# Patient Record
Sex: Female | Born: 1935 | Race: Black or African American | Hispanic: No | Smoking: Former smoker
Health system: Southern US, Community
[De-identification: ages and names within clinical notes are randomized; demographics above are authoritative.]

## PROBLEM LIST (undated history)

## (undated) DIAGNOSIS — K219 Gastro-esophageal reflux disease without esophagitis: Secondary | ICD-10-CM

## (undated) DIAGNOSIS — I4891 Unspecified atrial fibrillation: Secondary | ICD-10-CM

## (undated) DIAGNOSIS — R011 Cardiac murmur, unspecified: Secondary | ICD-10-CM

## (undated) DIAGNOSIS — E785 Hyperlipidemia, unspecified: Secondary | ICD-10-CM

## (undated) DIAGNOSIS — I10 Essential (primary) hypertension: Secondary | ICD-10-CM

## (undated) DIAGNOSIS — E43 Unspecified severe protein-calorie malnutrition: Secondary | ICD-10-CM

## (undated) DIAGNOSIS — J189 Pneumonia, unspecified organism: Secondary | ICD-10-CM

## (undated) DIAGNOSIS — I509 Heart failure, unspecified: Secondary | ICD-10-CM

## (undated) DIAGNOSIS — R0602 Shortness of breath: Secondary | ICD-10-CM

## (undated) DIAGNOSIS — I214 Non-ST elevation (NSTEMI) myocardial infarction: Secondary | ICD-10-CM

## (undated) HISTORY — PX: LEG SURGERY: SHX1003

---

## 1997-08-19 ENCOUNTER — Other Ambulatory Visit: Admission: RE | Admit: 1997-08-19 | Discharge: 1997-08-19 | Payer: Self-pay | Admitting: Family Medicine

## 1997-11-02 ENCOUNTER — Ambulatory Visit (HOSPITAL_COMMUNITY): Admission: RE | Admit: 1997-11-02 | Discharge: 1997-11-02 | Payer: Self-pay | Admitting: Internal Medicine

## 1997-12-29 ENCOUNTER — Ambulatory Visit (HOSPITAL_COMMUNITY): Admission: RE | Admit: 1997-12-29 | Discharge: 1997-12-29 | Payer: Self-pay | Admitting: Family Medicine

## 1997-12-29 ENCOUNTER — Encounter: Payer: Self-pay | Admitting: Family Medicine

## 2001-05-21 ENCOUNTER — Ambulatory Visit (HOSPITAL_COMMUNITY): Admission: RE | Admit: 2001-05-21 | Discharge: 2001-05-21 | Payer: Self-pay | Admitting: Family Medicine

## 2001-07-05 ENCOUNTER — Encounter: Payer: Self-pay | Admitting: Family Medicine

## 2001-07-05 ENCOUNTER — Ambulatory Visit (HOSPITAL_COMMUNITY): Admission: RE | Admit: 2001-07-05 | Discharge: 2001-07-05 | Payer: Self-pay | Admitting: Family Medicine

## 2001-07-18 ENCOUNTER — Ambulatory Visit (HOSPITAL_COMMUNITY): Admission: RE | Admit: 2001-07-18 | Discharge: 2001-07-18 | Payer: Self-pay | Admitting: Internal Medicine

## 2001-07-18 ENCOUNTER — Encounter: Payer: Self-pay | Admitting: Internal Medicine

## 2001-12-31 ENCOUNTER — Ambulatory Visit (HOSPITAL_COMMUNITY): Admission: RE | Admit: 2001-12-31 | Discharge: 2001-12-31 | Payer: Self-pay | Admitting: Internal Medicine

## 2001-12-31 ENCOUNTER — Encounter: Payer: Self-pay | Admitting: Internal Medicine

## 2002-02-18 ENCOUNTER — Ambulatory Visit (HOSPITAL_COMMUNITY): Admission: RE | Admit: 2002-02-18 | Discharge: 2002-02-18 | Payer: Self-pay | Admitting: Internal Medicine

## 2003-12-17 ENCOUNTER — Ambulatory Visit: Payer: Self-pay | Admitting: Internal Medicine

## 2004-02-04 ENCOUNTER — Ambulatory Visit: Payer: Self-pay | Admitting: Family Medicine

## 2004-03-02 ENCOUNTER — Ambulatory Visit: Payer: Self-pay | Admitting: Family Medicine

## 2004-03-10 ENCOUNTER — Ambulatory Visit: Payer: Self-pay | Admitting: Family Medicine

## 2004-04-22 ENCOUNTER — Emergency Department (HOSPITAL_COMMUNITY): Admission: EM | Admit: 2004-04-22 | Discharge: 2004-04-22 | Payer: Self-pay | Admitting: Emergency Medicine

## 2004-05-30 ENCOUNTER — Ambulatory Visit: Payer: Self-pay | Admitting: Family Medicine

## 2004-06-21 ENCOUNTER — Ambulatory Visit: Payer: Self-pay | Admitting: Family Medicine

## 2004-08-25 ENCOUNTER — Ambulatory Visit: Payer: Self-pay | Admitting: Family Medicine

## 2005-02-20 ENCOUNTER — Ambulatory Visit: Payer: Self-pay | Admitting: Family Medicine

## 2005-03-22 ENCOUNTER — Ambulatory Visit: Payer: Self-pay | Admitting: Family Medicine

## 2005-05-22 ENCOUNTER — Ambulatory Visit: Payer: Self-pay | Admitting: Family Medicine

## 2005-09-20 ENCOUNTER — Ambulatory Visit: Payer: Self-pay | Admitting: Family Medicine

## 2005-10-16 ENCOUNTER — Ambulatory Visit: Payer: Self-pay | Admitting: Family Medicine

## 2007-03-06 ENCOUNTER — Other Ambulatory Visit: Admission: RE | Admit: 2007-03-06 | Discharge: 2007-03-06 | Payer: Self-pay | Admitting: Gynecology

## 2008-03-12 ENCOUNTER — Ambulatory Visit: Payer: Self-pay | Admitting: Gynecology

## 2008-09-11 ENCOUNTER — Inpatient Hospital Stay (HOSPITAL_COMMUNITY): Admission: EM | Admit: 2008-09-11 | Discharge: 2008-09-16 | Payer: Self-pay | Admitting: Emergency Medicine

## 2010-07-11 LAB — URINALYSIS, MICROSCOPIC ONLY
Glucose, UA: NEGATIVE mg/dL
Protein, ur: NEGATIVE mg/dL
Specific Gravity, Urine: 1.003 — ABNORMAL LOW (ref 1.005–1.030)
Urobilinogen, UA: 0.2 mg/dL (ref 0.0–1.0)

## 2010-07-11 LAB — COMPREHENSIVE METABOLIC PANEL
AST: 27 U/L (ref 0–37)
Albumin: 3.8 g/dL (ref 3.5–5.2)
Alkaline Phosphatase: 70 U/L (ref 39–117)
Chloride: 104 mEq/L (ref 96–112)
Creatinine, Ser: 1.78 mg/dL — ABNORMAL HIGH (ref 0.4–1.2)
GFR calc Af Amer: 33 mL/min — ABNORMAL LOW (ref >60)
Potassium: 3.6 mEq/L (ref 3.5–5.1)
Total Bilirubin: 0.5 mg/dL (ref 0.3–1.2)
Total Protein: 8.3 g/dL (ref 6.0–8.3)

## 2010-07-11 LAB — BASIC METABOLIC PANEL
BUN: 19 mg/dL (ref 6–23)
CO2: 27 mEq/L (ref 19–32)
CO2: 28 mEq/L (ref 19–32)
Calcium: 8.4 mg/dL (ref 8.4–10.5)
Calcium: 9 mg/dL (ref 8.4–10.5)
Chloride: 101 mEq/L (ref 96–112)
Chloride: 103 mEq/L (ref 96–112)
Creatinine, Ser: 0.92 mg/dL (ref 0.4–1.2)
Creatinine, Ser: 1.04 mg/dL (ref 0.4–1.2)
GFR calc Af Amer: >60 mL/min (ref >60)
GFR calc non Af Amer: 58 mL/min — ABNORMAL LOW (ref >60)
GFR calc non Af Amer: >60 mL/min (ref >60)
Glucose, Bld: 107 mg/dL — ABNORMAL HIGH (ref 70–99)
Glucose, Bld: 116 mg/dL — ABNORMAL HIGH (ref 70–99)
Potassium: 3.8 mEq/L (ref 3.5–5.1)
Sodium: 136 mEq/L (ref 135–145)
Sodium: 138 mEq/L (ref 135–145)

## 2010-07-11 LAB — HEMOGLOBIN AND HEMATOCRIT, BLOOD
HCT: 26.5 % — ABNORMAL LOW (ref 36.0–46.0)
HCT: 26.5 % — ABNORMAL LOW (ref 36.0–46.0)
HCT: 30 % — ABNORMAL LOW (ref 36.0–46.0)
Hemoglobin: 9.2 g/dL — ABNORMAL LOW (ref 12.0–15.0)
Hemoglobin: 9.4 g/dL — ABNORMAL LOW (ref 12.0–15.0)

## 2010-07-11 LAB — URINALYSIS, ROUTINE W REFLEX MICROSCOPIC
Glucose, UA: NEGATIVE mg/dL
Ketones, ur: NEGATIVE mg/dL
Nitrite: NEGATIVE
Specific Gravity, Urine: 1.014 (ref 1.005–1.030)
pH: 6 (ref 5.0–8.0)

## 2010-07-11 LAB — PROTIME-INR
INR: 1.8 — ABNORMAL HIGH (ref 0.00–1.49)
INR: 1.3 (ref 0.00–1.49)
Prothrombin Time: 21.4 seconds — ABNORMAL HIGH (ref 11.6–15.2)
Prothrombin Time: 16.8 seconds — ABNORMAL HIGH (ref 11.6–15.2)
Prothrombin Time: 20.4 seconds — ABNORMAL HIGH (ref 11.6–15.2)

## 2010-07-11 LAB — DIFFERENTIAL
Basophils Absolute: 0 10*3/uL (ref 0.0–0.1)
Eosinophils Relative: 1 % (ref 0–5)
Lymphocytes Relative: 17 % (ref 12–46)
Monocytes Absolute: 0.5 10*3/uL (ref 0.1–1.0)
Monocytes Relative: 7 % (ref 3–12)

## 2010-07-11 LAB — TYPE AND SCREEN
ABO/RH(D): A POS
Antibody Screen: NEGATIVE

## 2010-07-11 LAB — URINE CULTURE

## 2010-07-11 LAB — FOLATE: Folate: >20 ng/mL

## 2010-07-11 LAB — CBC
MCHC: 34.4 g/dL (ref 30.0–36.0)
Platelets: 171 10*3/uL (ref 150–400)
Platelets: 215 10*3/uL (ref 150–400)
RDW: 12 % (ref 11.5–15.5)
RDW: 12.9 % (ref 11.5–15.5)
WBC: 6.9 10*3/uL (ref 4.0–10.5)

## 2010-07-11 LAB — URINE MICROSCOPIC-ADD ON

## 2010-08-16 NOTE — Consult Note (Signed)
NAMEMarland Hale  Traci, Hale             ACCOUNT NO.:  [id number]   MEDICAL RECORD NO.:  [id number]          PATIENT TYPE:  EMS   LOCATION:  ED                           FACILITY:  [facility]   PHYSICIAN:  Hollice Espy, M.D.DATE OF BIRTH:  04-24-1924   DATE OF CONSULTATION:  09/11/2008  DATE OF DISCHARGE:                                 CONSULTATION   ATTENDING PHYSICIAN:  Dr. Annell Greening, orthopedic surgery.  The patient's  PCP is Christiana Fuchs, nurse practitioner of Deboraha Sprang at [facility]  overseen by Dr. Joselyn Arrow.   REASON FOR CONSULTATION:  Medical clearance.   HISTORY OF PRESENT ILLNESS:  The patient is an 77 year old African  American female with past medical history of hypertension and GERD who  today had been lying out in the sun for some time when she says she felt  very tired. When she tried to stand up she was so weak that she fell  over landing on her left side.  She is having severe sharp pain over her  left hip area and could not move. Paramedics were called.  The patient  was brought in. X-rays were done of the left hip noting a left femoral  intertrochanteric fracture. She also had some blood work done noting a  normal white count and MCV of 103 and elevated BUN and creatinine of 27  and 1.78.  Coags were unremarkable.  With these findings it was felt the  patient needed a hip repair and Dr. Chamille Charter from orthopedic surgery who  was on call was called. He requested a hospitalist consult for medical  clearance.  When I saw the patient she was complaining of some mild left  hip pain.  She otherwise was doing okay.  She denies any headaches,  vision changes, dysphagia. No chest pain, palpitations, shortness of  breath, wheezes, cough, abdominal pain, hematuria, dysuria,  constipation, diarrhea, focal extremity numbness, weakness or pain other  than described above.   REVIEW OF SYSTEMS:  Otherwise negative.  She tells me her baseline is  that she ambulates well with a cane  and no chest pain.   PAST MEDICAL HISTORY:  Includes hypertension, hyperlipidemia and GERD.   MEDICATIONS:  She is on Zocor 40, Prilosec 20, triamterene/HCTZ 75/50, K-  Dur 10, and nifedipine 90.   ALLERGIES:  She has no known drug allergies.   SOCIAL HISTORY:  Denies any tobacco, alcohol or drug use.  Lives at  home.   FAMILY HISTORY:  Noncontributory.   PHYSICAL EXAMINATION:  VITALS:  On admission temperature 99, heart rate  96, blood pressure 141/69, respirations 18, O2 sat 98% on room air.  GENERAL:  She is alert and oriented x3 in no apparent distress.  HEENT:  Normocephalic, atraumatic.  Mucous membranes are slightly dry.  She has no carotid bruits.  HEART:  Regular rate and rhythm.  S1-S2 with  2/6 systolic ejection murmur.  LUNGS:  Clear to auscultation bilaterally.  ABDOMEN:  Soft, nontender, nondistended.  Positive bowel sounds.  EXTREMITIES:  No clubbing, cyanosis or edema.  MUSCULOSKELETAL:  I have deferred a musculoskeletal exam secondary to  her hip fracture.   LAB WORK:  White count 6.9, H and H 13 and 39, MCV of 103, platelet  count 215, 75% neutrophils.  Urinalysis pending.  Coags unremarkable.  Sodium 139, potassium 3.6, chloride 104, bicarb 23, BUN 27, creatinine  1.8, glucose 160.  LFTs are normal.   ASSESSMENT AND PLAN:  1. Hip fracture.  The patient is medically cleared for the operating      room.  2. Acute renal failure secondary to dehydration and likely from being      out in the sun in addition combined with fall. Recommend gentle IV      hydration.  3. Hypertension.  4. Gastroesophageal reflux disease.  5. Cardiac enlargement seen on x-ray, not a contraindication for      surgery.  6. Hyperlipidemia.  7. Macrocytosis. Check B12 and folate.      Hollice Espy, M.D.  Electronically Signed     SKK/MEDQ  D:  09/11/2008  T:  09/11/2008  Job:  [id number]   cc:   Veverly Fells. Kenyette Charter, M.D.  Fax: [phone]   for Dr. Joselyn Arrow. Christiana Fuchs,  NP

## 2010-08-16 NOTE — Discharge Summary (Signed)
NAMEMarland Kitchen  Traci, Hale             ACCOUNT NO.:  [id number]   MEDICAL RECORD NO.:  [id number]          PATIENT TYPE:  INP   LOCATION:  1615                         FACILITY:  [facility]   PHYSICIAN:  [name], M.D.    DATE OF BIRTH:  1924/12/14   DATE OF ADMISSION:  09/11/2008  DATE OF DISCHARGE:  09/16/2008                               DISCHARGE SUMMARY   FINAL DIAGNOSIS:  Left intertrochanteric subtrochanteric  hip fracture.   ADDITIONAL DIAGNOSIS:  1. Hypertension.  2. Hyperlipidemia.  3. Gastroesophageal reflux disease.   CONSULTATIONS:  [facility] hospitalist, Dr. Virginia Hale   OPERATIONS AND PROCEDURES:  Left Synthes intramedullary hip screw 13 x  300 mm long on September 12, 2008.   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  She had some elevation in creatinine and BUN with creatinine 1.78 and  BUN of 27 which resolved with some hydration.  After medical clearance  she was taken to the operating room on September 12, 2008 the following  morning and underwent intramedullary hip screw fixation.  Estimated  blood loss 150 cc.  Postoperatively she was on pharmacy Coumadin  protocol, weightbearing as tolerated and seen by PT and OT.  There was  some question about possible UTI with recent urinary tract infection.  Her urine showed 7 to 10 WBCs, few bacteria, too numerous to count red  cells and negative nitrite test.  Renal status returned to normal.  Hemoglobin was 9.4 postoperative.  She did have some postop anemia  secondary to intraoperative blood loss or acute blood loss anemia.  Arrangements were made for home health care.  Urine culture was obtained  which showed 60,000 E-coli.  She was placed on nitrofurantoin.  It was  recommended that her triamterene HCTZ be stopped unless her blood  pressure increased and if it did then metoprolol 25-50 mg p.o. b.i.d.  was recommended by Dr.  Dimas Hale.  INR was 1.8  on 06/16 and she  was making progress with transfers and weightbearing as tolerated with  physical therapy.  Staples in her hip need to be removed at 2 weeks  postop.  She can see Dr. Asmara Hale in 4 weeks.  Plan is for postop Coumadin  for 4 weeks from the date of her surgery which was September 12, 2008 and  then it will be stopped.   CONDITION ON DISCHARGE:  Satisfactory.      Mark C. Phala Hale, M.D.  Electronically Signed     MCY/MEDQ  D:  09/16/2008  T:  09/16/2008  Job:  [id number]

## 2010-08-16 NOTE — Op Note (Signed)
NAMEMarland Kitchen  Traci Hale, Traci Hale             ACCOUNT NO.:  [id number]   MEDICAL RECORD NO.:  [id number]          PATIENT TYPE:  INP   LOCATION:  1534                         [facility]ACILITY:  [facility]   PHYSICIAN:  Mark C. Keeley Charter, M.D.    DATE OF BIRTH:  Aug 02, 1924   DATE OF PROCEDURE:  09/12/2008  DATE OF DISCHARGE:                               OPERATIVE REPORT   PREOPERATIVE DIAGNOSIS:  Left intertrochanteric subtrochanteric  fracture.   POSTOPERATIVE DIAGNOSIS:  Left intertrochanteric subtrochanteric hip  fracture.   PROCEDURE:  Left intramedullary hip screw Synthes 18 x 300 mm with 100  mm locked lag screw.   SURGEON:  Eldred Manges, MD   ANESTHESIA:  GET 150 mL plus Marcaine local 10 mL.   DRAINS:  None.   PROCEDURE IN DETAIL:  Induction general anesthesia, orotracheal  intubation, preoperative Ancef prophylaxis 1 gram, surgical time-out  procedure once the patient was positioned on the fracture table with the  left lower extremity distracted internal rotation for lateralization of  the trochanter, post and well leg holder appropriately positioned with  careful padding over the peroneal nerve, reduction was performed with  traction and internal rotation, checked under fluoro where alignment was  near anatomic.  Area was prepped with DuraPrep, squared with towels,  skin stapler and drapes only and large shower curtain, Betadine bio  drape.  Surgical checklist was completed prior to incision.  Incision  was made starting at the greater trochanter extending proximally.  Gluteus medius fascia was split.  Palpation of the piriformis fossa.  Placement of the tip over the guide pin and the trochanter, checked  under fluoroscopy and then started.  Over reaming measurement prior to  prepping and draping with a ruler overlying the thigh selecting a 300 mm  length.  Rod was inserted.  Some lateral pressure mid femur had to be  applied to help with alignment in order to get the rod to cross the  fracture site since it kept hanging up on the cortex.  It was passed  distally down to the knee even with the midportion of the patella.  With  appropriate position on AP x-ray lateral attachment was placed.  Small  incision was made laterally in the skin and iliotibial band and then  screw tightened until it was against the lateral cortex.  K-wire was  drilled up center center within 3 mm of the center center position,  measured 105, 100 mm screw was selected.  Lateral cortex drilled, screw  placed, tapped into place and then locked down.  The side arm was  removed.  Spot films were taken at the conclusion, confirming excellent  position and alignment, after irrigation with saline solution both  incisions tensor fascia was closed with #1 Vicryl, 2-0 Vicryl  subcutaneous tissue, Marcaine infiltration.  Skin stapled closure,  postop dressing and transferred to the recovery room.  Instrument count  and needle count was correct.  Surgical time-out was completed prior to  the patient leaving the room.  There were no equipment problems.     Mark C. Danely Charter, M.D.  Electronically Signed  MCY/MEDQ  D:  09/12/2008  T:  09/12/2008  Job:  [id number]

## 2010-09-10 ENCOUNTER — Other Ambulatory Visit: Payer: Self-pay | Admitting: Family Medicine

## 2010-09-12 NOTE — Telephone Encounter (Signed)
This request was in the pool. No chart found, no appt scheduled. 

## 2011-03-24 ENCOUNTER — Other Ambulatory Visit: Payer: Self-pay | Admitting: Family Medicine

## 2011-03-24 DIAGNOSIS — R0989 Other specified symptoms and signs involving the circulatory and respiratory systems: Secondary | ICD-10-CM

## 2011-03-24 DIAGNOSIS — R42 Dizziness and giddiness: Secondary | ICD-10-CM

## 2011-03-24 DIAGNOSIS — E785 Hyperlipidemia, unspecified: Secondary | ICD-10-CM

## 2011-11-06 ENCOUNTER — Other Ambulatory Visit: Payer: Self-pay | Admitting: Family Medicine

## 2011-11-06 DIAGNOSIS — I639 Cerebral infarction, unspecified: Secondary | ICD-10-CM

## 2012-03-25 ENCOUNTER — Other Ambulatory Visit (HOSPITAL_COMMUNITY)
Admission: RE | Admit: 2012-03-25 | Discharge: 2012-03-25 | Disposition: A | Discharge status: Home / Self Care | Payer: Medicare Other | Source: Ambulatory Visit | Attending: Family Medicine | Admitting: Family Medicine

## 2012-03-25 DIAGNOSIS — Z124 Encounter for screening for malignant neoplasm of cervix: Secondary | ICD-10-CM | POA: Insufficient documentation

## 2014-01-08 ENCOUNTER — Encounter (HOSPITAL_COMMUNITY): Payer: Self-pay | Admitting: Emergency Medicine

## 2014-01-08 ENCOUNTER — Inpatient Hospital Stay (HOSPITAL_COMMUNITY)
Admission: EM | Admit: 2014-01-08 | Discharge: 2014-01-10 | DRG: 292 | Disposition: A | Discharge status: Home Health | Payer: Medicare HMO | Attending: Internal Medicine | Admitting: Internal Medicine

## 2014-01-08 ENCOUNTER — Emergency Department (HOSPITAL_COMMUNITY): Payer: Medicare HMO

## 2014-01-08 DIAGNOSIS — I129 Hypertensive chronic kidney disease with stage 1 through stage 4 chronic kidney disease, or unspecified chronic kidney disease: Secondary | ICD-10-CM | POA: Diagnosis present

## 2014-01-08 DIAGNOSIS — Z66 Do not resuscitate: Secondary | ICD-10-CM | POA: Diagnosis present

## 2014-01-08 DIAGNOSIS — Z79899 Other long term (current) drug therapy: Secondary | ICD-10-CM | POA: Diagnosis not present

## 2014-01-08 DIAGNOSIS — I248 Other forms of acute ischemic heart disease: Secondary | ICD-10-CM | POA: Diagnosis present

## 2014-01-08 DIAGNOSIS — M19049 Primary osteoarthritis, unspecified hand: Secondary | ICD-10-CM | POA: Diagnosis present

## 2014-01-08 DIAGNOSIS — E876 Hypokalemia: Secondary | ICD-10-CM | POA: Diagnosis present

## 2014-01-08 DIAGNOSIS — E785 Hyperlipidemia, unspecified: Secondary | ICD-10-CM | POA: Diagnosis present

## 2014-01-08 DIAGNOSIS — R0602 Shortness of breath: Secondary | ICD-10-CM | POA: Diagnosis not present

## 2014-01-08 DIAGNOSIS — I509 Heart failure, unspecified: Secondary | ICD-10-CM

## 2014-01-08 DIAGNOSIS — Z87891 Personal history of nicotine dependence: Secondary | ICD-10-CM

## 2014-01-08 DIAGNOSIS — N183 Chronic kidney disease, stage 3 unspecified: Secondary | ICD-10-CM

## 2014-01-08 DIAGNOSIS — I5043 Acute on chronic combined systolic (congestive) and diastolic (congestive) heart failure: Principal | ICD-10-CM | POA: Diagnosis present

## 2014-01-08 DIAGNOSIS — R5381 Other malaise: Secondary | ICD-10-CM

## 2014-01-08 DIAGNOSIS — I10 Essential (primary) hypertension: Secondary | ICD-10-CM | POA: Diagnosis present

## 2014-01-08 HISTORY — DX: Shortness of breath: R06.02

## 2014-01-08 HISTORY — DX: Heart failure, unspecified: I50.9

## 2014-01-08 HISTORY — DX: Gastro-esophageal reflux disease without esophagitis: K21.9

## 2014-01-08 HISTORY — DX: Essential (primary) hypertension: I10

## 2014-01-08 HISTORY — DX: Hyperlipidemia, unspecified: E78.5

## 2014-01-08 HISTORY — DX: Cardiac murmur, unspecified: R01.1

## 2014-01-08 LAB — CBC
HEMATOCRIT: 32.4 % — AB (ref 36.0–46.0)
HEMATOCRIT: 36.8 % (ref 36.0–46.0)
HEMOGLOBIN: 12.6 g/dL (ref 12.0–15.0)
Hemoglobin: 10.9 g/dL — ABNORMAL LOW (ref 12.0–15.0)
MCH: 33.5 pg (ref 26.0–34.0)
MCH: 34.1 pg — ABNORMAL HIGH (ref 26.0–34.0)
MCHC: 33.6 g/dL (ref 30.0–36.0)
MCHC: 34.2 g/dL (ref 30.0–36.0)
MCV: 99.7 fL (ref 78.0–100.0)
MCV: 99.5 fL (ref 78.0–100.0)
Platelets: 327 10*3/uL (ref 150–400)
Platelets: 267 10*3/uL (ref 150–400)
RBC: 3.25 MIL/uL — AB (ref 3.87–5.11)
RBC: 3.7 MIL/uL — ABNORMAL LOW (ref 3.87–5.11)
RDW: 13.5 % (ref 11.5–15.5)
RDW: 13.4 % (ref 11.5–15.5)
WBC: 4.2 10*3/uL (ref 4.0–10.5)
WBC: 3 10*3/uL — ABNORMAL LOW (ref 4.0–10.5)

## 2014-01-08 LAB — BASIC METABOLIC PANEL
Anion gap: 14 (ref 5–15)
BUN: 13 mg/dL (ref 6–23)
CHLORIDE: 105 meq/L (ref 96–112)
CO2: 24 meq/L (ref 19–32)
CREATININE: 0.73 mg/dL (ref 0.50–1.10)
Calcium: 9.7 mg/dL (ref 8.4–10.5)
GFR calc Af Amer: 87 mL/min — ABNORMAL LOW (ref >90)
GFR calc non Af Amer: 75 mL/min — ABNORMAL LOW (ref >90)
Glucose, Bld: 97 mg/dL (ref 70–99)
Potassium: 3.6 mEq/L — ABNORMAL LOW (ref 3.7–5.3)
Sodium: 143 mEq/L (ref 137–147)

## 2014-01-08 LAB — CREATININE, SERUM
Creatinine, Ser: 0.71 mg/dL (ref 0.50–1.10)
GFR calc non Af Amer: 75 mL/min — ABNORMAL LOW (ref >90)
GFR, EST AFRICAN AMERICAN: 87 mL/min — AB (ref >90)

## 2014-01-08 LAB — PRO B NATRIURETIC PEPTIDE: Pro B Natriuretic peptide (BNP): 18570 pg/mL — ABNORMAL HIGH (ref 0–450)

## 2014-01-08 LAB — TROPONIN I: Troponin I: <0.3 ng/mL (ref <0.30)

## 2014-01-08 LAB — I-STAT TROPONIN, ED: Troponin i, poc: 0.06 ng/mL (ref 0.00–0.08)

## 2014-01-08 MED ORDER — ASPIRIN EC 81 MG PO TBEC
81.0000 mg | DELAYED_RELEASE_TABLET | Freq: Every day | ORAL | Status: DC
  Administered 2014-01-08 – 2014-01-10 (×3): 81 mg via ORAL
  Filled 2014-01-08 (×3): qty 1

## 2014-01-08 MED ORDER — ACETAMINOPHEN 325 MG PO TABS: 650.0000 mg | ORAL_TABLET | ORAL | Status: DC | PRN

## 2014-01-08 MED ORDER — SODIUM CHLORIDE 0.9 % IJ SOLN
3.0000 mL | INTRAMUSCULAR | Status: DC | PRN
Start: 2014-01-08 — End: 2014-01-10

## 2014-01-08 MED ORDER — HEPARIN SODIUM (PORCINE) 5000 UNIT/ML IJ SOLN
5000.0000 [IU] | Freq: Three times a day (TID) | INTRAMUSCULAR | Status: DC
  Administered 2014-01-08 – 2014-01-10 (×4): 5000 [IU] via SUBCUTANEOUS
  Filled 2014-01-08 (×7): qty 1

## 2014-01-08 MED ORDER — FUROSEMIDE 10 MG/ML IJ SOLN
20.0000 mg | Freq: Once | INTRAMUSCULAR | Status: AC
  Administered 2014-01-08: 20 mg via INTRAVENOUS
  Filled 2014-01-08: qty 2

## 2014-01-08 MED ORDER — SIMVASTATIN 20 MG PO TABS
20.0000 mg | ORAL_TABLET | Freq: Every day | ORAL | Status: DC
  Administered 2014-01-08 – 2014-01-09 (×2): 20 mg via ORAL
  Filled 2014-01-08 (×3): qty 1

## 2014-01-08 MED ORDER — SODIUM CHLORIDE 0.9 % IV SOLN
250.0000 mL | INTRAVENOUS | Status: DC | PRN
Start: 2014-01-08 — End: 2014-01-10

## 2014-01-08 MED ORDER — ONDANSETRON HCL 4 MG/2ML IJ SOLN: 4.0000 mg | Freq: Four times a day (QID) | INTRAMUSCULAR | Status: DC | PRN

## 2014-01-08 MED ORDER — FA-PYRIDOXINE-CYANOCOBALAMIN 2.5-25-2 MG PO TABS
1.0000 | ORAL_TABLET | Freq: Every day | ORAL | Status: DC
  Administered 2014-01-08 – 2014-01-10 (×3): 1 via ORAL
  Filled 2014-01-08 (×3): qty 1

## 2014-01-08 MED ORDER — FOLIC ACID-VIT B6-VIT B12 0.8-10-0.115 MG PO TABS: 1.0000 | ORAL_TABLET | Freq: Every day | ORAL | Status: DC

## 2014-01-08 MED ORDER — SODIUM CHLORIDE 0.9 % IJ SOLN
3.0000 mL | Freq: Two times a day (BID) | INTRAMUSCULAR | Status: DC
  Administered 2014-01-08 – 2014-01-10 (×5): 3 mL via INTRAVENOUS
  Filled 2014-01-08: qty 3

## 2014-01-08 MED ORDER — POTASSIUM CHLORIDE CRYS ER 20 MEQ PO TBCR
40.0000 meq | EXTENDED_RELEASE_TABLET | ORAL | Status: AC
  Administered 2014-01-08 (×2): 40 meq via ORAL
  Filled 2014-01-08 (×3): qty 2

## 2014-01-08 MED ORDER — NIFEDIPINE ER OSMOTIC RELEASE 90 MG PO TB24
90.0000 mg | ORAL_TABLET | Freq: Every day | ORAL | Status: DC
  Administered 2014-01-09: 90 mg via ORAL
  Filled 2014-01-08: qty 1

## 2014-01-08 NOTE — ED Provider Notes (Signed)
CSN: [id number]     Arrival date & time 01/08/14  1114 History   First MD Initiated Contact with Patient 01/08/14 1200     Chief Complaint  Patient presents with  . Shortness of Breath     (Consider location/radiation/quality/duration/timing/severity/associated sxs/prior Treatment) HPI Comments: Patient with history of hypertension and hypercholesterolemia presents with complaint of intermittent shortness of breath that began yesterday. Shortness of breath occurs at rest and with activity. Patient denies any chest pain but family member states yesterday she complained of 'a catch' in her chest. She refused to come to ED at that time. No cough or fever. No lower extremity edema, history of congestive heart failure, new orthopnea. Patient went to her primary care physician today and had an abnormal EKG so she was sent to the emergency department. No other complaints at this time. Patient is currently asymptomatic. No history of heart disease. Family member is uncertain if patient has had a stress test.   Patient is a 80 y.o. female presenting with shortness of breath. The history is provided by the patient and a relative.  Shortness of Breath Associated symptoms: no abdominal pain, no chest pain, no cough, no fever, no headaches, no rash, no sore throat and no vomiting     Past Medical History  Diagnosis Date  . Hypertension    Past Surgical History  Procedure Laterality Date  . Leg surgery     No family history on file. History  Substance Use Topics  . Smoking status: Former Games developer  . Smokeless tobacco: Not on file  . Alcohol Use: Yes   OB History   Grav Para Term Preterm Abortions TAB SAB Ect Mult Living                 Review of Systems  Constitutional: Negative for fever.  HENT: Negative for rhinorrhea and sore throat.   Eyes: Negative for redness.  Respiratory: Positive for shortness of breath. Negative for cough.   Cardiovascular: Negative for chest pain and leg  swelling.  Gastrointestinal: Negative for nausea, vomiting, abdominal pain and diarrhea.  Genitourinary: Negative for dysuria.  Musculoskeletal: Negative for myalgias.  Skin: Negative for rash.  Neurological: Negative for headaches.      Allergies  Review of patient's allergies indicates no known allergies.  Home Medications   Prior to Admission medications   Medication Sig Start Date End Date Taking? Authorizing Provider  Calcium Carbonate-Vitamin D (CALCIUM 600+D) 600-200 MG-UNIT TABS Take 1 tablet by mouth 2 (two) times daily.   Yes Historical Provider, MD  Cholecalciferol (VITAMIN D-3) 5000 UNITS TABS Take 1 tablet by mouth daily.   Yes Historical Provider, MD  Coenzyme Q10 (CO Q 10) 60 MG CAPS Take 60 mg by mouth daily.   Yes Historical Provider, MD  Folic Acid-Vit B6-Vit B12 (FOLGARD) 0.8-10-0.115 MG TABS Take 1 tablet by mouth daily.   Yes Historical Provider, MD  NIFEdipine (PROCARDIA XL/ADALAT-CC) 90 MG 24 hr tablet Take 90 mg by mouth daily.   Yes Historical Provider, MD  Omega 3-6-9 Fatty Acids (OMEGA-3 FUSION) LIQD Take 5 mLs by mouth daily. 1000mg  omega 3 liquid   Yes Historical Provider, MD  Probiotic Product (PROBIOTIC DAILY PO) Take 1 capsule by mouth daily.   Yes Historical Provider, MD  simvastatin (ZOCOR) 20 MG tablet Take 20 mg by mouth daily.   Yes Historical Provider, MD   BP 136/85  Pulse 92  Temp(Src) 98 F (36.7 C) (Oral)  Resp 18  SpO2 99%  Physical Exam  Nursing note and vitals reviewed. Constitutional: She appears well-developed and well-nourished.  HENT:  Head: Normocephalic and atraumatic.  Mouth/Throat: Oropharynx is clear and moist and mucous membranes are normal. Mucous membranes are not dry.  Eyes: Conjunctivae are normal.  Neck: Trachea normal and normal range of motion. Neck supple. JVD (approx 2 cm sitting) present. Normal carotid pulses present. No muscular tenderness present. Carotid bruit is not present. No tracheal deviation present.   Cardiovascular: Normal rate, regular rhythm, S1 normal, S2 normal and intact distal pulses.  Exam reveals no decreased pulses.   Murmur (II/VI systolic murmur inferior LSB) heard. Pulmonary/Chest: Effort normal. No respiratory distress. She has no wheezes. She exhibits no tenderness.  Abdominal: Soft. Normal aorta and bowel sounds are normal. There is no tenderness. There is no rebound and no guarding.  Musculoskeletal: Normal range of motion.  Neurological: She is alert.  Skin: Skin is warm and dry. She is not diaphoretic. No cyanosis. No pallor.  Psychiatric: She has a normal mood and affect.    ED Course  Procedures (including critical care time) Labs Review Labs Reviewed  CBC - Abnormal; Notable for the following:    RBC 3.25 (*)    Hemoglobin 10.9 (*)    HCT 32.4 (*)    All other components within normal limits  BASIC METABOLIC PANEL - Abnormal; Notable for the following:    Potassium 3.6 (*)    GFR calc non Af Amer 75 (*)    GFR calc Af Amer 87 (*)    All other components within normal limits  PRO B NATRIURETIC PEPTIDE - Abnormal; Notable for the following:    Pro B Natriuretic peptide (BNP) 18570.0 (*)    All other components within normal limits  I-STAT TROPOININ, ED    Imaging Review Dg Chest 2 View  01/08/2014   CLINICAL DATA:  Shortness of Breath, persistent  EXAM: CHEST  2 VIEW  COMPARISON:  September 11, 2008  FINDINGS: There is mild cardiac enlargement with pulmonary venous hypertension and trace edema. There is no consolidation. There is extensive atherosclerotic change in the aorta. There is no appreciable adenopathy. Bones are osteoporotic.  IMPRESSION: Findings felt to represent a degree of chronic congestive heart failure. No consolidation. Extensive atherosclerotic change. Bones osteoporotic.   Electronically Signed   By: Bretta BangWilliam  Woodruff M.D.   On: 01/08/2014 13:51     EKG Interpretation   Date/Time:  Thursday January 08 2014 11:19:35 EDT Ventricular Rate:   94 PR Interval:  172 QRS Duration: 76 QT Interval:  384 QTC Calculation: 480 R Axis:   86 Text Interpretation:  Normal sinus rhythm Minimal voltage criteria for  LVH, may be normal variant T wave abnormality, consider inferolateral  ischemia Prolonged QT Abnormal ECG No comparison Confirmed by Fayrene FearingJAMES  MD,  MARK ([id number]) on 01/08/2014 12:50:52 PM      12:33 PM Patient seen and examined. Work-up initiated. Medications ordered. EKG reviewed and is abnormal but no STEMI. No old for comparison.   Vital signs reviewed and are as follows: BP 136/85  Pulse 92  Temp(Src) 98 F (36.7 C) (Oral)  Resp 18  SpO2 99%  2:55 PM CXR hinted at CHF. BNP was added and is elevated. Will admit for new onset CHF.   Patient discussed with Dr. Fayrene FearingJames who has seen. Lasix 20mg  IV ordered. Delta troponin ordered.   3:08 PM Dr. Butler Denmarkizwan to see and admit.    MDM   Final diagnoses:  Acute congestive heart  failure, unspecified congestive heart failure type   Admit.     Renne Crigler, PA-C 01/08/14 [phone]

## 2014-01-08 NOTE — H&P (Signed)
[facility] History and Physical  Traci CopperOphelia G Canniff NWG:[id number]:[id number] DOB: 1924-11-12 DOA: 01/08/2014   PCP: Cain SaupeFULP, CAMMIE, MD    Chief Complaint: shortness of breath  HPI: Traci Hale is a 80 y.o. female with shortness of breath upon laying flat for the past few days. No c/o chest pain, cough or tachycardia. No ankle edema. She has felt to weak to walk around. Her granddaughter found her sitting in the chair trying to catch her breath yesterday - the patient did not want to come to the hospital. No h/o of MI. Only history is HTN and hyperlipidemia and she takes her medications regularly. She is found to have mild CHF changes on xray, significantly elevated proBNP and an abnormal EKG and is being admitted for further treatment and work up. Currently does not appear to be short of breat and has an oxygen level of 99% on room air.    General: The patient denies anorexia, fever, weight loss Cardiac: Denies chest pain, syncope, palpitations, pedal edema  Respiratory: Denies cough, shortness of breath, wheezing GI: Denies severe indigestion/heartburn, abdominal pain, nausea, vomiting, diarrhea + constipation GU: Denies hematuria, incontinence, dysuria  Musculoskeletal: has arthritis in hand Skin: Denies suspicious skin lesions Neurologic: Denies focal weakness or numbness, change in vision- feels that hand tingle all the time  Past Medical History  Diagnosis Date  . Hypertension     Past Surgical History  Procedure Laterality Date  . Leg surgery      Social History: quit smoking 15 yrs ago- occasional ETOH use Lives at home with grandaughter    No Known Allergies  No family history on file.    Prior to Admission medications   Medication Sig Start Date End Date Taking? Authorizing Provider  Calcium Carbonate-Vitamin D (CALCIUM 600+D) 600-200 MG-UNIT TABS Take 1 tablet by mouth 2 (two) times daily.   Yes Historical Provider, MD  Cholecalciferol (VITAMIN D-3) 5000 UNITS  TABS Take 1 tablet by mouth daily.   Yes Historical Provider, MD  Coenzyme Q10 (CO Q 10) 60 MG CAPS Take 60 mg by mouth daily.   Yes Historical Provider, MD  Folic Acid-Vit B6-Vit B12 (FOLGARD) 0.8-10-0.115 MG TABS Take 1 tablet by mouth daily.   Yes Historical Provider, MD  NIFEdipine (PROCARDIA XL/ADALAT-CC) 90 MG 24 hr tablet Take 90 mg by mouth daily.   Yes Historical Provider, MD  Omega 3-6-9 Fatty Acids (OMEGA-3 FUSION) LIQD Take 5 mLs by mouth daily. 1000mg  omega 3 liquid   Yes Historical Provider, MD  Probiotic Product (PROBIOTIC DAILY PO) Take 1 capsule by mouth daily.   Yes Historical Provider, MD  simvastatin (ZOCOR) 20 MG tablet Take 20 mg by mouth daily.   Yes Historical Provider, MD     Physical Exam: Filed Vitals:   01/08/14 1300 01/08/14 1315 01/08/14 1330 01/08/14 1445  BP: 128/79 132/68 137/77 109/74  Pulse: 78 80 87 81  Temp:      TempSrc:      Resp: 18 23 18    SpO2: 99% 100% 97% 98%     General: AAO x 3, no distress HEENT: Normocephalic and Atraumatic, Mucous membranes pink                PERRLA; EOM intact; No scleral icterus,                 Nares: Patent, Oropharynx: Clear, Fair Dentition                 Neck: FROM, no  cervical lymphadenopathy, thyromegaly, carotid bruit or JVD;  Breasts: deferred CHEST WALL: No tenderness  CHEST: Normal respiration, mild crackles at bases bilaterally  HEART: Regular rate and rhythm; no murmurs rubs or gallops  BACK: No kyphosis or scoliosis; no CVA tenderness  ABDOMEN: Positive Bowel Sounds, soft, non-tender; no masses, no organomegaly Rectal Exam: deferred EXTREMITIES: No cyanosis, clubbing, or edema Genitalia: not examined  SKIN:  no rash or ulceration  CNS: Alert and Oriented x 4, Nonfocal exam, CN 2-12 intact  Labs on Admission:  Basic Metabolic Panel:  Recent Labs Lab 01/08/14 1230  NA 143  K 3.6*  CL 105  CO2 24  GLUCOSE 97  BUN 13  CREATININE 0.73  CALCIUM 9.7   Liver Function Tests: No results  found for this basename: AST, ALT, ALKPHOS, BILITOT, PROT, ALBUMIN,  in the last 168 hours No results found for this basename: LIPASE, AMYLASE,  in the last 168 hours No results found for this basename: AMMONIA,  in the last 168 hours CBC:  Recent Labs Lab 01/08/14 1230  WBC 4.2  HGB 10.9*  HCT 32.4*  MCV 99.7  PLT 327   Cardiac Enzymes: No results found for this basename: CKTOTAL, CKMB, CKMBINDEX, TROPONINI,  in the last 168 hours  BNP (last 3 results)  Recent Labs  01/08/14 1401  PROBNP 18570.0*   CBG: No results found for this basename: GLUCAP,  in the last 168 hours  Radiological Exams on Admission: Dg Chest 2 View  01/08/2014   CLINICAL DATA:  Shortness of Breath, persistent  EXAM: CHEST  2 VIEW  COMPARISON:  September 11, 2008  FINDINGS: There is mild cardiac enlargement with pulmonary venous hypertension and trace edema. There is no consolidation. There is extensive atherosclerotic change in the aorta. There is no appreciable adenopathy. Bones are osteoporotic.  IMPRESSION: Findings felt to represent a degree of chronic congestive heart failure. No consolidation. Extensive atherosclerotic change. Bones osteoporotic.   Electronically Signed   By: Bretta Bang M.D.   On: 01/08/2014 13:51    EKG: Independently reviewed. Changes of LVH with sinus rhythm  Assessment/Plan Principal Problem:   CHF (congestive heart failure)- unspecified - with orthopnea- will need to check pulse ox with ambulation and nocturnal oximetry (while laying flat) - will obtain ECHO- given one dose of Lasix in ER- f/u BNP in AM - f/u troponins  Active Problems:   HTN (hypertension) -cont CCB  Hypokalemia - replace K    Hyperlipidemia - cont statin   Consulted: none  Code Status: *DNR Family Communication: grandaughter Meredith Leeds  DVT Prophylaxis: Heparin  Time spent: 45 min  Myan Suit, MD [facility]  If 7PM-7AM, please contact  night-coverage [email or web address] 01/08/2014, 3:32 PM

## 2014-01-08 NOTE — ED Notes (Signed)
Patient states went to her regular doctor today and her EKG was abnormal so they sent her here.   Patient complains of SOB. Patient able to talk in full sentences without difficulty.   No distress noted at this time.

## 2014-01-08 NOTE — Progress Notes (Signed)
Patient Saturations on Room Air at Rest = 99%, HR 85  Patient Saturations on Room Air while Ambulating = 97%, HR 105  Pt walked to nurses station with RN, pt complained of dizziness. Pt placed in wheelchair, BP 153/87. Pt placed back in bed, dizziness resolved. Pt on continuous pulse ox.

## 2014-01-08 NOTE — H&P (Signed)
[facility] History and Physical  Traci Hale NWG:[id number] DOB: 1924-11-12 DOA: 01/08/2014   PCP: Cain SaupeFULP, CAMMIE, MD    Chief Complaint: shortness of breath  HPI: Traci Hale is a 80 y.o. female with shortness of breath upon laying flat for the past few days. No c/o chest pain, cough or tachycardia. No ankle edema. She has felt to weak to walk around. Her granddaughter found her sitting in the chair trying to catch her breath yesterday - the patient did not want to come to the hospital. No h/o of MI. Only history is HTN and hyperlipidemia and she takes her medications regularly. She is found to have mild CHF changes on xray, significantly elevated proBNP and an abnormal EKG and is being admitted for further treatment and work up. Currently does not appear to be short of breat and has an oxygen level of 99% on room air.    General: The patient denies anorexia, fever, weight loss Cardiac: Denies chest pain, syncope, palpitations, pedal edema  Respiratory: Denies cough, shortness of breath, wheezing GI: Denies severe indigestion/heartburn, abdominal pain, nausea, vomiting, diarrhea + constipation GU: Denies hematuria, incontinence, dysuria  Musculoskeletal: has arthritis in hand Skin: Denies suspicious skin lesions Neurologic: Denies focal weakness or numbness, change in vision- feels that hand tingle all the time  Past Medical History  Diagnosis Date  . Hypertension     Past Surgical History  Procedure Laterality Date  . Leg surgery      Social History: quit smoking 15 yrs ago- occasional ETOH use Lives at home with grandaughter    No Known Allergies  No family history on file.    Prior to Admission medications   Medication Sig Start Date End Date Taking? Authorizing Provider  Calcium Carbonate-Vitamin D (CALCIUM 600+D) 600-200 MG-UNIT TABS Take 1 tablet by mouth 2 (two) times daily.   Yes Historical Provider, MD  Cholecalciferol (VITAMIN D-3) 5000 UNITS  TABS Take 1 tablet by mouth daily.   Yes Historical Provider, MD  Coenzyme Q10 (CO Q 10) 60 MG CAPS Take 60 mg by mouth daily.   Yes Historical Provider, MD  Folic Acid-Vit B6-Vit B12 (FOLGARD) 0.8-10-0.115 MG TABS Take 1 tablet by mouth daily.   Yes Historical Provider, MD  NIFEdipine (PROCARDIA XL/ADALAT-CC) 90 MG 24 hr tablet Take 90 mg by mouth daily.   Yes Historical Provider, MD  Omega 3-6-9 Fatty Acids (OMEGA-3 FUSION) LIQD Take 5 mLs by mouth daily. 1000mg  omega 3 liquid   Yes Historical Provider, MD  Probiotic Product (PROBIOTIC DAILY PO) Take 1 capsule by mouth daily.   Yes Historical Provider, MD  simvastatin (ZOCOR) 20 MG tablet Take 20 mg by mouth daily.   Yes Historical Provider, MD     Physical Exam: Filed Vitals:   01/08/14 1300 01/08/14 1315 01/08/14 1330 01/08/14 1445  BP: 128/79 132/68 137/77 109/74  Pulse: 78 80 87 81  Temp:      TempSrc:      Resp: 18 23 18    SpO2: 99% 100% 97% 98%     General: AAO x 3, no distress HEENT: Normocephalic and Atraumatic, Mucous membranes pink                PERRLA; EOM intact; No scleral icterus,                 Nares: Patent, Oropharynx: Clear, Fair Dentition                 Neck: FROM, no  cervical lymphadenopathy, thyromegaly, carotid bruit or JVD;  Breasts: deferred CHEST WALL: No tenderness  CHEST: Normal respiration, mild crackles at bases bilaterally  HEART: Regular rate and rhythm; no murmurs rubs or gallops  BACK: No kyphosis or scoliosis; no CVA tenderness  ABDOMEN: Positive Bowel Sounds, soft, non-tender; no masses, no organomegaly Rectal Exam: deferred EXTREMITIES: No cyanosis, clubbing, or edema Genitalia: not examined  SKIN:  no rash or ulceration  CNS: Alert and Oriented x 4, Nonfocal exam, CN 2-12 intact  Labs on Admission:  Basic Metabolic Panel:  Recent Labs Lab 01/08/14 1230  NA 143  K 3.6*  CL 105  CO2 24  GLUCOSE 97  BUN 13  CREATININE 0.73  CALCIUM 9.7   Liver Function Tests: No results  found for this basename: AST, ALT, ALKPHOS, BILITOT, PROT, ALBUMIN,  in the last 168 hours No results found for this basename: LIPASE, AMYLASE,  in the last 168 hours No results found for this basename: AMMONIA,  in the last 168 hours CBC:  Recent Labs Lab 01/08/14 1230  WBC 4.2  HGB 10.9*  HCT 32.4*  MCV 99.7  PLT 327   Cardiac Enzymes: No results found for this basename: CKTOTAL, CKMB, CKMBINDEX, TROPONINI,  in the last 168 hours  BNP (last 3 results)  Recent Labs  01/08/14 1401  PROBNP 18570.0*   CBG: No results found for this basename: GLUCAP,  in the last 168 hours  Radiological Exams on Admission: Dg Chest 2 View  01/08/2014   CLINICAL DATA:  Shortness of Breath, persistent  EXAM: CHEST  2 VIEW  COMPARISON:  September 11, 2008  FINDINGS: There is mild cardiac enlargement with pulmonary venous hypertension and trace edema. There is no consolidation. There is extensive atherosclerotic change in the aorta. There is no appreciable adenopathy. Bones are osteoporotic.  IMPRESSION: Findings felt to represent a degree of chronic congestive heart failure. No consolidation. Extensive atherosclerotic change. Bones osteoporotic.   Electronically Signed   By: Bretta BangWilliam  Woodruff M.D.   On: 01/08/2014 13:51    EKG: Independently reviewed. Changes of LVH with sinus rhythm  Assessment/Plan Principal Problem:   CHF (congestive heart failure)- unspecified - with orthopnea- will need to check pulse ox with ambulation and nocturnal oximetry (while laying flat) - will obtain ECHO- given one dose of Lasix in ER- f/u BNP in AM - f/u troponins  Active Problems: Mild POC troponin elevation - if second set positive, will call cardiology - add ASA    HTN (hypertension) -cont CCB  Hypokalemia - replace K    Hyperlipidemia - cont statin   Consulted: none  Code Status: *DNR Family Communication: [name]  DVT Prophylaxis: Heparin  Time spent: 45 min  Amra Shukla,  MD [facility]  If 7PM-7AM, please contact night-coverage [email or web address] 01/08/2014, 3:36 PM

## 2014-01-09 DIAGNOSIS — E785 Hyperlipidemia, unspecified: Secondary | ICD-10-CM

## 2014-01-09 DIAGNOSIS — I509 Heart failure, unspecified: Secondary | ICD-10-CM

## 2014-01-09 DIAGNOSIS — I059 Rheumatic mitral valve disease, unspecified: Secondary | ICD-10-CM

## 2014-01-09 DIAGNOSIS — I5043 Acute on chronic combined systolic (congestive) and diastolic (congestive) heart failure: Principal | ICD-10-CM

## 2014-01-09 DIAGNOSIS — N183 Chronic kidney disease, stage 3 (moderate): Secondary | ICD-10-CM

## 2014-01-09 DIAGNOSIS — I10 Essential (primary) hypertension: Secondary | ICD-10-CM

## 2014-01-09 LAB — BASIC METABOLIC PANEL
Anion gap: 13 (ref 5–15)
BUN: 14 mg/dL (ref 6–23)
CHLORIDE: 106 meq/L (ref 96–112)
CO2: 26 meq/L (ref 19–32)
CREATININE: 0.75 mg/dL (ref 0.50–1.10)
Calcium: 9.1 mg/dL (ref 8.4–10.5)
GFR calc non Af Amer: 74 mL/min — ABNORMAL LOW (ref >90)
GFR, EST AFRICAN AMERICAN: 86 mL/min — AB (ref >90)
Glucose, Bld: 95 mg/dL (ref 70–99)
POTASSIUM: 4.9 meq/L (ref 3.7–5.3)
Sodium: 145 mEq/L (ref 137–147)

## 2014-01-09 LAB — TROPONIN I: Troponin I: 0.39 ng/mL (ref <0.30)

## 2014-01-09 LAB — PRO B NATRIURETIC PEPTIDE: Pro B Natriuretic peptide (BNP): 17819 pg/mL — ABNORMAL HIGH (ref 0–450)

## 2014-01-09 MED ORDER — CARVEDILOL 6.25 MG PO TABS
6.2500 mg | ORAL_TABLET | Freq: Two times a day (BID) | ORAL | Status: DC
  Administered 2014-01-09 – 2014-01-10 (×2): 6.25 mg via ORAL
  Filled 2014-01-09 (×4): qty 1

## 2014-01-09 MED ORDER — FUROSEMIDE 20 MG PO TABS
20.0000 mg | ORAL_TABLET | Freq: Every day | ORAL | Status: DC
  Administered 2014-01-09 – 2014-01-10 (×2): 20 mg via ORAL
  Filled 2014-01-09 (×2): qty 1

## 2014-01-09 MED ORDER — ENSURE COMPLETE PO LIQD
237.0000 mL | Freq: Two times a day (BID) | ORAL | Status: DC
  Administered 2014-01-09 – 2014-01-10 (×3): 237 mL via ORAL

## 2014-01-09 MED ORDER — LISINOPRIL 5 MG PO TABS
5.0000 mg | ORAL_TABLET | Freq: Every day | ORAL | Status: DC
  Administered 2014-01-09 – 2014-01-10 (×2): 5 mg via ORAL
  Filled 2014-01-09 (×2): qty 1

## 2014-01-09 NOTE — Progress Notes (Signed)
Echocardiogram 2D Echocardiogram has been performed.  [name], [name] M 01/09/2014, 10:17 AM

## 2014-01-09 NOTE — Progress Notes (Signed)
[facility] PROGRESS NOTE Interim History: 80 y.o. female with shortness of breath upon laying flat for the past few days. No c/o chest pain, cough or tachycardia. No ankle edema. She has felt to weak to walk around. Her granddaughter found her sitting in the chair trying to catch her breath yesterday - the patient did not want to come to the hospital. No h/o of MI. Only history is HTN and hyperlipidemia and she takes her medications regularly. She is found to have mild CHF changes on xray, significantly elevated proBNP and an abnormal EKG and is being admitted for further treatment and work up. Currently does not appear to be short of breat and has an oxygen level of 99% on room air.    Filed Weights   01/08/14 1654 01/09/14 0500  Weight: 50.984 kg (112 lb 6.4 oz) 49.986 kg (110 lb 3.2 oz)        Intake/Output Summary (Last 24 hours) at 01/09/14 1603 Last data filed at 01/09/14 1418  Gross per 24 hour  Intake    720 ml  Output   2210 ml  Net  -1490 ml     Assessment/Plan: 1-acute on chronic combined CHF (congestive heart failure): EF 35-40% -mild elevation of troponin w/o CP; most likely demand ischemia -troponin now back to normal -patient denies further SOB -will start coreg, lisinopril and lasix PO -follow electrolytes and renal function -daily weight, strict intake and output, low sodium diet -good urine output and approx almost pounds   2-HLD: continue statins   3-HTN (hypertension): slightly elevated -will follow BP with new drugs added  4-weakness: will ask PT to evaluate  5-chronic renal failure (stage 3): at baseline. -will monitor with diuresis and initiation of lisinopril   Code Status: DNR Family Communication: daughter at bedside Disposition Plan: home in am most likely.   Consultants:  None   Procedures: ECHO:  - Left ventricle: Inferior septal and apical hypokinesis. The cavity size was mildly dilated. Wall thickness was increased in  a pattern of mild LVH. Systolic function was moderately reduced. The estimated ejection fraction was in the range of 35% to 40%. Doppler parameters are consistent with both elevated ventricular end-diastolic filling pressure and elevated left atrial filling pressure. - Aortic valve: There was mild stenosis. There was mild regurgitation. Valve area (Vmax): 1.07 cm^2. - Mitral valve: There was mild to moderate regurgitation. - Left atrium: The atrium was moderately to severely dilated. - Atrial septum: No defect or patent foramen ovale was identified. - Pulmonary arteries: PA peak pressure: 54 mm Hg (S).  Antibiotics:  None   HPI/Subjective: Denies CP, no SOB. Patient feeling back to baseline  Objective: Filed Vitals:   01/09/14 0500 01/09/14 0523 01/09/14 1121 01/09/14 1420  BP: 157/90 142/82 164/81 136/106  Pulse: 93 79 90 95  Temp: 97.9 F (36.6 C)  98.5 F (36.9 C) 98.4 F (36.9 C)  TempSrc: Oral  Oral Oral  Resp: 18  18 18   Height:      Weight: 49.986 kg (110 lb 3.2 oz)     SpO2: 99% 100% 99% 100%     Exam: General: Alert, awake, oriented x3, in no acute distress. Frail and underweight. Breathing better.  HEENT: No bruits, no goiter. No JVD Heart: Regular rate; no rubs or gallops. No lower extremity edema Lungs: Good air movement overall, no frank crakles Abdomen: Soft, nontender, nondistended, positive bowel sounds.  Neuro: Grossly intact, nonfocal.  Data Reviewed: Basic Metabolic Panel:  Recent Labs  Lab 01/08/14 1230 01/08/14 1905 01/09/14 0040  NA 143  --  145  K 3.6*  --  4.9  CL 105  --  106  CO2 24  --  26  GLUCOSE 97  --  95  BUN 13  --  14  CREATININE 0.73 0.71 0.75  CALCIUM 9.7  --  9.1   CBC:  Recent Labs Lab 01/08/14 1230 01/08/14 1905  WBC 4.2 3.0*  HGB 10.9* 12.6  HCT 32.4* 36.8  MCV 99.7 99.5  PLT 327 267   Cardiac Enzymes:  Recent Labs Lab 01/08/14 1905 01/09/14 0040 01/09/14 0419  TROPONINI <0.30 0.39* <0.30   BNP  (last 3 results)  Recent Labs  01/08/14 1401 01/09/14 0040  PROBNP 18570.0* 17819.0*    Studies: Dg Chest 2 View  01/08/2014   CLINICAL DATA:  Shortness of Breath, persistent  EXAM: CHEST  2 VIEW  COMPARISON:  September 11, 2008  FINDINGS: There is mild cardiac enlargement with pulmonary venous hypertension and trace edema. There is no consolidation. There is extensive atherosclerotic change in the aorta. There is no appreciable adenopathy. Bones are osteoporotic.  IMPRESSION: Findings felt to represent a degree of chronic congestive heart failure. No consolidation. Extensive atherosclerotic change. Bones osteoporotic.   Electronically Signed   By: Bretta BangWilliam  Woodruff M.D.   On: 01/08/2014 13:51    Scheduled Meds: . aspirin EC  81 mg Oral Daily  . carvedilol  6.25 mg Oral BID WC  . feeding supplement (ENSURE COMPLETE)  237 mL Oral BID BM  . folic acid-pyridoxine-cyancobalamin  1 tablet Oral Daily  . furosemide  20 mg Oral Daily  . heparin  5,000 Units Subcutaneous 3 times per day  . lisinopril  5 mg Oral Daily  . simvastatin  20 mg Oral q1800  . sodium chloride  3 mL Intravenous Q12H   Continuous Infusions:   Time: > 30 minutes  Vassie LollMadera, Esten Dollar  [facility] Pager [phone]. If 8PM-8AM, please contact night-coverage at [email or web address], password [email or web address] 01/09/2014, 4:03 PM  LOS: 1 day

## 2014-01-09 NOTE — Progress Notes (Signed)
Shift Event: RN paged this PA with critical trop 0.39.  Per nurse, pt is having no CP and is sound asleep. Hasn't c/o of CP entire shift, VSS. First trop normal, second slightly inc, pt here for CHF decompensation. Likely secondary to demand ischemia given no CP or EKG changes.  Follow next trop at 6am, if further inc in trop will call Cardiology.  RN to page if pt has CP and will call Cardiology at that time.  Darcus AustinSahar Osman, PA-C [facility] [phone]

## 2014-01-09 NOTE — Care Management Note (Addendum)
    Page 1 of 2   01/11/2014     10:50:37 AM CARE MANAGEMENT NOTE 01/11/2014  Patient:  Traci Hale,Traci Hale   Account Number:  [id number]  Date Initiated:  01/09/2014  Documentation initiated by:  HUTCHINSON,CRYSTAL  Subjective/Objective Assessment:   CHF     Action/Plan:   CM to follow for disposition nededs   Anticipated DC Date:  01/10/2014   Anticipated DC Plan:  HOME W HOME HEALTH SERVICES      DC Planning Services  CM consult      [facility]   Choice offered to / List presented to:  C-4 Adult Children   DME arranged  NA      DME agency  NA     HH arranged  HH-1 RN  HH-10 DISEASE MANAGEMENT  HH-2 PT      HH agency  [facility]   Status of service:  Completed, signed off Medicare Important Message given?  NA - LOS <3 / Initial given by admissions (If response is "NO", the following Medicare IM given date fields will be blank) Date Medicare IM given:   Medicare IM given by:   Date Additional Medicare IM given:   Additional Medicare IM given by:    Discharge Disposition:  HOME W HOME HEALTH SERVICES  Per UR Regulation:  Reviewed for med. necessity/level of care/duration of stay  If discussed at Long Length of Stay Meetings, dates discussed:    Comments:  01/11/14 CM spoke with grandaughter, Kristin BruinsNicol Hairstone [phone], who takes care of her grandmother (pt) and is primary contact. Pt [facility] and would, if possible, like her again.  Address is correct.  [facility]  No DME needed.  No other CM needs were communicated. Freddy JakschSarah Peachie Barkalow, BSN, CM, [phone].   Crystal Hutchinson RN, BSN, MSHL, CCM  Nurse - Case Manager,  (Unit [phone])  [phone]  01/09/2014 Social:  From home.  Supportive Granddaughter Dispo Plan:  Home / Self care CM will continue to monitor.

## 2014-01-09 NOTE — Progress Notes (Addendum)
CRITICAL VALUE ALERT  Critical value received:  Troponin 0.39  Date of notification:  01/09/2014  Time of notification:  0139  Critical value read back:Yes.    Nurse who received alert:  D. Edwyna ShellHart, RN  MD notified (1st page):  Donnamarie PoagK Kirby   Time of first page:  01:42  MD notified (2nd page):  Time of second page:  Responding MD:  Donnamarie PoagK Kirby, and Freddi CheSahar  Time MD responded:  [phone]  Pt with no complaints of pain tonight, only shortness of breath on exertion. O2 sat greater than 95% on room air per continuous pulse ox. MD aware of BP and HR as well as dizziness with walking earlier in shift. Will continue to monitor and notify MD if pt has chest pain or increased troponin value.

## 2014-01-09 NOTE — Evaluation (Signed)
Physical Therapy Evaluation Patient Details Name: Traci Hale MRN: [id number] DOB: Sep 04, 1924 Today's Date: 01/09/2014   History of Present Illness  Admitted with symptoms of SOB on lying flat, inability to walk around much and having trouble catching her breath..  She is found to have mild CHF changes on xray, significantly elevated proBNP and an abnormal EKG and is being admitted for further treatment and work up.   Clinical Impression  Pt not moving quite as well as she would normally at home and shows some mild gait instability and dyspnea with minimal exertion. She could benefit from HHPT to build her tolerance for activity.  Will have nursing progressively ambulate pt in the hospital and will sign off at this time.    Follow Up Recommendations Home health PT;Supervision for mobility/OOB    Equipment Recommendations  None recommended by PT    Recommendations for Other Services       Precautions / Restrictions Precautions Precautions: Fall      Mobility  Bed Mobility Overal bed mobility: Needs Assistance Bed Mobility: Supine to Sit     Supine to sit: Min guard     General bed mobility comments: slow and effortful, but pt didn't want any assist; cues for hand placement to make it easier.  Transfers Overall transfer level: Needs assistance Equipment used: Rolling walker (2 wheeled) Transfers: Sit to/from Stand Sit to Stand: Min assist         General transfer comment: cues for hand placement and steady assist  Ambulation/Gait Ambulation/Gait assistance: Min guard;Min assist Ambulation Distance (Feet): 150 Feet Assistive device: Rolling walker (2 wheeled) Gait Pattern/deviations: Step-through pattern;Leaning posteriorly   Gait velocity interpretation: Below normal speed for age/gender General Gait Details: Variably steady depending on focus. Generally steady most of the time.  Min in turns.  Stairs            Wheelchair Mobility    Modified  Rankin (Stroke Patients Only)       Balance Overall balance assessment: Needs assistance   Sitting balance-Leahy Scale: Fair       Standing balance-Leahy Scale: Fair                               Pertinent Vitals/Pain Pain Assessment: Faces Faces Pain Scale: Hurts even more Pain Location: back of the neck Pain Descriptors / Indicators: Dull (grabbing like a "crick") Pain Intervention(s): Monitored during session;Heat applied;Other (comment) (moderately deep massage to painful area)    Home Living Family/patient expects to be discharged to:: Private residence Living Arrangements: Other relatives Available Help at Discharge: Family;Other (Comment) (Family work days and pt alone for long periods of time) Type of Home: House Home Access: Stairs to enter   Technical brewer of Steps: several Home Layout: One level Home Equipment: [place] - single point      Prior Function                 Hand Dominance        Extremity/Trunk Assessment               Lower Extremity Assessment: Generalized weakness;Overall [facility] for tasks assessed      Cervical / Trunk Assessment: Kyphotic  Communication      Cognition Arousal/Alertness: Awake/alert Behavior During Therapy: WFL for tasks assessed/performed Overall Cognitive Status: History of cognitive impairments - at baseline  General Comments      Exercises        Assessment/Plan    PT Assessment All further PT needs can be met in the next venue of care  PT Diagnosis Difficulty walking;Generalized weakness   PT Problem List Decreased strength;Decreased activity tolerance;Decreased balance;Decreased mobility;Decreased knowledge of use of DME  PT Treatment Interventions     PT Goals (Current goals can be found in the Care Plan section) Acute Rehab PT Goals Patient Stated Goal: Go home now PT Goal Formulation: No goals set, d/c therapy    Frequency     Barriers  to discharge        Co-evaluation               End of Session   Activity Tolerance: Patient tolerated treatment well Patient left: in chair;with family/visitor present;with call bell/phone within reach Nurse Communication: Mobility status         Time: 2244-9753 PT Time Calculation (min): 28 min   Charges:   PT Evaluation $Initial PT Evaluation Tier I: 1 Procedure PT Treatments $Gait Training: 8-22 mins   PT G Codes:          Vaniah Chambers, Tessie Fass 01/09/2014, 5:32 PM 01/09/2014  Donnella Sham, PT [phone]  (pager)

## 2014-01-10 DIAGNOSIS — R5381 Other malaise: Secondary | ICD-10-CM

## 2014-01-10 LAB — URINE MICROSCOPIC-ADD ON

## 2014-01-10 LAB — URINALYSIS, ROUTINE W REFLEX MICROSCOPIC
BILIRUBIN URINE: NEGATIVE
GLUCOSE, UA: NEGATIVE mg/dL
Ketones, ur: 15 mg/dL — AB
Nitrite: POSITIVE — AB
PROTEIN: NEGATIVE mg/dL
Specific Gravity, Urine: 1.02 (ref 1.005–1.030)
UROBILINOGEN UA: 1 mg/dL (ref 0.0–1.0)
pH: 6 (ref 5.0–8.0)

## 2014-01-10 LAB — BASIC METABOLIC PANEL
ANION GAP: 14 (ref 5–15)
BUN: 19 mg/dL (ref 6–23)
CALCIUM: 9.4 mg/dL (ref 8.4–10.5)
CO2: 25 mEq/L (ref 19–32)
CREATININE: 0.79 mg/dL (ref 0.50–1.10)
Chloride: 102 mEq/L (ref 96–112)
GFR, EST AFRICAN AMERICAN: 84 mL/min — AB (ref >90)
GFR, EST NON AFRICAN AMERICAN: 73 mL/min — AB (ref >90)
Glucose, Bld: 88 mg/dL (ref 70–99)
Potassium: 3.9 mEq/L (ref 3.7–5.3)
Sodium: 141 mEq/L (ref 137–147)

## 2014-01-10 LAB — MAGNESIUM: Magnesium: 1.8 mg/dL (ref 1.5–2.5)

## 2014-01-10 MED ORDER — FUROSEMIDE 20 MG PO TABS: 20.0000 mg | ORAL_TABLET | Freq: Every day | ORAL | Status: DC

## 2014-01-10 MED ORDER — ENSURE COMPLETE PO LIQD: 237.0000 mL | Freq: Two times a day (BID) | ORAL | Status: DC

## 2014-01-10 MED ORDER — CARVEDILOL 12.5 MG PO TABS
6.2500 mg | ORAL_TABLET | Freq: Two times a day (BID) | ORAL | Status: DC
Start: 2014-01-10 — End: 2014-06-18

## 2014-01-10 MED ORDER — LISINOPRIL 5 MG PO TABS
5.0000 mg | ORAL_TABLET | Freq: Every day | ORAL | Status: DC
Start: 2014-01-10 — End: 2014-06-18

## 2014-01-10 MED ORDER — ASPIRIN 81 MG PO TBEC: 81.0000 mg | DELAYED_RELEASE_TABLET | Freq: Every day | ORAL | Status: DC

## 2014-01-10 NOTE — Discharge Summary (Signed)
Physician Discharge Summary  Traci CopperOphelia G Hale WUJ:[id number] DOB: Sep 17, 1924 DOA: 01/08/2014  PCP: Cain SaupeFULP, CAMMIE, MD  Admit date: 01/08/2014 Discharge date: 01/10/2014  Time spent: >30 minutes  Recommendations for Outpatient Follow-up:  1. Check BMET to follow electrolytes and renal function 2. Reassess BP and adjust medications as needed 3. Will follow with heart failure team in 2 weeks  BNP    Component Value Date/Time   PROBNP 17819.0* 01/09/2014 0040   Filed Weights   01/08/14 1654 01/09/14 0500 01/10/14 0534  Weight: 50.984 kg (112 lb 6.4 oz) 49.986 kg (110 lb 3.2 oz) 49.42 kg (108 lb 15.2 oz)     Discharge Diagnoses:  Acute on chronic combined CHF (congestive heart failure) HTN (hypertension) Hyperlipidemia CKD stage 3 Weakness   Discharge Condition: stable and improved. [facility]  Diet recommendation: low sodium diet   History of present illness:  80 y.o. female with shortness of breath upon laying flat for the past few days. No c/o chest pain, cough or tachycardia. No ankle edema. She has felt to weak to walk around. Her granddaughter found her sitting in the chair trying to catch her breath yesterday - the patient did not want to come to the hospital. No h/o of MI. Only history is HTN and hyperlipidemia and she takes her medications regularly. She is found to have mild CHF changes on xray, significantly elevated proBNP and an abnormal EKG and is being admitted for further treatment and work up. Currently does not appear to be short of breat and has an oxygen level of 99% on room air.    Hospital Course:  1-acute on chronic combined CHF (congestive heart failure): EF 35-40%  -mild elevation of troponin w/o CP; most likely demand ischemia; back to normal at discharge -patient denies further SOB  -will start coreg, lisinopril and lasix PO  -follow electrolytes and renal function in outpatient setting -daily weight, fluid  restriction and low sodium diet discussed with patient -good urine output and approx almost 5-6 pounds off  2-HLD: continue statins   3-HTN (hypertension): slightly elevated  -will follow BP with new drugs added  -further adjustment to regimen to be done base on BP fluctuation  -advise to follow low sodium diet  4-weakness: following PT evaluation and recommendations, [facility]   5-chronic renal failure (stage 3): at baseline.  -will monitor closely with new initiation of lasix and lisinopril    Procedures: ECHO:  - Left ventricle: Inferior septal and apical hypokinesis. The cavity size was mildly dilated. Wall thickness was increased in a pattern of mild LVH. Systolic function was moderately reduced. The estimated ejection fraction was in the range of 35% to 40%. Doppler parameters are consistent with both elevated ventricular end-diastolic filling pressure and elevated left atrial filling pressure. - Aortic valve: There was mild stenosis. There was mild regurgitation. Valve area (Vmax): 1.07 cm^2. - Mitral valve: There was mild to moderate regurgitation. - Left atrium: The atrium was moderately to severely dilated. - Atrial septum: No defect or patent foramen ovale was identified. - Pulmonary arteries: PA peak pressure: 54 mm Hg (S).   Consultations:  Cardiology curbside (will follow patient in outpatient settings, HF clinic)  Discharge Exam: Filed Vitals:   01/10/14 0534  BP: 138/70  Pulse: 70  Temp: 98.4 F (36.9 C)  Resp: 18   General: Alert, awake, oriented x3, in no acute distress. Frail and underweight. Breathing a lot better.  HEENT:  No bruits, no goiter. No JVD  Heart: Regular rate; no rubs or gallops. No lower extremity edema  Lungs: Good air movement overall, no frank crakles  Abdomen: Soft, nontender, nondistended, positive bowel sounds.  Neuro: Grossly intact, nonfocal.  Discharge Instructions  Discharge Instructions   ACE  Inhibitor / ARB already ordered    Complete by:  As directed      Beta Blocker already ordered    Complete by:  As directed      Diet - low sodium heart healthy    Complete by:  As directed      Discharge instructions    Complete by:  As directed   Take medications as prescribed Low sodium diet (less than 2 grams daily) Fluid restriction to approx 2L in 24 hours Check your weight on daily basis Follow up with PCP in 1 week Follow up with cardiology in 2 weeks (they will call you with appointment details)     Heart Failure patients record your daily weight using the same scale at the same time of day    Complete by:  As directed      Increase activity slowly    Complete by:  As directed      STOP any activity that causes chest pain, shortness of breath, dizziness, sweating, or exessive weakness    Complete by:  As directed             Medication List    STOP taking these medications       NIFEdipine 90 MG 24 hr tablet  Commonly known as:  PROCARDIA XL/ADALAT-CC      TAKE these medications       aspirin 81 MG EC tablet  Take 1 tablet (81 mg total) by mouth daily.     CALCIUM 600+D 600-200 MG-UNIT Tabs  Generic drug:  Calcium Carbonate-Vitamin D  Take 1 tablet by mouth 2 (two) times daily.     carvedilol 12.5 MG tablet  Commonly known as:  COREG  Take 0.5 tablets (6.25 mg total) by mouth 2 (two) times daily with a meal.     Co Q 10 60 MG Caps  Take 60 mg by mouth daily.     feeding supplement (ENSURE COMPLETE) Liqd  Take 237 mLs by mouth 2 (two) times daily between meals.     FOLGARD 0.8-10-0.115 MG Tabs  Generic drug:  Folic Acid-Vit B6-Vit B12  Take 1 tablet by mouth daily.     furosemide 20 MG tablet  Commonly known as:  LASIX  Take 1 tablet (20 mg total) by mouth daily.     lisinopril 5 MG tablet  Commonly known as:  PRINIVIL,ZESTRIL  Take 1 tablet (5 mg total) by mouth daily.     OMEGA-3 FUSION Liqd  Take 5 mLs by mouth daily. 1000mg  omega 3 liquid      PROBIOTIC DAILY PO  Take 1 capsule by mouth daily.     simvastatin 20 MG tablet  Commonly known as:  ZOCOR  Take 20 mg by mouth daily.     Vitamin D-3 5000 UNITS Tabs  Take 1 tablet by mouth daily.       No Known Allergies     Follow-up Information   Follow up with FULP, CAMMIE, MD. Schedule an appointment as soon as possible for a visit in 1 week.   Specialty:  Family Medicine   Contact information:   [address] [place] [id number] [phone]  The results of significant diagnostics from this hospitalization (including imaging, microbiology, ancillary and laboratory) are listed below for reference.    Significant Diagnostic Studies: Dg Chest 2 View  01/08/2014   CLINICAL DATA:  Shortness of Breath, persistent  EXAM: CHEST  2 VIEW  COMPARISON:  September 11, 2008  FINDINGS: There is mild cardiac enlargement with pulmonary venous hypertension and trace edema. There is no consolidation. There is extensive atherosclerotic change in the aorta. There is no appreciable adenopathy. Bones are osteoporotic.  IMPRESSION: Findings felt to represent a degree of chronic congestive heart failure. No consolidation. Extensive atherosclerotic change. Bones osteoporotic.   Electronically Signed   By: Bretta Bang M.D.   On: 01/08/2014 13:51   Labs: Basic Metabolic Panel:  Recent Labs Lab 01/08/14 1230 01/08/14 1905 01/09/14 0040 01/09/14 2322 01/10/14 0335  NA 143  --  145  --  141  K 3.6*  --  4.9  --  3.9  CL 105  --  106  --  102  CO2 24  --  26  --  25  GLUCOSE 97  --  95  --  88  BUN 13  --  14  --  19  CREATININE 0.73 0.71 0.75  --  0.79  CALCIUM 9.7  --  9.1  --  9.4  MG  --   --   --  1.8  --    CBC:  Recent Labs Lab 01/08/14 1230 01/08/14 1905  WBC 4.2 3.0*  HGB 10.9* 12.6  HCT 32.4* 36.8  MCV 99.7 99.5  PLT 327 267   Cardiac Enzymes:  Recent Labs Lab 01/08/14 1905 01/09/14 0040 01/09/14 0419  TROPONINI <0.30 0.39* <0.30    BNP: BNP (last 3 results)  Recent Labs  01/08/14 1401 01/09/14 0040  PROBNP 18570.0* 17819.0*    Signed:  Vassie Loll  [facility] 01/10/2014, 12:47 PM

## 2014-01-10 NOTE — Progress Notes (Signed)
Patient ID: Traci CopperOphelia G Hale, female   DOB: 1924/06/17, 80 y.o.   MRN: [id number] Patient alertx4. Vitals signs stable. No complaints of pain. Discharge instructions reviewed with patient and family. Verbalized understanding. Prescriptions given and explained to patient and family. Verbalized understanding. Patient escorted off unit by nursing staff with no signs or symptoms of distress

## 2014-01-10 NOTE — Progress Notes (Signed)
Patient had a 6 in a row occurrence of vtach. MD cardiologist made aware. Will continue to monitor patient.

## 2014-01-12 ENCOUNTER — Telehealth (HOSPITAL_COMMUNITY): Payer: Self-pay | Admitting: Cardiology

## 2014-01-12 NOTE — Telephone Encounter (Signed)
appt given to caregiver

## 2014-01-12 NOTE — Telephone Encounter (Signed)
Message copied by JEFFRIES, Milagros ReapHANTEL M on Mon Jan 12, 2014  9:19 AM ------      Message from: Dolores PattyBENSIMHON, DANIEL R      Created: Sat Jan 10, 2014  9:18 AM       Can you please set [name] up for HF clinic visit in 2 weeks. Thanks.  ------

## 2014-01-13 LAB — URINE CULTURE: Colony Count: >=100000

## 2014-01-16 NOTE — ED Provider Notes (Signed)
Medical screening examination/treatment/procedure(s) were conducted as a shared visit with non-physician practitioner(s) and myself.  I personally evaluated the patient during the encounter.   EKG Interpretation   Date/Time:  Thursday January 08 2014 11:19:35 EDT Ventricular Rate:  94 PR Interval:  172 QRS Duration: 76 QT Interval:  384 QTC Calculation: 480 R Axis:   86 Text Interpretation:  Normal sinus rhythm Minimal voltage criteria for  LVH, may be normal variant T wave abnormality, consider inferolateral  ischemia Prolonged QT Abnormal ECG No comparison Confirmed by Fayrene FearingJAMES  MD,  Tiaria Biby ([id number]) on 01/08/2014 12:50:52 PM      Pt seen and examined.  D/W PA Geiple. Reports SOB worsening over 1-2 days.  Minimal basilar crackles noted.  CXR and BNP show chf.  Given Lasix, plan admit.  Rolland PorterMark Tomi Grandpre, MD 01/16/14 [phone]

## 2014-01-19 ENCOUNTER — Encounter (HOSPITAL_COMMUNITY): Payer: Medicare Other

## 2014-01-26 ENCOUNTER — Ambulatory Visit (HOSPITAL_COMMUNITY)
Admission: RE | Admit: 2014-01-26 | Discharge: 2014-01-26 | Disposition: A | Discharge status: Home / Self Care | Payer: Medicare PPO | Source: Ambulatory Visit | Attending: Internal Medicine | Admitting: Internal Medicine

## 2014-01-26 ENCOUNTER — Encounter (HOSPITAL_COMMUNITY): Payer: Self-pay

## 2014-01-26 VITALS — BP 113/68 | HR 69 | Resp 18 | Wt 117.2 lb

## 2014-01-26 DIAGNOSIS — Z7982 Long term (current) use of aspirin: Secondary | ICD-10-CM | POA: Diagnosis not present

## 2014-01-26 DIAGNOSIS — I158 Other secondary hypertension: Secondary | ICD-10-CM

## 2014-01-26 DIAGNOSIS — E785 Hyperlipidemia, unspecified: Secondary | ICD-10-CM | POA: Diagnosis not present

## 2014-01-26 DIAGNOSIS — K219 Gastro-esophageal reflux disease without esophagitis: Secondary | ICD-10-CM | POA: Insufficient documentation

## 2014-01-26 DIAGNOSIS — Z87891 Personal history of nicotine dependence: Secondary | ICD-10-CM | POA: Insufficient documentation

## 2014-01-26 DIAGNOSIS — I5022 Chronic systolic (congestive) heart failure: Secondary | ICD-10-CM | POA: Diagnosis not present

## 2014-01-26 DIAGNOSIS — I10 Essential (primary) hypertension: Secondary | ICD-10-CM | POA: Insufficient documentation

## 2014-01-26 MED ORDER — FUROSEMIDE 20 MG PO TABS: 20.0000 mg | ORAL_TABLET | ORAL | Status: DC

## 2014-01-26 NOTE — Patient Instructions (Signed)
Follow up in 2 weeks  Take lasix 20 mg Mon-Wed-Fri  Do the following things EVERYDAY: 1) Weigh yourself in the morning before breakfast. Write it down and keep it in a log. 2) Take your medicines as prescribed 3) Eat low salt foods-Limit salt (sodium) to 2000 mg per day.  4) Stay as active as you can everyday 5) Limit all fluids for the day to less than 2 liters

## 2014-01-26 NOTE — Progress Notes (Signed)
Patient ID: Traci Hale, female   DOB: [birth year], 80 y.o.   MRN: [id number]   PCP: Traci Busmanami Fulp MD  HPI: Traci Hale is an 80 year old with a history of HTN, Hyperlipidemia, chronic systolic heart failure .  Admitted 01/08/14  with increased dyspnea from heart failure. Diuresed with IV lasix. She was discharged on carvedilol and lisinopril.  Discharge weight weight was108 pounds.   She presents as a new patient today. Complains of dyspnea on exertion. Says she stays in the bed most of the time and essentially moves from bed to chair. She has been out of lasix for about 1 week. Says she hid the medication and cant find lasix. Mild dyspnea with exertion. Denies PND/Orthopnea/CP. Weight at home 115 pounds. Lives with granddaughter. Appetite fair. Takes all medicaitons. [facility] following.    01/09/14 ECHO EF 35-40%   Labs 01/10/14 K 3.9 Creatinine 0.79   History   Social History  . Marital Status: Widowed    Spouse Name: N/A    Number of Children: N/A  . Years of Education: N/A   Occupational History  . Not on file.   Social History Main Topics  . Smoking status: Former Games developermoker  . Smokeless tobacco: Never Used     Comment: quit smoking in the 80"s   . Alcohol Use: Yes     Comment: occasional  . Drug Use: No  . Sexual Activity: Not on file   Other Topics Concern  . Not on file   Social History Narrative  . No narrative on file   ROS: All systems negative except as listed in HPI, PMH and Problem List.  Past Medical History  Diagnosis Date  . Hypertension   . CHF (congestive heart failure)   . Hyperlipidemia   . Heart murmur   . Shortness of breath   . GERD (gastroesophageal reflux disease)     Current Outpatient Prescriptions  Medication Sig Dispense Refill  . aspirin EC 81 MG EC tablet Take 1 tablet (81 mg total) by mouth daily.  30 tablet  1  . Calcium Carbonate-Vitamin D (CALCIUM 600+D) 600-200 MG-UNIT TABS Take 1 tablet by mouth 2 (two) times daily.      .  carvedilol (COREG) 12.5 MG tablet Take 0.5 tablets (6.25 mg total) by mouth 2 (two) times daily with a meal.  60 tablet  1  . Cholecalciferol (VITAMIN D-3) 5000 UNITS TABS Take 1 tablet by mouth daily.      . feeding supplement, ENSURE COMPLETE, (ENSURE COMPLETE) LIQD Take 237 mLs by mouth 2 (two) times daily between meals.      . Folic Acid-Vit B6-Vit B12 (FOLGARD) 0.8-10-0.115 MG TABS Take 1 tablet by mouth daily.      Marland Kitchen. lisinopril (PRINIVIL,ZESTRIL) 5 MG tablet Take 1 tablet (5 mg total) by mouth daily.  30 tablet  1  . Omega 3-6-9 Fatty Acids (OMEGA-3 FUSION) LIQD Take 5 mLs by mouth daily. 1000mg  omega 3 liquid      . Probiotic Product (PROBIOTIC DAILY PO) Take 1 capsule by mouth daily.      . simvastatin (ZOCOR) 20 MG tablet Take 20 mg by mouth daily.      . Coenzyme Q10 (CO Q 10) 60 MG CAPS Take 60 mg by mouth daily.      . furosemide (LASIX) 20 MG tablet Take 1 tablet (20 mg total) by mouth daily.  30 tablet  1   No current facility-administered medications for this encounter.  PHYSICAL EXAM: Filed Vitals:   01/26/14 1432  BP: 113/68  Pulse: 69  Resp: 18  Weight: 117 lb 4 oz (53.184 kg)  SpO2: 100%    General:  Elderly. Thin. No resp difficulty Gradndaughter present  HEENT: normal Neck: supple. JVP 8-9. Carotids 2+ bilaterally; no bruits. No lymphadenopathy or thryomegaly appreciated. Cor: PMI normal. Regular rate & rhythm. No rubs, gallops or murmurs. Lungs: clear Abdomen: soft, nontender, nondistended. No hepatosplenomegaly. No bruits or masses. Good bowel sounds. Extremities: no cyanosis, clubbing, rash,  LLE trace-1+ RLE trace edema.   Neuro: alert & orientedx3, cranial nerves grossly intact. Moves all 4 extremities w/o difficulty. Affect pleasant.    ASSESSMENT & PLAN: 1. Chronic Systolic Heart Failure. She was diagnosed in October 2015. ECHO EF 35-40% She has not had an ischemic work up but not having ischemic symptoms. Given advanced age would not pursue.   Functionally she is essentially chair bound and has limited movement.  NYHA III. Volume status mildly elevated but not surprising as she has been out of lasix for 1 week. Restart lasix 20 mg three times a week Continue carvedilol 6.25 mg twice a day Continue lisinopril 5 mg daily Can consider digoxin however will need to make sure she is able to take medications.  Check BMET today.  She does not have advanced directive will need to discuss at next visit. May need to refer to Hospice if she declines.  Will ask [facility] to check next week and will ask that medications are verified at next home visit.  Provided with weight log and reinforced medication compliance.   2. HTN- Stable . Continue current regimen.  3. Hyperlipidemia- continue statin  Follow up in 2 weeks.   [name] NP-C  7:51 AM

## 2014-02-09 ENCOUNTER — Inpatient Hospital Stay (HOSPITAL_COMMUNITY): Admission: RE | Admit: 2014-02-09 | Payer: Medicare PPO | Source: Ambulatory Visit

## 2014-06-10 ENCOUNTER — Inpatient Hospital Stay (HOSPITAL_COMMUNITY): Payer: Commercial Managed Care - HMO

## 2014-06-10 ENCOUNTER — Encounter (HOSPITAL_COMMUNITY): Payer: Self-pay | Admitting: *Deleted

## 2014-06-10 ENCOUNTER — Emergency Department (HOSPITAL_COMMUNITY): Payer: Commercial Managed Care - HMO

## 2014-06-10 ENCOUNTER — Other Ambulatory Visit (HOSPITAL_COMMUNITY): Payer: Self-pay

## 2014-06-10 ENCOUNTER — Inpatient Hospital Stay (HOSPITAL_COMMUNITY)
Admission: EM | Admit: 2014-06-10 | Discharge: 2014-06-18 | DRG: 871 | Disposition: A | Discharge status: Skilled Nursing Facility | Payer: Commercial Managed Care - HMO | Attending: Internal Medicine | Admitting: Internal Medicine

## 2014-06-10 DIAGNOSIS — I4891 Unspecified atrial fibrillation: Secondary | ICD-10-CM | POA: Diagnosis present

## 2014-06-10 DIAGNOSIS — Z79899 Other long term (current) drug therapy: Secondary | ICD-10-CM | POA: Diagnosis not present

## 2014-06-10 DIAGNOSIS — N179 Acute kidney failure, unspecified: Secondary | ICD-10-CM | POA: Diagnosis present

## 2014-06-10 DIAGNOSIS — J1008 Influenza due to other identified influenza virus with other specified pneumonia: Secondary | ICD-10-CM | POA: Diagnosis present

## 2014-06-10 DIAGNOSIS — I5022 Chronic systolic (congestive) heart failure: Secondary | ICD-10-CM | POA: Diagnosis present

## 2014-06-10 DIAGNOSIS — A400 Sepsis due to streptococcus, group A: Principal | ICD-10-CM | POA: Diagnosis present

## 2014-06-10 DIAGNOSIS — A491 Streptococcal infection, unspecified site: Secondary | ICD-10-CM | POA: Diagnosis present

## 2014-06-10 DIAGNOSIS — J189 Pneumonia, unspecified organism: Secondary | ICD-10-CM | POA: Diagnosis present

## 2014-06-10 DIAGNOSIS — Z4659 Encounter for fitting and adjustment of other gastrointestinal appliance and device: Secondary | ICD-10-CM

## 2014-06-10 DIAGNOSIS — K219 Gastro-esophageal reflux disease without esophagitis: Secondary | ICD-10-CM | POA: Diagnosis present

## 2014-06-10 DIAGNOSIS — Z7982 Long term (current) use of aspirin: Secondary | ICD-10-CM

## 2014-06-10 DIAGNOSIS — Z87891 Personal history of nicotine dependence: Secondary | ICD-10-CM | POA: Diagnosis not present

## 2014-06-10 DIAGNOSIS — F0390 Unspecified dementia without behavioral disturbance: Secondary | ICD-10-CM | POA: Diagnosis present

## 2014-06-10 DIAGNOSIS — J9601 Acute respiratory failure with hypoxia: Secondary | ICD-10-CM | POA: Diagnosis present

## 2014-06-10 DIAGNOSIS — Z6823 Body mass index (BMI) 23.0-23.9, adult: Secondary | ICD-10-CM

## 2014-06-10 DIAGNOSIS — E876 Hypokalemia: Secondary | ICD-10-CM | POA: Diagnosis present

## 2014-06-10 DIAGNOSIS — G9341 Metabolic encephalopathy: Secondary | ICD-10-CM | POA: Diagnosis present

## 2014-06-10 DIAGNOSIS — R570 Cardiogenic shock: Secondary | ICD-10-CM | POA: Diagnosis present

## 2014-06-10 DIAGNOSIS — R32 Unspecified urinary incontinence: Secondary | ICD-10-CM | POA: Diagnosis present

## 2014-06-10 DIAGNOSIS — R059 Cough, unspecified: Secondary | ICD-10-CM

## 2014-06-10 DIAGNOSIS — J9690 Respiratory failure, unspecified, unspecified whether with hypoxia or hypercapnia: Secondary | ICD-10-CM

## 2014-06-10 DIAGNOSIS — R6521 Severe sepsis with septic shock: Secondary | ICD-10-CM | POA: Diagnosis present

## 2014-06-10 DIAGNOSIS — E43 Unspecified severe protein-calorie malnutrition: Secondary | ICD-10-CM | POA: Diagnosis present

## 2014-06-10 DIAGNOSIS — E785 Hyperlipidemia, unspecified: Secondary | ICD-10-CM | POA: Diagnosis present

## 2014-06-10 DIAGNOSIS — J154 Pneumonia due to other streptococci: Secondary | ICD-10-CM | POA: Diagnosis present

## 2014-06-10 DIAGNOSIS — Z66 Do not resuscitate: Secondary | ICD-10-CM | POA: Diagnosis present

## 2014-06-10 DIAGNOSIS — J690 Pneumonitis due to inhalation of food and vomit: Secondary | ICD-10-CM

## 2014-06-10 DIAGNOSIS — R05 Cough: Secondary | ICD-10-CM

## 2014-06-10 DIAGNOSIS — W19XXXA Unspecified fall, initial encounter: Secondary | ICD-10-CM

## 2014-06-10 DIAGNOSIS — D72819 Decreased white blood cell count, unspecified: Secondary | ICD-10-CM | POA: Diagnosis present

## 2014-06-10 DIAGNOSIS — J1100 Influenza due to unidentified influenza virus with unspecified type of pneumonia: Secondary | ICD-10-CM | POA: Diagnosis present

## 2014-06-10 DIAGNOSIS — A419 Sepsis, unspecified organism: Secondary | ICD-10-CM | POA: Diagnosis present

## 2014-06-10 DIAGNOSIS — J9621 Acute and chronic respiratory failure with hypoxia: Secondary | ICD-10-CM | POA: Diagnosis present

## 2014-06-10 DIAGNOSIS — I5042 Chronic combined systolic (congestive) and diastolic (congestive) heart failure: Secondary | ICD-10-CM | POA: Diagnosis present

## 2014-06-10 DIAGNOSIS — R531 Weakness: Secondary | ICD-10-CM | POA: Diagnosis present

## 2014-06-10 DIAGNOSIS — I10 Essential (primary) hypertension: Secondary | ICD-10-CM | POA: Diagnosis present

## 2014-06-10 LAB — CBC WITH DIFFERENTIAL/PLATELET
BASOS PCT: 1 % (ref 0–1)
Basophils Absolute: 0 10*3/uL (ref 0.0–0.1)
EOS ABS: 0 10*3/uL (ref 0.0–0.7)
EOS PCT: 0 % (ref 0–5)
HEMATOCRIT: 42.8 % (ref 36.0–46.0)
Hemoglobin: 14.2 g/dL (ref 12.0–15.0)
LYMPHS ABS: 0.3 10*3/uL — AB (ref 0.7–4.0)
LYMPHS PCT: 17 % (ref 12–46)
MCH: 34.8 pg — AB (ref 26.0–34.0)
MCHC: 33.2 g/dL (ref 30.0–36.0)
MCV: 104.9 fL — AB (ref 78.0–100.0)
MONOS PCT: 6 % (ref 3–12)
Monocytes Absolute: 0.1 10*3/uL (ref 0.1–1.0)
NEUTROS PCT: 76 % (ref 43–77)
Neutro Abs: 1.2 10*3/uL — ABNORMAL LOW (ref 1.7–7.7)
PLATELETS: 210 10*3/uL (ref 150–400)
RBC: 4.08 MIL/uL (ref 3.87–5.11)
RDW: 13.9 % (ref 11.5–15.5)
WBC Morphology: INCREASED
WBC: 1.6 10*3/uL — ABNORMAL LOW (ref 4.0–10.5)

## 2014-06-10 LAB — URINALYSIS, ROUTINE W REFLEX MICROSCOPIC
Bilirubin Urine: NEGATIVE
GLUCOSE, UA: NEGATIVE mg/dL
KETONES UR: 15 mg/dL — AB
LEUKOCYTES UA: NEGATIVE
NITRITE: NEGATIVE
PH: 5.5 (ref 5.0–8.0)
SPECIFIC GRAVITY, URINE: 1.019 (ref 1.005–1.030)
Urobilinogen, UA: 0.2 mg/dL (ref 0.0–1.0)

## 2014-06-10 LAB — BLOOD GAS, ARTERIAL
ACID-BASE DEFICIT: 5.5 mmol/L — AB (ref 0.0–2.0)
Acid-base deficit: 7.5 mmol/L — ABNORMAL HIGH (ref 0.0–2.0)
BICARBONATE: 18.3 meq/L — AB (ref 20.0–24.0)
Bicarbonate: 16.9 mEq/L — ABNORMAL LOW (ref 20.0–24.0)
DELIVERY SYSTEMS: POSITIVE
DRAWN BY: 331471
DRAWN BY: 308601
EXPIRATORY PAP: 10
FIO2: 1 %
FIO2: 1 %
INSPIRATORY PAP: 17
MECHVT: 400 mL
O2 SAT: 97 %
O2 Saturation: 98.8 %
PATIENT TEMPERATURE: 98.6
PATIENT TEMPERATURE: 98.6
PCO2 ART: 32.3 mmHg — AB (ref 35.0–45.0)
PEEP: 5 cmH2O
PO2 ART: 105 mmHg — AB (ref 80.0–100.0)
RATE: 16 resp/min
TCO2: 16.4 mmol/L (ref 0–100)
TCO2: 15.5 mmol/L (ref 0–100)
pCO2 arterial: 32 mmHg — ABNORMAL LOW (ref 35.0–45.0)
pH, Arterial: 7.373 (ref 7.350–7.450)
pH, Arterial: 7.342 — ABNORMAL LOW (ref 7.350–7.450)
pO2, Arterial: 195 mmHg — ABNORMAL HIGH (ref 80.0–100.0)

## 2014-06-10 LAB — COMPREHENSIVE METABOLIC PANEL
ALT: 16 U/L (ref 0–35)
AST: 57 U/L — AB (ref 0–37)
Albumin: 3.6 g/dL (ref 3.5–5.2)
Alkaline Phosphatase: 45 U/L (ref 39–117)
Anion gap: 12 (ref 5–15)
BILIRUBIN TOTAL: 1.3 mg/dL — AB (ref 0.3–1.2)
BUN: 26 mg/dL — AB (ref 6–23)
CHLORIDE: 110 mmol/L (ref 96–112)
CO2: 19 mmol/L (ref 19–32)
CREATININE: 1.17 mg/dL — AB (ref 0.50–1.10)
Calcium: 8.7 mg/dL (ref 8.4–10.5)
GFR calc Af Amer: 47 mL/min — ABNORMAL LOW (ref >90)
GFR calc non Af Amer: 41 mL/min — ABNORMAL LOW (ref >90)
GLUCOSE: 83 mg/dL (ref 70–99)
POTASSIUM: 3 mmol/L — AB (ref 3.5–5.1)
SODIUM: 141 mmol/L (ref 135–145)
Total Protein: 8.4 g/dL — ABNORMAL HIGH (ref 6.0–8.3)

## 2014-06-10 LAB — GLUCOSE, CAPILLARY: Glucose-Capillary: 92 mg/dL (ref 70–99)

## 2014-06-10 LAB — URINE MICROSCOPIC-ADD ON

## 2014-06-10 LAB — I-STAT CG4 LACTIC ACID, ED: Lactic Acid, Venous: 2.47 mmol/L (ref 0.5–2.0)

## 2014-06-10 MED ORDER — PIPERACILLIN-TAZOBACTAM 3.375 G IVPB 30 MIN: 3.3750 g | Freq: Three times a day (TID) | INTRAVENOUS | Status: DC

## 2014-06-10 MED ORDER — LIDOCAINE HCL (CARDIAC) 20 MG/ML IV SOLN
INTRAVENOUS | Status: AC
  Filled 2014-06-10: qty 5

## 2014-06-10 MED ORDER — SODIUM CHLORIDE 0.9 % IV SOLN
INTRAVENOUS | Status: DC
  Administered 2014-06-10: 21:00:00 via INTRAVENOUS

## 2014-06-10 MED ORDER — VANCOMYCIN HCL IN DEXTROSE 1-5 GM/200ML-% IV SOLN
1000.0000 mg | Freq: Once | INTRAVENOUS | Status: AC
  Administered 2014-06-10: 1000 mg via INTRAVENOUS
  Filled 2014-06-10: qty 200

## 2014-06-10 MED ORDER — ENOXAPARIN SODIUM 40 MG/0.4ML ~~LOC~~ SOLN: 40.0000 mg | SUBCUTANEOUS | Status: DC

## 2014-06-10 MED ORDER — ETOMIDATE 2 MG/ML IV SOLN
INTRAVENOUS | Status: AC
Start: 2014-06-10 — End: 2014-06-10
  Administered 2014-06-10: 10 mg
  Filled 2014-06-10: qty 20

## 2014-06-10 MED ORDER — DEXTROSE 5 % IV SOLN
2.0000 ug/min | INTRAVENOUS | Status: DC
  Administered 2014-06-11: 2 ug/min via INTRAVENOUS
  Filled 2014-06-10: qty 4

## 2014-06-10 MED ORDER — PIPERACILLIN-TAZOBACTAM 3.375 G IVPB: 3.3750 g | Freq: Three times a day (TID) | INTRAVENOUS | Status: DC

## 2014-06-10 MED ORDER — OSELTAMIVIR PHOSPHATE 6 MG/ML PO SUSR
30.0000 mg | Freq: Every day | ORAL | Status: DC
  Administered 2014-06-11 (×2): 30 mg
  Filled 2014-06-10 (×3): qty 5

## 2014-06-10 MED ORDER — CETYLPYRIDINIUM CHLORIDE 0.05 % MT LIQD
7.0000 mL | Freq: Four times a day (QID) | OROMUCOSAL | Status: DC
  Administered 2014-06-11 – 2014-06-12 (×7): 7 mL via OROMUCOSAL

## 2014-06-10 MED ORDER — FENTANYL CITRATE 0.05 MG/ML IJ SOLN
50.0000 ug | INTRAMUSCULAR | Status: DC | PRN
  Administered 2014-06-10 – 2014-06-12 (×11): 50 ug via INTRAVENOUS
  Filled 2014-06-10 (×9): qty 2

## 2014-06-10 MED ORDER — SODIUM CHLORIDE 0.9 % IV SOLN: INTRAVENOUS | Status: DC

## 2014-06-10 MED ORDER — PIPERACILLIN-TAZOBACTAM 3.375 G IVPB 30 MIN
3.3750 g | Freq: Once | INTRAVENOUS | Status: AC
  Administered 2014-06-10: 3.375 g via INTRAVENOUS
  Filled 2014-06-10: qty 50

## 2014-06-10 MED ORDER — HEPARIN SODIUM (PORCINE) 5000 UNIT/ML IJ SOLN
5000.0000 [IU] | Freq: Three times a day (TID) | INTRAMUSCULAR | Status: DC
  Administered 2014-06-10 – 2014-06-12 (×5): 5000 [IU] via SUBCUTANEOUS
  Filled 2014-06-10 (×6): qty 1

## 2014-06-10 MED ORDER — SUCCINYLCHOLINE CHLORIDE 20 MG/ML IJ SOLN
INTRAMUSCULAR | Status: AC
  Filled 2014-06-10: qty 1

## 2014-06-10 MED ORDER — FENTANYL CITRATE 0.05 MG/ML IJ SOLN
INTRAMUSCULAR | Status: AC
  Administered 2014-06-10: 100 ug
  Filled 2014-06-10: qty 2

## 2014-06-10 MED ORDER — POTASSIUM CHLORIDE 10 MEQ/100ML IV SOLN
10.0000 meq | INTRAVENOUS | Status: AC
  Administered 2014-06-10: 10 meq via INTRAVENOUS
  Filled 2014-06-10: qty 100

## 2014-06-10 MED ORDER — SODIUM CHLORIDE 0.9 % IV BOLUS (SEPSIS)
1000.0000 mL | INTRAVENOUS | Status: AC
  Administered 2014-06-10 (×2): 1000 mL via INTRAVENOUS

## 2014-06-10 MED ORDER — SODIUM CHLORIDE 0.9 % IV SOLN: 250.0000 mL | INTRAVENOUS | Status: DC | PRN

## 2014-06-10 MED ORDER — PANTOPRAZOLE SODIUM 40 MG IV SOLR
40.0000 mg | Freq: Every day | INTRAVENOUS | Status: DC
  Administered 2014-06-10 – 2014-06-11 (×2): 40 mg via INTRAVENOUS
  Filled 2014-06-10 (×2): qty 40

## 2014-06-10 MED ORDER — POTASSIUM CHLORIDE 10 MEQ/100ML IV SOLN
10.0000 meq | INTRAVENOUS | Status: AC
  Administered 2014-06-11 (×2): 10 meq via INTRAVENOUS
  Filled 2014-06-10 (×2): qty 100

## 2014-06-10 MED ORDER — FENTANYL CITRATE 0.05 MG/ML IJ SOLN
50.0000 ug | INTRAMUSCULAR | Status: DC | PRN
  Filled 2014-06-10: qty 2

## 2014-06-10 MED ORDER — CHLORHEXIDINE GLUCONATE 0.12 % MT SOLN
15.0000 mL | Freq: Two times a day (BID) | OROMUCOSAL | Status: DC
  Administered 2014-06-11 – 2014-06-12 (×4): 15 mL via OROMUCOSAL
  Filled 2014-06-10 (×4): qty 15

## 2014-06-10 MED ORDER — ASPIRIN 325 MG PO TABS
325.0000 mg | ORAL_TABLET | Freq: Every day | ORAL | Status: DC
Start: 2014-06-10 — End: 2014-06-18
  Administered 2014-06-11 – 2014-06-18 (×9): 325 mg
  Filled 2014-06-10 (×9): qty 1

## 2014-06-10 MED ORDER — ACETAMINOPHEN 650 MG RE SUPP
650.0000 mg | Freq: Once | RECTAL | Status: AC
  Administered 2014-06-10: 650 mg via RECTAL
  Filled 2014-06-10: qty 1

## 2014-06-10 MED ORDER — LEVOFLOXACIN IN D5W 750 MG/150ML IV SOLN
750.0000 mg | INTRAVENOUS | Status: DC
  Administered 2014-06-10: 750 mg via INTRAVENOUS
  Filled 2014-06-10: qty 150

## 2014-06-10 MED ORDER — VANCOMYCIN HCL 500 MG IV SOLR
500.0000 mg | INTRAVENOUS | Status: DC
  Administered 2014-06-11: 500 mg via INTRAVENOUS
  Filled 2014-06-10 (×3): qty 500

## 2014-06-10 MED ORDER — OSELTAMIVIR PHOSPHATE 75 MG PO CAPS
75.0000 mg | ORAL_CAPSULE | Freq: Two times a day (BID) | ORAL | Status: DC
  Filled 2014-06-10: qty 1

## 2014-06-10 MED ORDER — POTASSIUM CHLORIDE 10 MEQ/100ML IV SOLN
10.0000 meq | INTRAVENOUS | Status: AC
  Administered 2014-06-10 (×2): 10 meq via INTRAVENOUS
  Filled 2014-06-10: qty 100

## 2014-06-10 MED ORDER — MIDAZOLAM HCL 2 MG/2ML IJ SOLN
INTRAMUSCULAR | Status: AC
  Administered 2014-06-10: 2 mg
  Filled 2014-06-10: qty 2

## 2014-06-10 MED ORDER — ROCURONIUM BROMIDE 50 MG/5ML IV SOLN
INTRAVENOUS | Status: AC
  Filled 2014-06-10: qty 2

## 2014-06-10 MED ORDER — DEXTROSE 5 % IV SOLN
1.0000 g | INTRAVENOUS | Status: DC
  Administered 2014-06-10: 1 g via INTRAVENOUS
  Filled 2014-06-10: qty 10

## 2014-06-10 MED ORDER — ASPIRIN EC 81 MG PO TBEC: 81.0000 mg | DELAYED_RELEASE_TABLET | Freq: Every day | ORAL | Status: DC

## 2014-06-10 NOTE — H&P (Signed)
PCP:   Cain Saupe, MD    Chief Complaint:  Weak, confused  HPI: Traci Hale is a 81 y.o. female   has a past medical history of Hypertension; CHF (congestive heart failure); Hyperlipidemia; Heart murmur; Shortness of breath; and GERD (gastroesophageal reflux disease).   Presented with  2 day hx of worsening confusion and cough. Patient has been falling more than usual lately. Per family she has mild dementia at baseline. EMS was called and she was found to be febrile taken to ER. CXR showed Right side diffuse PNA. Patient was sating  70% required NR and now on biPAP. Patient is currently alert. PCCM has been called but requesting medicine to admit at this point.  Of note patient has known systolic heart failure. Echogram done in October 2015 showing EF 35% she is usually on Lasix. In emergency department patient met criteria for sepsis with fever up to 103.5, respirations up to 40hypoxic down to 80% Patient was never hypotensive she was given 2 L bolus as part of sepsis protocol Hospitalist was called for admission for Sepsis and CAP  Review of Systems:    Pertinent positives include: Fevers, chills, fatigue, shortness of breath at rest. 1 episode of diarrhea  Constitutional:  No weight loss, night sweats,  weight loss  HEENT:  No headaches, Difficulty swallowing,Tooth/dental problems,Sore throat,  No sneezing, itching, ear ache, nasal congestion, post nasal drip,  Cardio-vascular:  No chest pain, Orthopnea, PND, anasarca, dizziness, palpitations.no Bilateral lower extremity swelling  GI:  No heartburn, indigestion, abdominal pain, nausea, vomiting, diarrhea, change in bowel habits, loss of appetite, melena, blood in stool, hematemesis Resp:  no No dyspnea on exertion, No excess mucus, no productive cough, No non-productive cough, No coughing up of blood.No change in color of mucus.No wheezing. Skin:  no rash or lesions. No jaundice GU:  no dysuria, change in color of  urine, no urgency or frequency. No straining to urinate.  No flank pain.  Musculoskeletal:  No joint pain or no joint swelling. No decreased range of motion. No back pain.  Psych:  No change in mood or affect. No depression or anxiety. No memory loss.  Neuro: no localizing neurological complaints, no tingling, no weakness, no double vision, no gait abnormality, no slurred speech, no confusion  Otherwise ROS are negative except for above, 10 systems were reviewed  Past Medical History: Past Medical History  Diagnosis Date  . Hypertension   . CHF (congestive heart failure)   . Hyperlipidemia   . Heart murmur   . Shortness of breath   . GERD (gastroesophageal reflux disease)    Past Surgical History  Procedure Laterality Date  . Leg surgery       Medications: Prior to Admission medications   Medication Sig Start Date End Date Taking? Authorizing Provider  aspirin EC 81 MG EC tablet Take 1 tablet (81 mg total) by mouth daily. 01/10/14  Yes Vassie Loll, MD  Calcium Carbonate-Vitamin D (CALCIUM 600+D) 600-200 MG-UNIT TABS Take 1 tablet by mouth 2 (two) times daily.   Yes Historical Provider, MD  carvedilol (COREG) 12.5 MG tablet Take 0.5 tablets (6.25 mg total) by mouth 2 (two) times daily with a meal. Patient taking differently: Take 12.5 mg by mouth 2 (two) times daily.  01/10/14  Yes Vassie Loll, MD  Cholecalciferol (VITAMIN D-3) 5000 UNITS TABS Take 1 tablet by mouth daily.   Yes Historical Provider, MD  feeding supplement, ENSURE COMPLETE, (ENSURE COMPLETE) LIQD Take 237 mLs by  mouth 2 (two) times daily between meals. 01/10/14  Yes Barton Dubois, MD  Folic Acid-Vit M7-WKG S81 (FOLGARD) 0.8-10-0.115 MG TABS Take 1 tablet by mouth daily.   Yes Historical Provider, MD  furosemide (LASIX) 20 MG tablet Take 1 tablet (20 mg total) by mouth 3 (three) times a week. Patient taking differently: Take 20 mg by mouth daily.  01/26/14  Yes Amy D Clegg, NP  lisinopril (PRINIVIL,ZESTRIL) 5  MG tablet Take 1 tablet (5 mg total) by mouth daily. 01/10/14  Yes Barton Dubois, MD  NIFEdipine (ADALAT CC) 90 MG 24 hr tablet Take 1 tablet by mouth daily. 06/05/14  Yes Historical Provider, MD  Omega 3-6-9 Fatty Acids (OMEGA-3 FUSION) LIQD Take 5 mLs by mouth daily. $RemoveBefo'1000mg'iIFhNdvTugH$  omega 3 liquid   Yes Historical Provider, MD  Probiotic Product (PROBIOTIC DAILY PO) Take 1 capsule by mouth daily.   Yes Historical Provider, MD  simvastatin (ZOCOR) 20 MG tablet Take 20 mg by mouth daily.   Yes Historical Provider, MD    Allergies:  No Known Allergies  Social History:  Ambulatory   Cane or walker,   Lives at home  With family     reports that she has quit smoking. She has never used smokeless tobacco. She reports that she drinks alcohol. She reports that she does not use illicit drugs.    Family History: family history includes Breast cancer in her daughter; Hypertension in her father.    Physical Exam: Patient Vitals for the past 24 hrs:  BP Temp Temp src Pulse Resp SpO2 Height Weight  06/10/14 1839 127/77 mmHg - - 99 (!) 34 97 % - -  06/10/14 1752 118/63 mmHg - - 100 (!) 33 100 % - -  06/10/14 1723 127/81 mmHg - - 110 20 95 % - -  06/10/14 1614 - - - - - - $R'5\' 2"'fK$  (1.575 m) 54.432 kg (120 lb)  06/10/14 1506 - - - - - 93 % - -  06/10/14 1501 143/60 mmHg (!) 103.5 F (39.7 C) Rectal 109 22 93 % - -  06/10/14 1447 - - - - - (!) 88 % - -    1. General:  in No Acute distress 2. Psychological: Alert not Oriented 3. Head/ENT:    Dry Mucous Membranes                          Head Non traumatic, neck supple                          Poor Dentition 4. SKIN: decreased Skin turgor,  Skin clean Dry and intact no rash 5. Heart: Regular rate and rhythm no Murmur, Rub or gallop 6. Lungs:no wheezes extensive crackles   7. Abdomen: Soft, non-tender, Non distended 8. Lower extremities: no clubbing, cyanosis, or edema 9. Neurologically Grossly intact, moving all 4 extremities equally 10. MSK: Normal  range of motion  body mass index is 21.94 kg/(m^2).   Labs on Admission:   Results for orders placed or performed during the hospital encounter of 06/10/14 (from the past 24 hour(s))  I-Stat CG4 Lactic Acid, ED     Status: Abnormal   Collection Time: 06/10/14  3:39 PM  Result Value Ref Range   Lactic Acid, Venous 2.47 (HH) 0.5 - 2.0 mmol/L   Comment NOTIFIED PHYSICIAN   CBC WITH DIFFERENTIAL     Status: Abnormal (Preliminary result)   Collection Time: 06/10/14  3:40 PM  Result Value Ref Range   WBC 1.6 (L) 4.0 - 10.5 K/uL   RBC 4.08 3.87 - 5.11 MIL/uL   Hemoglobin 14.2 12.0 - 15.0 g/dL   HCT 42.8 36.0 - 46.0 %   MCV 104.9 (H) 78.0 - 100.0 fL   MCH 34.8 (H) 26.0 - 34.0 pg   MCHC 33.2 30.0 - 36.0 g/dL   RDW 13.9 11.5 - 15.5 %   Platelets 210 150 - 400 K/uL   Neutrophils Relative % PENDING 43 - 77 %   Neutro Abs PENDING 1.7 - 7.7 K/uL   Band Neutrophils PENDING 0 - 10 %   Lymphocytes Relative PENDING 12 - 46 %   Lymphs Abs PENDING 0.7 - 4.0 K/uL   Monocytes Relative PENDING 3 - 12 %   Monocytes Absolute PENDING 0.1 - 1.0 K/uL   Eosinophils Relative PENDING 0 - 5 %   Eosinophils Absolute PENDING 0.0 - 0.7 K/uL   Basophils Relative PENDING 0 - 1 %   Basophils Absolute PENDING 0.0 - 0.1 K/uL   WBC Morphology PENDING    RBC Morphology PENDING    Smear Review PENDING    nRBC PENDING 0 /100 WBC   Metamyelocytes Relative PENDING %   Myelocytes PENDING %   Promyelocytes Absolute PENDING %   Blasts PENDING %  Comprehensive metabolic panel     Status: Abnormal   Collection Time: 06/10/14  3:40 PM  Result Value Ref Range   Sodium 141 135 - 145 mmol/L   Potassium 3.0 (L) 3.5 - 5.1 mmol/L   Chloride 110 96 - 112 mmol/L   CO2 19 19 - 32 mmol/L   Glucose, Bld 83 70 - 99 mg/dL   BUN 26 (H) 6 - 23 mg/dL   Creatinine, Ser 1.17 (H) 0.50 - 1.10 mg/dL   Calcium 8.7 8.4 - 10.5 mg/dL   Total Protein 8.4 (H) 6.0 - 8.3 g/dL   Albumin 3.6 3.5 - 5.2 g/dL   AST 57 (H) 0 - 37 U/L   ALT 16 0  - 35 U/L   Alkaline Phosphatase 45 39 - 117 U/L   Total Bilirubin 1.3 (H) 0.3 - 1.2 mg/dL   GFR calc non Af Amer 41 (L) >90 mL/min   GFR calc Af Amer 47 (L) >90 mL/min   Anion gap 12 5 - 15  Urinalysis with microscopic     Status: Abnormal   Collection Time: 06/10/14  4:43 PM  Result Value Ref Range   Color, Urine YELLOW YELLOW   APPearance TURBID (A) CLEAR   Specific Gravity, Urine 1.019 1.005 - 1.030   pH 5.5 5.0 - 8.0   Glucose, UA NEGATIVE NEGATIVE mg/dL   Hgb urine dipstick LARGE (A) NEGATIVE   Bilirubin Urine NEGATIVE NEGATIVE   Ketones, ur 15 (A) NEGATIVE mg/dL   Protein, ur >300 (A) NEGATIVE mg/dL   Urobilinogen, UA 0.2 0.0 - 1.0 mg/dL   Nitrite NEGATIVE NEGATIVE   Leukocytes, UA NEGATIVE NEGATIVE  Urine microscopic-add on     Status: Abnormal   Collection Time: 06/10/14  4:43 PM  Result Value Ref Range   Squamous Epithelial / LPF RARE RARE   WBC, UA 0-2 <3 WBC/hpf   RBC / HPF 0-2 <3 RBC/hpf   Bacteria, UA MANY (A) RARE   Urine-Other AMORPHOUS URATES/PHOSPHATES   Blood gas, arterial     Status: Abnormal   Collection Time: 06/10/14  6:15 PM  Result Value Ref Range   FIO2 1.00 %  Delivery systems BILEVEL POSITIVE AIRWAY PRESSURE    Inspiratory PAP 17    Expiratory PAP 10    pH, Arterial 7.373 7.350 - 7.450   pCO2 arterial 32.3 (L) 35.0 - 45.0 mmHg   pO2, Arterial 195.0 (H) 80.0 - 100.0 mmHg   Bicarbonate 18.3 (L) 20.0 - 24.0 mEq/L   TCO2 16.4 0 - 100 mmol/L   Acid-base deficit 5.5 (H) 0.0 - 2.0 mmol/L   O2 Saturation 98.8 %   Patient temperature 98.6    Collection site RIGHT RADIAL    Drawn by [id number]    Sample type ARTERIAL DRAW    Allens test (pass/fail) PASS PASS    UA proteinuria  No results found for: HGBA1C  Estimated Creatinine Clearance: 26.8 mL/min (by C-G formula based on Cr of 1.17).  BNP (last 3 results)  Recent Labs  01/08/14 1401 01/09/14 0040  PROBNP 18570.0* 17819.0*    Other results:  I have pearsonaly reviewed this: ECG  REPORT  Rate: 111  Rhythm: STachy ST&T Change: no ischimia   Filed Weights   06/10/14 1614  Weight: 54.432 kg (120 lb)     Cultures:    Component Value Date/Time   SDES URINE, CLEAN CATCH 01/10/2014 1022   SPECREQUEST NONE 01/10/2014 1022   CULT  01/10/2014 1022    KLEBSIELLA PNEUMONIAE ESCHERICHIA COLI Performed at [place] 01/13/2014 FINAL 01/10/2014 1022     Radiological Exams on Admission: Dg Chest 1 View  06/10/2014   CLINICAL DATA:  Cough.  EXAM: CHEST  1 VIEW  COMPARISON:  01/08/2014  FINDINGS: Diffuse airspace disease throughout the right lung concerning for pneumonia. Possible patchy opacities in the left upper lobe. Heart is borderline in size. No visible effusions. No acute bony abnormality. Aortic calcifications without visible aneurysm.  IMPRESSION: Diffuse right lung airspace disease and possible patchy left upper lobe opacities. Findings most compatible with pneumonia.   Electronically Signed   By: Rolm Baptise M.D.   On: 06/10/2014 17:06   Ct Head Wo Contrast  06/10/2014   CLINICAL DATA:  Mental status changes. Disorientation. Incontinence.  EXAM: CT HEAD WITHOUT CONTRAST  CT CERVICAL SPINE WITHOUT CONTRAST  TECHNIQUE: Multidetector CT imaging of the head and cervical spine was performed following the standard protocol without intravenous contrast. Multiplanar CT image reconstructions of the cervical spine were also generated.  COMPARISON:  None.  FINDINGS: CT HEAD FINDINGS  The brain shows generalized age related atrophy. There is chronic appearing low density in the deep white matter consistent with chronic small vessel disease. No cortical or large vessel territory infarction. No sign of acute infarction, mass lesion, hemorrhage, hydrocephalus or extra-axial collection. Calcification along the falx is incidentally noted, not significant. Sinuses, middle ears and mastoids are clear.  CT CERVICAL SPINE FINDINGS  Alignment is normal. No fracture.  No evidence of degenerative disc disease. Mild mid cervical facet degeneration, most notable on the left at C3-4 and C4-5.  IMPRESSION: Head CT: Age related atrophy and chronic small vessel change of the deep white matter. No acute or reversible finding.  Cervical spine CT: No acute or traumatic finding. Minimal degenerative change.   Electronically Signed   By: Nelson Chimes M.D.   On: 06/10/2014 16:36   Ct Cervical Spine Wo Contrast  06/10/2014   CLINICAL DATA:  Mental status changes. Disorientation. Incontinence.  EXAM: CT HEAD WITHOUT CONTRAST  CT CERVICAL SPINE WITHOUT CONTRAST  TECHNIQUE: Multidetector CT imaging of the head and cervical spine was  performed following the standard protocol without intravenous contrast. Multiplanar CT image reconstructions of the cervical spine were also generated.  COMPARISON:  None.  FINDINGS: CT HEAD FINDINGS  The brain shows generalized age related atrophy. There is chronic appearing low density in the deep white matter consistent with chronic small vessel disease. No cortical or large vessel territory infarction. No sign of acute infarction, mass lesion, hemorrhage, hydrocephalus or extra-axial collection. Calcification along the falx is incidentally noted, not significant. Sinuses, middle ears and mastoids are clear.  CT CERVICAL SPINE FINDINGS  Alignment is normal. No fracture. No evidence of degenerative disc disease. Mild mid cervical facet degeneration, most notable on the left at C3-4 and C4-5.  IMPRESSION: Head CT: Age related atrophy and chronic small vessel change of the deep white matter. No acute or reversible finding.  Cervical spine CT: No acute or traumatic finding. Minimal degenerative change.   Electronically Signed   By: Nelson Chimes M.D.   On: 06/10/2014 16:36   Dg Knee Complete 4 Views Right  06/10/2014   CLINICAL DATA:  Right hip pain and right knee pain after a fall.  EXAM: RIGHT KNEE - COMPLETE 4+ VIEW  COMPARISON:  None.  FINDINGS: Tricompartment  degenerative changes in the right knee with tricompartment narrowing osteophytosis. No significant effusion. Suggestion of ligamentous laxity in the patellar ligament. Ligamentous disruption is not excluded. No evidence of acute fracture or dislocation. No focal bone lesion or bone destruction.  IMPRESSION: Prominent degenerative changes in the right knee. No significant effusion. No acute bony abnormalities. Suggestion of ligamentous laxity of the patellar ligament.   Electronically Signed   By: Lucienne Capers M.D.   On: 06/10/2014 17:11   Dg Hip Unilat With Pelvis 2-3 Views Right  06/10/2014   CLINICAL DATA:  Disoriented.  Fall.  Right hip pain.  EXAM: RIGHT HIP (WITH PELVIS) 2-3 VIEWS  COMPARISON:  None.  FINDINGS: Hardware noted within the left femur from prior injury and internal fixation. No acute fracture, subluxation or dislocation. Mild osteopenia. Hip joints and SI joints are symmetric.  IMPRESSION: No acute bony abnormality.   Electronically Signed   By: Rolm Baptise M.D.   On: 06/10/2014 17:11    Chart has been reviewed  Assessment/Plan  81 year old female with history of systolic heart failure presents with sepsis likely secondary to pneumonia noted to be leukopenic  Present on Admission:  . Sepsis - admitted for sepsis protocol. Administer IV fluids hold blood pressure meds. Administer broad-spectrum antibiotics. Obtain pro-calcitonin. [facility] M aware we'll help manage. Will admit to ICU  Hypoxic respiratory failure  - continue BiPAP patient with increase work of breathing with severe pneumonia. Guarded prognosis. This has been discussed with family at this point daughter wishes to continue aggressive care up to including intubating if needed. Patient clinically improved on BiPAP will continue to monitor  . Community acquired pneumonia IV antibiotics admission to ICU oxygen as needed obtain sputum cultures and blood cultures  . HTN (hypertension) hold blood pressure medications given  sepsis  . Hypokalemia will replace  . Chronic systolic heart failure patient appears to be extremely dry at this point. Give  IV fluids  . Leukopenia - this has been worsening problem. Noted elevated total protein as well as protein UA. Picture somewhat worrisome for hematologic cause. We will obtain SPEP and UPEP   Prophylaxis: Lovenox, Protonix  CODE STATUS:  FULL CODE as per family  Other plan as per orders.  I have spent a total of  65 min on this admission except and was taken to discuss care of her family and CCM  Lilleigh Hechavarria 06/10/2014, 6:49 PM  [facility]  Pager [phone]   after 2 AM please page floor coverage PA If 7AM-7PM, please contact the day team taking care of the patient  [email or web address]  Password [email or web address]

## 2014-06-10 NOTE — Progress Notes (Addendum)
Pharmacy - Levaquin, Rocephin dosing  Assessment: (see previous pharmacy note) 89yoF presenting with AMS and fever. Pharmacy began vancomycin and Zosyn for sepsis.  Now consulted to add levofloxacin, ceftriaxone to replace Zosyn for CAP.  Vanc to remain for now per PCCM  CrCl 27 ml/min CG  Plan:  Levofloxacin 750 mg IV q48h  Ceftriaxone 1g IV q24h  F/u SCr, clinical course  Bernadene Personrew Yer Castello, PharmD Pager: [phone] 06/10/2014, 9:03 PM

## 2014-06-10 NOTE — Consult Note (Signed)
PULMONARY / CRITICAL CARE MEDICINE   Name: Traci Hale MRN: [id number] DOB: [birth year]    ADMISSION DATE:  06/10/2014 CONSULTATION DATE:  06/10/2014  REFERRING MD :  Adela Glimpse  CHIEF COMPLAINT:  Confusion, cough  INITIAL PRESENTATION:  81 y.o. F from home brought to [facility] ED 3/9 for worsening confusion, cough.  In ED, found to have right sided PNA, febrile to 103.  Initially placed on BiPAP; however, remained hypoxic despite PEEP 10.  Decision made to intubate for short term support.  Code status DNR.   STUDIES:  CXR 3/9 >>> diffuse R sided lung disease, ? Patchy LUL opacity. CT head 3/9 >>> no acute findings. R Knee 3/9 >>> no acute findings. R hip 3/9 >>> no acute findings.  SIGNIFICANT EVENTS: 3/9 - admit, intubated   HISTORY OF PRESENT ILLNESS:  Pt is encephalopathic; therefore, this HPI is obtained from chart review. Traci Hale is a 81 y.o. F who resides at home.  She was brought to [facility] ED 3/9 for worsening confusion and cough (although she does have mild dementia at baseline).  She had also been falling more than usual as of late. She had non-productive cough along with some dyspnea.  Denied any fevers/chills/sweats, chest pain, N/V/D, abd pain, myalgias.  On EMS arrival to home, pt sitting in urine and feces. In ED, she was febrile to 103.5.  She was also hypoxic with sats 70% so was placed on NRB and later upgraded to BiPAP.  After quite some time, she remained hypoxic despite PEEP of 10.  After extensive discussion with family, decision made to intubate for short term support; however, otherwise to be listed as DNR.   PAST MEDICAL HISTORY :   has a past medical history of Hypertension; CHF (congestive heart failure); Hyperlipidemia; Heart murmur; Shortness of breath; and GERD (gastroesophageal reflux disease).  has past surgical history that includes Leg Surgery. Prior to Admission medications   Medication Sig Start Date End Date Taking? Authorizing Provider  aspirin EC  81 MG EC tablet Take 1 tablet (81 mg total) by mouth daily. 01/10/14  Yes Vassie Loll, MD  Calcium Carbonate-Vitamin D (CALCIUM 600+D) 600-200 MG-UNIT TABS Take 1 tablet by mouth 2 (two) times daily.   Yes Historical Provider, MD  carvedilol (COREG) 12.5 MG tablet Take 0.5 tablets (6.25 mg total) by mouth 2 (two) times daily with a meal. Patient taking differently: Take 12.5 mg by mouth 2 (two) times daily.  01/10/14  Yes Vassie Loll, MD  Cholecalciferol (VITAMIN D-3) 5000 UNITS TABS Take 1 tablet by mouth daily.   Yes Historical Provider, MD  feeding supplement, ENSURE COMPLETE, (ENSURE COMPLETE) LIQD Take 237 mLs by mouth 2 (two) times daily between meals. 01/10/14  Yes Vassie Loll, MD  Folic Acid-Vit B6-Vit B12 (FOLGARD) 0.8-10-0.115 MG TABS Take 1 tablet by mouth daily.   Yes Historical Provider, MD  furosemide (LASIX) 20 MG tablet Take 1 tablet (20 mg total) by mouth 3 (three) times a week. Patient taking differently: Take 20 mg by mouth daily.  01/26/14  Yes Amy D Clegg, NP  lisinopril (PRINIVIL,ZESTRIL) 5 MG tablet Take 1 tablet (5 mg total) by mouth daily. 01/10/14  Yes Vassie Loll, MD  NIFEdipine (ADALAT CC) 90 MG 24 hr tablet Take 1 tablet by mouth daily. 06/05/14  Yes Historical Provider, MD  Omega 3-6-9 Fatty Acids (OMEGA-3 FUSION) LIQD Take 5 mLs by mouth daily. 1000mg  omega 3 liquid   Yes Historical Provider, MD  Probiotic Product (PROBIOTIC DAILY  PO) Take 1 capsule by mouth daily.   Yes Historical Provider, MD  simvastatin (ZOCOR) 20 MG tablet Take 20 mg by mouth daily.   Yes Historical Provider, MD   No Known Allergies  FAMILY HISTORY:  Family History  Problem Relation Age of Onset  . Hypertension Father   . Breast cancer Daughter     SOCIAL HISTORY:  reports that she has quit smoking. She has never used smokeless tobacco. She reports that she drinks alcohol. She reports that she does not use illicit drugs.  REVIEW OF SYSTEMS:  Unable to obtain as pt is  encephalopathic.  SUBJECTIVE:   VITAL SIGNS: Temp:  [103.5 F (39.7 C)] 103.5 F (39.7 C) (03/09 1501) Pulse Rate:  [98-110] 100 (03/09 1935) Resp:  [20-40] 40 (03/09 1935) BP: (118-143)/(60-90) 131/90 mmHg (03/09 1915) SpO2:  [88 %-100 %] 97 % (03/09 1935) FiO2 (%):  [70 %-100 %] 70 % (03/09 1935) Weight:  [54.432 kg (120 lb)] 54.432 kg (120 lb) (03/09 1614) HEMODYNAMICS:   VENTILATOR SETTINGS: Vent Mode:  [-] BIPAP FiO2 (%):  [70 %-100 %] 70 % Set Rate:  [15 bmp] 15 bmp PEEP:  [10 cmH20] 10 cmH20 INTAKE / OUTPUT: Intake/Output      03/09 0701 - 03/10 0700   I.V. (mL/kg) 1200 (22)   Total Intake(mL/kg) 1200 (22)   Net +1200         PHYSICAL EXAMINATION: General: Frail elderly female, minimally responsive. Neuro: Minimally responsive.  Opens eyes to noxious stimuli. HEENT: [place]/AT. PERRL, sclerae anicteric. Cardiovascular: RRR, no M/R/G.  Lungs: Respirations even and unlabored.  Coarse rhonchi R > L. Abdomen: BS x 4, soft, NT/ND.  Musculoskeletal: No gross deformities, no edema.  Skin: Intact, warm, no rashes.  LABS:  CBC  Recent Labs Lab 06/10/14 1540  WBC 1.6*  HGB 14.2  HCT 42.8  PLT 210   Coag's No results for input(s): APTT, INR in the last 168 hours. BMET  Recent Labs Lab 06/10/14 1540  NA 141  K 3.0*  CL 110  CO2 19  BUN 26*  CREATININE 1.17*  GLUCOSE 83   Electrolytes  Recent Labs Lab 06/10/14 1540  CALCIUM 8.7   Sepsis Markers  Recent Labs Lab 06/10/14 1539  LATICACIDVEN 2.47*   ABG  Recent Labs Lab 06/10/14 1815  PHART 7.373  PCO2ART 32.3*  PO2ART 195.0*   Liver Enzymes  Recent Labs Lab 06/10/14 1540  AST 57*  ALT 16  ALKPHOS 45  BILITOT 1.3*  ALBUMIN 3.6   Cardiac Enzymes No results for input(s): TROPONINI, PROBNP in the last 168 hours. Glucose No results for input(s): GLUCAP in the last 168 hours.  Imaging Dg Chest 1 View  06/10/2014   CLINICAL DATA:  Cough.  EXAM: CHEST  1 VIEW  COMPARISON:   01/08/2014  FINDINGS: Diffuse airspace disease throughout the right lung concerning for pneumonia. Possible patchy opacities in the left upper lobe. Heart is borderline in size. No visible effusions. No acute bony abnormality. Aortic calcifications without visible aneurysm.  IMPRESSION: Diffuse right lung airspace disease and possible patchy left upper lobe opacities. Findings most compatible with pneumonia.   Electronically Signed   By: Charlett Nose M.D.   On: 06/10/2014 17:06   Ct Head Wo Contrast  06/10/2014   CLINICAL DATA:  Mental status changes. Disorientation. Incontinence.  EXAM: CT HEAD WITHOUT CONTRAST  CT CERVICAL SPINE WITHOUT CONTRAST  TECHNIQUE: Multidetector CT imaging of the head and cervical spine was performed following the standard  protocol without intravenous contrast. Multiplanar CT image reconstructions of the cervical spine were also generated.  COMPARISON:  None.  FINDINGS: CT HEAD FINDINGS  The brain shows generalized age related atrophy. There is chronic appearing low density in the deep white matter consistent with chronic small vessel disease. No cortical or large vessel territory infarction. No sign of acute infarction, mass lesion, hemorrhage, hydrocephalus or extra-axial collection. Calcification along the falx is incidentally noted, not significant. Sinuses, middle ears and mastoids are clear.  CT CERVICAL SPINE FINDINGS  Alignment is normal. No fracture. No evidence of degenerative disc disease. Mild mid cervical facet degeneration, most notable on the left at C3-4 and C4-5.  IMPRESSION: Head CT: Age related atrophy and chronic small vessel change of the deep white matter. No acute or reversible finding.  Cervical spine CT: No acute or traumatic finding. Minimal degenerative change.   Electronically Signed   By: Paulina FusiMark  Shogry M.D.   On: 06/10/2014 16:36   Ct Cervical Spine Wo Contrast  06/10/2014   CLINICAL DATA:  Mental status changes. Disorientation. Incontinence.  EXAM: CT HEAD  WITHOUT CONTRAST  CT CERVICAL SPINE WITHOUT CONTRAST  TECHNIQUE: Multidetector CT imaging of the head and cervical spine was performed following the standard protocol without intravenous contrast. Multiplanar CT image reconstructions of the cervical spine were also generated.  COMPARISON:  None.  FINDINGS: CT HEAD FINDINGS  The brain shows generalized age related atrophy. There is chronic appearing low density in the deep white matter consistent with chronic small vessel disease. No cortical or large vessel territory infarction. No sign of acute infarction, mass lesion, hemorrhage, hydrocephalus or extra-axial collection. Calcification along the falx is incidentally noted, not significant. Sinuses, middle ears and mastoids are clear.  CT CERVICAL SPINE FINDINGS  Alignment is normal. No fracture. No evidence of degenerative disc disease. Mild mid cervical facet degeneration, most notable on the left at C3-4 and C4-5.  IMPRESSION: Head CT: Age related atrophy and chronic small vessel change of the deep white matter. No acute or reversible finding.  Cervical spine CT: No acute or traumatic finding. Minimal degenerative change.   Electronically Signed   By: Paulina FusiMark  Shogry M.D.   On: 06/10/2014 16:36   Dg Knee Complete 4 Views Right  06/10/2014   CLINICAL DATA:  Right hip pain and right knee pain after a fall.  EXAM: RIGHT KNEE - COMPLETE 4+ VIEW  COMPARISON:  None.  FINDINGS: Tricompartment degenerative changes in the right knee with tricompartment narrowing osteophytosis. No significant effusion. Suggestion of ligamentous laxity in the patellar ligament. Ligamentous disruption is not excluded. No evidence of acute fracture or dislocation. No focal bone lesion or bone destruction.  IMPRESSION: Prominent degenerative changes in the right knee. No significant effusion. No acute bony abnormalities. Suggestion of ligamentous laxity of the patellar ligament.   Electronically Signed   By: Burman NievesWilliam  Stevens M.D.   On:  06/10/2014 17:11   Dg Hip Unilat With Pelvis 2-3 Views Right  06/10/2014   CLINICAL DATA:  Disoriented.  Fall.  Right hip pain.  EXAM: RIGHT HIP (WITH PELVIS) 2-3 VIEWS  COMPARISON:  None.  FINDINGS: Hardware noted within the left femur from prior injury and internal fixation. No acute fracture, subluxation or dislocation. Mild osteopenia. Hip joints and SI joints are symmetric.  IMPRESSION: No acute bony abnormality.   Electronically Signed   By: Charlett NoseKevin  Dover M.D.   On: 06/10/2014 17:11    ASSESSMENT / PLAN:  PULMONARY OETT 3/9 >>> A: Acute hypoxic  respiratory failure likely due to CAP +/- influenza CAP R/o influenza PAH by echo (PAP 54) P:   Full mechanical support, wean as able. VAP bundle. SBT in AM if able. Abx per ID section. ABG and CXR in AM.  CARDIOVASCULAR CVL L IJ 3/9 >>> A:  Sepsis likely due to CAP Elevated BNP (17819) Hx HTN, HLD, systolic CHF (last echo Oct 2015 with EF 35-40%, mild LVH, PAP 54) DNR Status P:  Levophed as needed to maintain goal MAP > 65. Goal CVP 8 - 12. Repeat troponin, lactate.  RENAL A:   Hypokalemia - received 2 runs in ED AKI P:   NS @ 75. BMP in AM.  GASTROINTESTINAL A:   GERD Nutrition P:   SUP: Pantoprazole. NPO. TF if remains NPO > 24 hours.  HEMATOLOGIC A:   Leukopenia - likely related to sepsis but need to r/o myelodysplastic syndrome VTE Prophylaxis P:  Peripheral smear, SPEP, UPEP. SCD's / Heparin. CBC in AM.  INFECTIOUS A:   Sepsis due to CAP P:   BCx2 3/9 > UCx 3/9 > Sputum Cx 3/9 > Abx: Vanc, start date 3/9, day 1/x. Abx: Levaquin, start date 3/9, day 1/x. Abx: Ceftriaxone, start date 3/9, day 1/x. PCT algorithm to limit abx exposure.  ENDOCRINE A:   No known issues P:   SSI if glucose consistently > 180.  NEUROLOGIC A:   Acute metabolic encephalopathy Mild dementia P:   Sedation:  Fentanyl PRN. RASS goal: 0 to -1. Daily WUA.   Family updated: Daughter at  bedside.  Interdisciplinary Family Meeting v Palliative Care Meeting:  Due by: 3/15.   Rutherford Guys, [place] - [facility] [place] [facility] Pager: [phone]  or [phone] 06/10/2014, 9:01 PM   I have had extensive discussions with family daughter, grandaughter. We discussed patients current circumstances and organ failures. We also discussed patient's prior wishes under circumstances such as this. Family has decided to NOT perform resuscitation if arrest but to continue current medical support for now.  STAFF NOTE: I, Rory Percy, MD FACP have personally reviewed patient's available data, including medical history, events of note, physical examination and test results as part of my evaluation. I have discussed with resident/NP and other care providers such as pharmacist, RN and RRT. In addition, I personally evaluated patient and elicited key findings of: rhonchi, severe distress on nimv, prev independent, family sihes ETT, line, support, short term intubation, concern CAO, change zosyn to ceftriaxone, add levo atypical, continue vanc, lactic noted, ensure voluem given, may need pressors and EGDT, r/o flu, temp 103, isolate, treat dose adjusted tamiflu for now, NO ards, follow pcxr however in am , abg to follow, 8 cc/kg, may need peep, concern atx secretions, follow WBC course, may be sepsis related, MDS?, follow cbc The patient is critically ill with multiple organ systems failure and requires high complexity decision making for assessment and support, frequent evaluation and titration of therapies, application of advanced monitoring technologies and extensive interpretation of multiple databases.   Critical Care Time devoted to patient care services described in this note is40 Minutes. This time reflects time of care of this signee: Rory Percy, MD FACP. This critical care time does not reflect procedure time, or teaching time or supervisory time of PA/NP/Med  student/Med Resident etc but could involve care discussion time. Rest per NP/medical resident whose note is outlined above and that I agree with   Mcarthur Rossetti. Tyson Alias, MD, FACP Pgr: [phone]  [place] 06/10/2014 9:32 PM

## 2014-06-10 NOTE — ED Notes (Signed)
Per EMS, pt's daughter states the pt has not been acting her baseline since last night. Daughter states the pt is usually oriented and walks with a walker. EMS states the pt is alert and follows commands but is not oriented to self, place or time. Pt was sitting in urine and feces upon EMS arrival. Pt was 88% on RA, resp 44.  Pt lives with daughter.

## 2014-06-10 NOTE — Procedures (Signed)
Central Venous Catheter Insertion Procedure Note Traci Hale [id number] Nov 15, 1924  Procedure: Insertion of Central Venous Catheter Indications: Assessment of intravascular volume, Drug and/or fluid administration and Frequent blood sampling  Procedure Details Consent: Risks of procedure as well as the alternatives and risks of each were explained to the (patient/caregiver).  Consent for procedure obtained. Time Out: Verified patient identification, verified procedure, site/side was marked, verified correct patient position, special equipment/implants available, medications/allergies/relevent history reviewed, required imaging and test results available.  Performed  Maximum sterile technique was used including antiseptics, cap, gloves, gown, hand hygiene, mask and sheet. Skin prep: Chlorhexidine; local anesthetic administered A antimicrobial bonded/coated triple lumen catheter was placed in the left internal jugular vein using the Seldinger technique.  Evaluation Blood flow good Complications: No apparent complications Patient did tolerate procedure well. Chest X-ray ordered to verify placement.  CXR: pending.  Nelda BucksFEINSTEIN,Yolunda Kloos J. 06/10/2014, 9:31 PM  US Consent daughter  Mcarthur RossettiDaniel J. Tyson AliasFeinstein, MD, FACP Pgr: [phone] [place]

## 2014-06-10 NOTE — ED Notes (Signed)
Called ICU/SD to give report.  CN requested to return call in a few

## 2014-06-10 NOTE — ED Provider Notes (Signed)
CSN: [id number]     Arrival date & time 06/10/14  1456 History   First MD Initiated Contact with Patient 06/10/14 1515     Chief Complaint  Patient presents with  . Altered Mental Status  . Fever     (Consider location/radiation/quality/duration/timing/severity/associated sxs/prior Treatment) HPI Comments: Patient is a history of hypertension CHF and some dementia who lives at home with family presents with altered mental status. Her daughter states that she noticed that she was less alert last night and felt a little warm this morning. She's much less oriented this morning. She has confusion and is less alert than normal. She normally ambulates on her own and is communicative but is confused at times but today she is not able to sit up on her own or get out of bed and she's not communicating normally. She was noted to have a subjective fever. She's had no vomiting. She had some incontinence both bowel and bladder in her bed this morning. She's had a cough for the last few days. Her daughter also states she's had 2 recent falls over the last week. One time she was down in the kitchen, this was unwitnessed fall. Also a couple days ago she was found that she brought out of bed on the floor. She's been complaining of some right sided leg pain although she has been ambulating on it. It's unknown whether she hit her head.  Patient is a 81 y.o. female presenting with altered mental status and fever.  Altered Mental Status Fever   Past Medical History  Diagnosis Date  . Hypertension   . CHF (congestive heart failure)   . Hyperlipidemia   . Heart murmur   . Shortness of breath   . GERD (gastroesophageal reflux disease)    Past Surgical History  Procedure Laterality Date  . Leg surgery     Family History  Problem Relation Age of Onset  . Hypertension Father   . Breast cancer Daughter    History  Substance Use Topics  . Smoking status: Former Games developer  . Smokeless tobacco: Never Used      Comment: quit smoking in the 80"s   . Alcohol Use: Yes     Comment: occasional   OB History    No data available     Review of Systems  Unable to perform ROS: Mental status change      Allergies  Review of patient's allergies indicates no known allergies.  Home Medications   Prior to Admission medications   Medication Sig Start Date End Date Taking? Authorizing Provider  aspirin EC 81 MG EC tablet Take 1 tablet (81 mg total) by mouth daily. 01/10/14  Yes Vassie Loll, MD  Calcium Carbonate-Vitamin D (CALCIUM 600+D) 600-200 MG-UNIT TABS Take 1 tablet by mouth 2 (two) times daily.   Yes Historical Provider, MD  carvedilol (COREG) 12.5 MG tablet Take 0.5 tablets (6.25 mg total) by mouth 2 (two) times daily with a meal. Patient taking differently: Take 12.5 mg by mouth 2 (two) times daily.  01/10/14  Yes Vassie Loll, MD  Cholecalciferol (VITAMIN D-3) 5000 UNITS TABS Take 1 tablet by mouth daily.   Yes Historical Provider, MD  feeding supplement, ENSURE COMPLETE, (ENSURE COMPLETE) LIQD Take 237 mLs by mouth 2 (two) times daily between meals. 01/10/14  Yes Vassie Loll, MD  Folic Acid-Vit B6-Vit B12 (FOLGARD) 0.8-10-0.115 MG TABS Take 1 tablet by mouth daily.   Yes Historical Provider, MD  furosemide (LASIX) 20 MG tablet Take 1  tablet (20 mg total) by mouth 3 (three) times a week. Patient taking differently: Take 20 mg by mouth daily.  01/26/14  Yes Amy D Clegg, NP  lisinopril (PRINIVIL,ZESTRIL) 5 MG tablet Take 1 tablet (5 mg total) by mouth daily. 01/10/14  Yes Vassie Loll, MD  NIFEdipine (ADALAT CC) 90 MG 24 hr tablet Take 1 tablet by mouth daily. 06/05/14  Yes Historical Provider, MD  Omega 3-6-9 Fatty Acids (OMEGA-3 FUSION) LIQD Take 5 mLs by mouth daily. 1000mg  omega 3 liquid   Yes Historical Provider, MD  Probiotic Product (PROBIOTIC DAILY PO) Take 1 capsule by mouth daily.   Yes Historical Provider, MD  simvastatin (ZOCOR) 20 MG tablet Take 20 mg by mouth daily.   Yes  Historical Provider, MD   BP 100/69 mmHg  Pulse 88  Temp(Src) 103.5 F (39.7 C) (Rectal)  Resp 27  Ht 5\' 2"  (1.575 m)  Wt 116 lb 10 oz (52.9 kg)  BMI 21.33 kg/m2  SpO2 96% Physical Exam  Constitutional: She appears well-developed.  Thin  HENT:  Head: Normocephalic and atraumatic.  Eyes: Pupils are equal, round, and reactive to light.  Neck:  No obvious tenderness along the cervical thoracic or lumbosacral spine. No step-offs or deformities  Cardiovascular: Normal rate, regular rhythm and normal heart sounds.   Pulmonary/Chest: Effort normal and breath sounds normal. No respiratory distress. She has no wheezes. She has no rales. She exhibits no tenderness.  Positive rhonchi bilaterally  Abdominal: Soft. Bowel sounds are normal. There is no tenderness. There is no rebound and no guarding.  Musculoskeletal: Normal range of motion. She exhibits no edema.  Positive tenderness on range of motion of the right leg. I can't tell whether the tenderness is coming from her knee or hip. There doesn't seem to be any tenderness or swelling around the ankle. Pedal pulses are intact. There is no other pain on palpation or range of motion of her extremities  Lymphadenopathy:    She has no cervical adenopathy.  Neurological:  Patient is laying in bed with her eyes closed but she will open her eyes to verbal stimuli and will answer some simple questions. She is confused.  Skin: Skin is warm and dry. No rash noted.  Psychiatric: She has a normal mood and affect.    ED Course  Procedures (including critical care time) Labs Review Labs Reviewed  CBC WITH DIFFERENTIAL/PLATELET - Abnormal; Notable for the following:    WBC 1.6 (*)    MCV 104.9 (*)    MCH 34.8 (*)    Neutro Abs 1.2 (*)    Lymphs Abs 0.3 (*)    All other components within normal limits  COMPREHENSIVE METABOLIC PANEL - Abnormal; Notable for the following:    Potassium 3.0 (*)    BUN 26 (*)    Creatinine, Ser 1.17 (*)    Total  Protein 8.4 (*)    AST 57 (*)    Total Bilirubin 1.3 (*)    GFR calc non Af Amer 41 (*)    GFR calc Af Amer 47 (*)    All other components within normal limits  URINALYSIS, ROUTINE W REFLEX MICROSCOPIC - Abnormal; Notable for the following:    APPearance TURBID (*)    Hgb urine dipstick LARGE (*)    Ketones, ur 15 (*)    Protein, ur >300 (*)    All other components within normal limits  BLOOD GAS, ARTERIAL - Abnormal; Notable for the following:    pCO2 arterial  32.3 (*)    pO2, Arterial 195.0 (*)    Bicarbonate 18.3 (*)    Acid-base deficit 5.5 (*)    All other components within normal limits  URINE MICROSCOPIC-ADD ON - Abnormal; Notable for the following:    Bacteria, UA MANY (*)    All other components within normal limits  BLOOD GAS, ARTERIAL - Abnormal; Notable for the following:    pH, Arterial 7.342 (*)    pCO2 arterial 32.0 (*)    pO2, Arterial 105.0 (*)    Bicarbonate 16.9 (*)    Acid-base deficit 7.5 (*)    All other components within normal limits  I-STAT CG4 LACTIC ACID, ED - Abnormal; Notable for the following:    Lactic Acid, Venous 2.47 (*)    All other components within normal limits  CULTURE, BLOOD (ROUTINE X 2)  CULTURE, BLOOD (ROUTINE X 2)  URINE CULTURE  RESPIRATORY VIRUS PANEL  CULTURE, RESPIRATORY (NON-EXPECTORATED)  CULTURE, EXPECTORATED SPUTUM-ASSESSMENT  GRAM STAIN  MRSA PCR SCREENING  LACTIC ACID, PLASMA  MAGNESIUM  PHOSPHORUS  PROCALCITONIN  PROCALCITONIN  PROTEIN ELECTROPHORESIS, SERUM  IMMUNOFIXATION ELECTROPHORESIS, URINE (WITH TOT PROT)  TECHNOLOGIST SMEAR REVIEW  FOLATE  VITAMIN B12  LEGIONELLA ANTIGEN, URINE  STREP PNEUMONIAE URINARY ANTIGEN  COMPREHENSIVE METABOLIC PANEL  CBC WITH DIFFERENTIAL/PLATELET    Imaging Review Dg Chest 1 View  06/10/2014   CLINICAL DATA:  Cough.  EXAM: CHEST  1 VIEW  COMPARISON:  01/08/2014  FINDINGS: Diffuse airspace disease throughout the right lung concerning for pneumonia. Possible patchy opacities  in the left upper lobe. Heart is borderline in size. No visible effusions. No acute bony abnormality. Aortic calcifications without visible aneurysm.  IMPRESSION: Diffuse right lung airspace disease and possible patchy left upper lobe opacities. Findings most compatible with pneumonia.   Electronically Signed   By: Charlett Nose M.D.   On: 06/10/2014 17:06   Dg Abd 1 View  06/10/2014   CLINICAL DATA:  OG tube placement.  EXAM: ABDOMEN - 1 VIEW  COMPARISON:  None.  FINDINGS: The tip of the enteric tube is below the diaphragm likely in the stomach, the side port is in the region of the gastroesophageal junction. There is a nonobstructive bowel gas pattern. Lower thorax is rotated.  IMPRESSION: Tip of the enteric tube below the diaphragm, side-port in the region of the gastroesophageal junction. Advancement of 3-5 cm recommended for optimal placement.   Electronically Signed   By: Rubye Oaks M.D.   On: 06/10/2014 22:24   Ct Head Wo Contrast  06/10/2014   CLINICAL DATA:  Mental status changes. Disorientation. Incontinence.  EXAM: CT HEAD WITHOUT CONTRAST  CT CERVICAL SPINE WITHOUT CONTRAST  TECHNIQUE: Multidetector CT imaging of the head and cervical spine was performed following the standard protocol without intravenous contrast. Multiplanar CT image reconstructions of the cervical spine were also generated.  COMPARISON:  None.  FINDINGS: CT HEAD FINDINGS  The brain shows generalized age related atrophy. There is chronic appearing low density in the deep white matter consistent with chronic small vessel disease. No cortical or large vessel territory infarction. No sign of acute infarction, mass lesion, hemorrhage, hydrocephalus or extra-axial collection. Calcification along the falx is incidentally noted, not significant. Sinuses, middle ears and mastoids are clear.  CT CERVICAL SPINE FINDINGS  Alignment is normal. No fracture. No evidence of degenerative disc disease. Mild mid cervical facet degeneration,  most notable on the left at C3-4 and C4-5.  IMPRESSION: Head CT: Age related atrophy and chronic small  vessel change of the deep white matter. No acute or reversible finding.  Cervical spine CT: No acute or traumatic finding. Minimal degenerative change.   Electronically Signed   By: Paulina Fusi M.D.   On: 06/10/2014 16:36   Ct Cervical Spine Wo Contrast  06/10/2014   CLINICAL DATA:  Mental status changes. Disorientation. Incontinence.  EXAM: CT HEAD WITHOUT CONTRAST  CT CERVICAL SPINE WITHOUT CONTRAST  TECHNIQUE: Multidetector CT imaging of the head and cervical spine was performed following the standard protocol without intravenous contrast. Multiplanar CT image reconstructions of the cervical spine were also generated.  COMPARISON:  None.  FINDINGS: CT HEAD FINDINGS  The brain shows generalized age related atrophy. There is chronic appearing low density in the deep white matter consistent with chronic small vessel disease. No cortical or large vessel territory infarction. No sign of acute infarction, mass lesion, hemorrhage, hydrocephalus or extra-axial collection. Calcification along the falx is incidentally noted, not significant. Sinuses, middle ears and mastoids are clear.  CT CERVICAL SPINE FINDINGS  Alignment is normal. No fracture. No evidence of degenerative disc disease. Mild mid cervical facet degeneration, most notable on the left at C3-4 and C4-5.  IMPRESSION: Head CT: Age related atrophy and chronic small vessel change of the deep white matter. No acute or reversible finding.  Cervical spine CT: No acute or traumatic finding. Minimal degenerative change.   Electronically Signed   By: Paulina Fusi M.D.   On: 06/10/2014 16:36   Dg Chest Port 1 View  06/10/2014   CLINICAL DATA:  Line placement and endotracheal tube position.  EXAM: PORTABLE CHEST - 1 VIEW  COMPARISON:  06/10/2014  FINDINGS: Interval placement of an endotracheal tube with tip measuring 3.7 cm above the carina. A left central venous  catheter was placed with tip over the mid SVC region. No pneumothorax. Airspace disease in the right lung suggesting asymmetric edema versus pneumonia. No blunting of costophrenic angles. Normal heart size and pulmonary vascularity. Calcified and tortuous aorta.  IMPRESSION: Appliances appear to be in satisfactory location. Persistent consolidation in the right lung suggesting pneumonia or asymmetric edema.   Electronically Signed   By: Burman Nieves M.D.   On: 06/10/2014 22:21   Dg Knee Complete 4 Views Right  06/10/2014   CLINICAL DATA:  Right hip pain and right knee pain after a fall.  EXAM: RIGHT KNEE - COMPLETE 4+ VIEW  COMPARISON:  None.  FINDINGS: Tricompartment degenerative changes in the right knee with tricompartment narrowing osteophytosis. No significant effusion. Suggestion of ligamentous laxity in the patellar ligament. Ligamentous disruption is not excluded. No evidence of acute fracture or dislocation. No focal bone lesion or bone destruction.  IMPRESSION: Prominent degenerative changes in the right knee. No significant effusion. No acute bony abnormalities. Suggestion of ligamentous laxity of the patellar ligament.   Electronically Signed   By: Burman Nieves M.D.   On: 06/10/2014 17:11   Dg Hip Unilat With Pelvis 2-3 Views Right  06/10/2014   CLINICAL DATA:  Disoriented.  Fall.  Right hip pain.  EXAM: RIGHT HIP (WITH PELVIS) 2-3 VIEWS  COMPARISON:  None.  FINDINGS: Hardware noted within the left femur from prior injury and internal fixation. No acute fracture, subluxation or dislocation. Mild osteopenia. Hip joints and SI joints are symmetric.  IMPRESSION: No acute bony abnormality.   Electronically Signed   By: Charlett Nose M.D.   On: 06/10/2014 17:11     EKG Interpretation None      Date: 06/10/2014  Rate: 111  Rhythm: sinus tachycardia  QRS Axis: normal  Intervals: normal  ST/T Wave abnormalities: nonspecific ST/T changes  Conduction Disutrbances:none  Narrative  Interpretation:   Old EKG Reviewed: changes noted    MDM   Final diagnoses:  Cough  Fall  Sepsis, due to unspecified organism  Community acquired pneumonia    Patient presents with evidence of sepsis. Sepsis order set was put into place and patient was started on protocol orders. She was given IV fluid boluses as well as broad-spectrum antibiotics. Her initial saturations were in the upper 80s and low 90s on a nonrebreather mass. She then dropped into the low 70s on a nonrebreather mask. She had some increased work of breathing. I had a discussion with the family about her CODE STATUS. The family at this point is requesting that everything be done for the patient including intubation if necessary. Patient I feel is awake enough to try BiPAP and this was started on the patient. She did have some significant improvement on the BiPAP and her oxygen saturations improved to the mid 90s. She was still awake and alert and able to protect her airway. I consulted with Dr. Sung AmabileSimonds with critical care medicine. He requested this point I consult the hospitalist for admission and they would be more than happy to consult if needed.  I spoke with Dr. Adela Glimpseoutova who will admit the pt.    CRITICAL CARE Performed by: Shunda Rabadi Total critical care time: 60 Critical care time was exclusive of separately billable procedures and treating other patients. Critical care was necessary to treat or prevent imminent or life-threatening deterioration. Critical care was time spent personally by me on the following activities: development of treatment plan with patient and/or surrogate as well as nursing, discussions with consultants, evaluation of patient's response to treatment, examination of patient, obtaining history from patient or surrogate, ordering and performing treatments and interventions, ordering and review of laboratory studies, ordering and review of radiographic studies, pulse oximetry and re-evaluation of  patient's condition.     Rolan BuccoMelanie Magdalene Tardiff, MD 06/10/14 [phone]

## 2014-06-10 NOTE — Procedures (Signed)
Intubation Procedure Note Creed CopperOphelia G Singleterry [id number] 1924-08-25  Procedure: Intubation Indications: Respiratory insufficiency  Procedure Details Consent: Risks of procedure as well as the alternatives and risks of each were explained to the (patient/caregiver).  Consent for procedure obtained. Time Out: Verified patient identification, verified procedure, site/side was marked, verified correct patient position, special equipment/implants available, medications/allergies/relevent history reviewed, required imaging and test results available.  Performed  Maximum sterile technique was used including cap, gloves, gown and hand hygiene.  MAC and 3    Evaluation Hemodynamic Status: BP stable throughout; O2 sats: stable throughout Patient's Current Condition: stable Complications: No apparent complications Patient did tolerate procedure well. Chest X-ray ordered to verify placement.  CXR: pending.   Nelda BucksFEINSTEIN,Davonn Flanery J. 06/10/2014  Consented daughter Short term only If failing - comfort  Mcarthur RossettiDaniel J. Tyson AliasFeinstein, MD, FACP Pgr: [phone] [facility]

## 2014-06-10 NOTE — ED Notes (Signed)
Notified campos of abnormal lab

## 2014-06-10 NOTE — Progress Notes (Signed)
ANTIBIOTIC CONSULT NOTE - INITIAL  Pharmacy Consult for vancomycin/Zosyn Indication: sepsis  No Known Allergies  Patient Measurements:   Adjusted Body Weight: n/a  Vital Signs: Temp: 103.5 F (39.7 C) (03/09 1501) Temp Source: Rectal (03/09 1501) BP: 143/60 mmHg (03/09 1501) Pulse Rate: 109 (03/09 1501) Intake/Output from previous day:   Intake/Output from this shift:    Labs: No results for input(s): WBC, HGB, PLT, LABCREA, CREATININE in the last 72 hours. CrCl cannot be calculated (Unknown ideal weight.). No results for input(s): VANCOTROUGH, VANCOPEAK, VANCORANDOM, GENTTROUGH, GENTPEAK, GENTRANDOM, TOBRATROUGH, TOBRAPEAK, TOBRARND, AMIKACINPEAK, AMIKACINTROU, AMIKACIN in the last 72 hours.   Microbiology: No results found for this or any previous visit (from the past 720 hour(s)).  Medical History: Past Medical History  Diagnosis Date  . Hypertension   . CHF (congestive heart failure)   . Hyperlipidemia   . Heart murmur   . Shortness of breath   . GERD (gastroesophageal reflux disease)     Medications:  Scheduled:   Infusions:  . piperacillin-tazobactam 3.375 g (06/10/14 1555)  . sodium chloride 1,000 mL (06/10/14 1548)  . vancomycin      Assessment: 89yoF presenting with AMS and fever.  Endorses cough for last few days, no vomiting.  Pharmacy to dose vancomycin and Zosyn for sepsis.  PMH CHF, HTN Patient met criteria for sepsis with fever, tachypnea, and WBC < 4k.  Code sepsis called at 1600; first doses of antibiotics given in ED  3/9 >> vancomycin >> 3/9 >> Zosyn >>  Temp: 103.5 on arrival WBC: 1.6 low Renal: SCr 1.17; CrCl 27 ml/min CG SpO2: 88% on arrival, now >90% on 3L [place] 2.47  3/9 blood x2: IP 3/9 urine: IP (UA w/ neg leukocytes but many bacteria) 3/9 strep UAg:  3/9 legionella UAg:    Goal of Therapy:   Vancomycin trough level 15-20 mcg/ml   Eradication of infection  Appropriate antibiotic dosing for indication and renal  function  Plan:   Vancomycin 500 mg IV q24, starting tomorrow at 16:00  Zosyn 3.375 g IV q8h by 4-hr infusion, to start 6 hrs after initial dose  Measure vancomycin trough levels at steady state as indicated  Follow clinical course, culture results as available, renal function  Follow for de-escalation of antibiotics and LOT   Reuel Boom, PharmD Pager: [phone] 06/10/2014, 4:03 PM

## 2014-06-10 NOTE — Progress Notes (Signed)
EDCM spoke to patient's daughter Sedalia MutaDiane [phone] (home during the day) and grand daughter at bedside.  Patient in xray.  Per patient's family, patient lives at home with her grand daughter [name].  [facility]  Patient does not have home health services at this time.  According to patient's family, patient is usually independent and able to perform her ADL's on her own without difficulty up until a few days ago.  Patient has a walker, cane, and elevated toilet seat at home.  Patient's family confirm patient's pcp is Dr. Cain Saupeammie Fulp.  EDCM provided patient's family [place] and a list of private duty nursing agencies, which patient's grand daughter reports she would like to arrange someone to stay with the patient from 10am-2pm.  Patient's family thankful for resources.  No further EDCM needs at this time.

## 2014-06-11 ENCOUNTER — Inpatient Hospital Stay (HOSPITAL_COMMUNITY): Payer: Commercial Managed Care - HMO

## 2014-06-11 DIAGNOSIS — A419 Sepsis, unspecified organism: Secondary | ICD-10-CM

## 2014-06-11 DIAGNOSIS — R6521 Severe sepsis with septic shock: Secondary | ICD-10-CM

## 2014-06-11 DIAGNOSIS — I5022 Chronic systolic (congestive) heart failure: Secondary | ICD-10-CM

## 2014-06-11 LAB — MRSA PCR SCREENING: MRSA BY PCR: NEGATIVE

## 2014-06-11 LAB — CBC WITH DIFFERENTIAL/PLATELET
Basophils Absolute: 0 10*3/uL (ref 0.0–0.1)
Basophils Relative: 0 % (ref 0–1)
Eosinophils Absolute: 0 10*3/uL (ref 0.0–0.7)
Eosinophils Relative: 1 % (ref 0–5)
HCT: 35.4 % — ABNORMAL LOW (ref 36.0–46.0)
HEMOGLOBIN: 11.6 g/dL — AB (ref 12.0–15.0)
Lymphocytes Relative: 24 % (ref 12–46)
Lymphs Abs: 0.4 10*3/uL — ABNORMAL LOW (ref 0.7–4.0)
MCH: 33.9 pg (ref 26.0–34.0)
MCHC: 32.8 g/dL (ref 30.0–36.0)
MCV: 103.5 fL — ABNORMAL HIGH (ref 78.0–100.0)
MONOS PCT: 1 % — AB (ref 3–12)
Monocytes Absolute: 0 10*3/uL — ABNORMAL LOW (ref 0.1–1.0)
Neutro Abs: 1.4 10*3/uL — ABNORMAL LOW (ref 1.7–7.7)
Neutrophils Relative %: 74 % (ref 43–77)
PLATELETS: 183 10*3/uL (ref 150–400)
RBC: 3.42 MIL/uL — AB (ref 3.87–5.11)
RDW: 13.9 % (ref 11.5–15.5)
WBC: 1.8 10*3/uL — AB (ref 4.0–10.5)

## 2014-06-11 LAB — GLUCOSE, CAPILLARY
GLUCOSE-CAPILLARY: 67 mg/dL — AB (ref 70–99)
GLUCOSE-CAPILLARY: 72 mg/dL (ref 70–99)
GLUCOSE-CAPILLARY: 63 mg/dL — AB (ref 70–99)
Glucose-Capillary: 65 mg/dL — ABNORMAL LOW (ref 70–99)
Glucose-Capillary: 97 mg/dL (ref 70–99)
Glucose-Capillary: 92 mg/dL (ref 70–99)
Glucose-Capillary: 71 mg/dL (ref 70–99)
Glucose-Capillary: 148 mg/dL — ABNORMAL HIGH (ref 70–99)

## 2014-06-11 LAB — MAGNESIUM: Magnesium: 1.6 mg/dL (ref 1.5–2.5)

## 2014-06-11 LAB — COMPREHENSIVE METABOLIC PANEL
ALT: 15 U/L (ref 0–35)
AST: 71 U/L — AB (ref 0–37)
Albumin: 2.5 g/dL — ABNORMAL LOW (ref 3.5–5.2)
Alkaline Phosphatase: 27 U/L — ABNORMAL LOW (ref 39–117)
Anion gap: 9 (ref 5–15)
BILIRUBIN TOTAL: 0.8 mg/dL (ref 0.3–1.2)
BUN: 33 mg/dL — AB (ref 6–23)
CHLORIDE: 113 mmol/L — AB (ref 96–112)
CO2: 18 mmol/L — ABNORMAL LOW (ref 19–32)
CREATININE: 1.45 mg/dL — AB (ref 0.50–1.10)
Calcium: 7.4 mg/dL — ABNORMAL LOW (ref 8.4–10.5)
GFR, EST AFRICAN AMERICAN: 36 mL/min — AB (ref >90)
GFR, EST NON AFRICAN AMERICAN: 31 mL/min — AB (ref >90)
Glucose, Bld: 107 mg/dL — ABNORMAL HIGH (ref 70–99)
Potassium: 4.2 mmol/L (ref 3.5–5.1)
SODIUM: 140 mmol/L (ref 135–145)
Total Protein: 6.6 g/dL (ref 6.0–8.3)

## 2014-06-11 LAB — FOLATE: FOLATE: 13.9 ng/mL

## 2014-06-11 LAB — PHOSPHORUS: Phosphorus: 3.5 mg/dL (ref 2.3–4.6)

## 2014-06-11 LAB — CARBOXYHEMOGLOBIN
CARBOXYHEMOGLOBIN: 0.8 % (ref 0.5–1.5)
Methemoglobin: 0.7 % (ref 0.0–1.5)
O2 Saturation: 73 %
TOTAL HEMOGLOBIN: 12 g/dL (ref 12.0–16.0)

## 2014-06-11 LAB — VITAMIN B12: Vitamin B-12: 1154 pg/mL — ABNORMAL HIGH (ref 211–911)

## 2014-06-11 LAB — PROCALCITONIN

## 2014-06-11 LAB — TECHNOLOGIST SMEAR REVIEW: TECH REVIEW: NONE SEEN

## 2014-06-11 LAB — LACTIC ACID, PLASMA: LACTIC ACID, VENOUS: 2.4 mmol/L — AB (ref 0.5–2.0)

## 2014-06-11 LAB — STREP PNEUMONIAE URINARY ANTIGEN: STREP PNEUMO URINARY ANTIGEN: NEGATIVE

## 2014-06-11 MED ORDER — VITAL HIGH PROTEIN PO LIQD
1000.0000 mL | ORAL | Status: DC
  Filled 2014-06-11: qty 1000

## 2014-06-11 MED ORDER — DEXTROSE 50 % IV SOLN
25.0000 mL | Freq: Once | INTRAVENOUS | Status: AC
  Administered 2014-06-11: 25 mL via INTRAVENOUS

## 2014-06-11 MED ORDER — ACETAMINOPHEN 160 MG/5ML PO SOLN
650.0000 mg | ORAL | Status: DC | PRN
  Administered 2014-06-11: 650 mg
  Filled 2014-06-11: qty 20.3

## 2014-06-11 MED ORDER — IPRATROPIUM-ALBUTEROL 0.5-2.5 (3) MG/3ML IN SOLN
3.0000 mL | RESPIRATORY_TRACT | Status: DC
  Administered 2014-06-11 – 2014-06-12 (×6): 3 mL via RESPIRATORY_TRACT
  Filled 2014-06-11 (×6): qty 3

## 2014-06-11 MED ORDER — DEXTROSE 50 % IV SOLN
INTRAVENOUS | Status: AC
  Filled 2014-06-11: qty 50

## 2014-06-11 MED ORDER — VITAL AF 1.2 CAL PO LIQD
1000.0000 mL | ORAL | Status: DC
  Administered 2014-06-11: 1000 mL
  Filled 2014-06-11 (×2): qty 1000

## 2014-06-11 MED ORDER — PRO-STAT SUGAR FREE PO LIQD
30.0000 mL | Freq: Every day | ORAL | Status: DC
  Administered 2014-06-11 – 2014-06-12 (×2): 30 mL
  Filled 2014-06-11 (×3): qty 30

## 2014-06-11 MED ORDER — SODIUM CHLORIDE 0.9 % IV BOLUS (SEPSIS)
500.0000 mL | Freq: Once | INTRAVENOUS | Status: AC
  Administered 2014-06-11: 500 mL via INTRAVENOUS

## 2014-06-11 MED ORDER — CEFTRIAXONE SODIUM IN DEXTROSE 40 MG/ML IV SOLN
2.0000 g | INTRAVENOUS | Status: DC
  Administered 2014-06-11 – 2014-06-12 (×2): 2 g via INTRAVENOUS
  Filled 2014-06-11 (×2): qty 50

## 2014-06-11 MED ORDER — DEXTROSE 50 % IV SOLN
1.0000 | Freq: Once | INTRAVENOUS | Status: AC
  Administered 2014-06-11: 50 mL via INTRAVENOUS

## 2014-06-11 NOTE — Consult Note (Signed)
PULMONARY / CRITICAL CARE MEDICINE   Name: Traci Hale MRN: [id number] DOB: December 07, 1924    ADMISSION DATE:  06/10/2014 CONSULTATION DATE:  06/11/2014  REFERRING MD :  Adela Glimpse  CHIEF COMPLAINT:  Confusion, cough  INITIAL PRESENTATION:  81 y.o. F from home brought to [facility] ED 3/9 for worsening confusion, cough.  In ED, found to have right sided PNA, febrile to 103.  Initially placed on BiPAP; however, remained hypoxic despite PEEP 10.  Decision made to intubate for short term support.  Code status DNR.   STUDIES:  CXR 3/9 >>> diffuse R sided lung disease, ? Patchy LUL opacity CT head/c-spine 3/9 >>> no acute findings R Knee 3/9 >>> no acute findings R hip 3/9 >>> no acute findings  SIGNIFICANT EVENTS: 3/9 - admit, intubated  SUBJECTIVE:  Off pressors, tachypneic on vent, air trapping some  VITAL SIGNS: Temp:  [98.6 F (37 C)-103.5 F (39.7 C)] 99 F (37.2 C) (03/10 0900) Pulse Rate:  [69-110] 85 (03/10 0900) Resp:  [12-40] 25 (03/10 0900) BP: (76-143)/(41-90) 135/68 mmHg (03/10 0900) SpO2:  [88 %-100 %] 99 % (03/10 0900) FiO2 (%):  [60 %-100 %] 60 % (03/10 0900) Weight:  [52.9 kg (116 lb 10 oz)-58.2 kg (128 lb 4.9 oz)] 58.2 kg (128 lb 4.9 oz) (03/10 0500) HEMODYNAMICS: CVP:  [4 mmHg-16 mmHg] 4 mmHg VENTILATOR SETTINGS: Vent Mode:  [-] PRVC FiO2 (%):  [60 %-100 %] 60 % Set Rate:  [15 bmp-16 bmp] 16 bmp Vt Set:  [400 mL] 400 mL PEEP:  [5 cmH20-10 cmH20] 5 cmH20 Plateau Pressure:  [18 cmH20-20 cmH20] 18 cmH20 INTAKE / OUTPUT: Intake/Output      03/09 0701 - 03/10 0700 03/10 0701 - 03/11 0700   I.V. (mL/kg) 2232.1 (38.4) 150 (2.6)   IV Piggyback 600    Total Intake(mL/kg) 2832.1 (48.7) 150 (2.6)   Urine (mL/kg/hr) 565 55 (0.4)   Total Output 565 55   Net +2267.1 +95          PHYSICAL EXAMINATION: Gen: frail female on vent HEENT: NCAT EOMi, ETT in place PULM: Wheezing L > R, crackles R base CV: Tachy, regular AB: BS+, soft, nontender Ext: cool, no edema Neuro:  Awake on vent, follows commands moves all four ext  LABS:  CBC  Recent Labs Lab 06/10/14 1540 06/11/14 0510  WBC 1.6* 1.8*  HGB 14.2 11.6*  HCT 42.8 35.4*  PLT 210 183   Coag's No results for input(s): APTT, INR in the last 168 hours. BMET  Recent Labs Lab 06/10/14 1540 06/11/14 0510  NA 141 140  K 3.0* 4.2  CL 110 113*  CO2 19 18*  BUN 26* 33*  CREATININE 1.17* 1.45*  GLUCOSE 83 107*   Electrolytes  Recent Labs Lab 06/10/14 1540 06/11/14 0510  CALCIUM 8.7 7.4*  MG  --  1.6  PHOS  --  3.5   Sepsis Markers  Recent Labs Lab 06/10/14 1539 06/10/14 2220 06/11/14 0510  LATICACIDVEN 2.47* 2.4*  --   PROCALCITON  --  >175.00 >175.00   ABG  Recent Labs Lab 06/10/14 1815 06/10/14 2205  PHART 7.373 7.342*  PCO2ART 32.3* 32.0*  PO2ART 195.0* 105.0*   Liver Enzymes  Recent Labs Lab 06/10/14 1540 06/11/14 0510  AST 57* 71*  ALT 16 15  ALKPHOS 45 27*  BILITOT 1.3* 0.8  ALBUMIN 3.6 2.5*   Cardiac Enzymes No results for input(s): TROPONINI, PROBNP in the last 168 hours. Glucose  Recent Labs Lab 06/10/14 2323 06/11/14  [id number] 06/11/14 0849  GLUCAP 92 65* 97    Imaging   ASSESSMENT / PLAN:  PULMONARY OETT 3/9 >>> A: Acute hypoxic respiratory failure likely due to Severe CAP +/- influenza Wheezing and air-trapping 3/10, no history of asthma or emphysema PAH by echo (PAP 54) P:   Continue full mechanical vent support Wean FiO2 as able Add prn bronchodilators 3/10 VAP bundle SBT/WUA daily Daily CXR  CARDIOVASCULAR CVL L IJ 3/9 >>> A:  Septic and cardiogenic shock due to CAP > resolved Elevated BNP (17819) Hx HTN, HLD, systolic CHF (last echo Oct 2015 with EF 35-40%, mild LVH, PAP 54) DNR Status P:  Monitor CVP, goal ~8 3/10 Send [place] Tele  RENAL A:   Hypokalemia - resolved AKI > oliguric, worrisome for ATN P:   Bolus 500cc NS 3/10 Continue NS @ 75 Monitor BMET and UOP Replace electrolytes as  needed  GASTROINTESTINAL A:   GERD Nutrition needs P:   SUP: Pantoprazole NPO Start tube feedings 3/10  HEMATOLOGIC A:   Leukopenia> left shift on smear, likely due to severe CAP/sepsis, but if does not recover may need leukemia work-up VTE Prophylaxis P:  SCD's / Heparin CBC in AM  INFECTIOUS A:   Sepsis due to Severe CAP P:   BCx2 3/9 > UCx 3/9 > Sputum Cx 3/9 > Respiratory viral panel 3/9 > Urine strep 3/10 > Urine leg 3/10 >  Abx: Vanc, start date 3/9, day 2/x Abx: Levaquin, start date 3/9, day 2/x Abx: Ceftriaxone, start date 3/9, day 2/x Avx: Tamiflu 3/9 >  PCT algorithm to limit abx exposure Send urine strep/leg 3/10  ENDOCRINE A:   No known issues P:   SSI if glucose consistently > 180  NEUROLOGIC A:   Acute metabolic encephalopathy Mild dementia P:   Sedation:  Fentanyl PRN RASS goal: -1 Daily WUA   Family updated: Daughter at bedside 3/10  Interdisciplinary Family Meeting v Palliative Care Meeting:  Due by: 3/15.   Critical Care Time devoted to patient care services described in this note is40 Minutes.   Heber CarolinaBrent Coleson Kant, MD [place] P[facility]M Pager: [phone] Cell: [phone] If no response, call [phone]   06/11/2014 9:35 AM

## 2014-06-11 NOTE — Progress Notes (Signed)
INITIAL NUTRITION ASSESSMENT  DOCUMENTATION CODES Per approved criteria  -Severe malnutrition in the context of chronic illness  Pt meets criteria for severe malnutrition in the context of chronic illness as evidenced by severe muscle mass and subcutaneous body fat depletion.  INTERVENTION: D/C Vital HP  Initiate Vital AF 1.2 via OGT at 20 ml/hr with Prostat 30 ml daily and increase by 10 ml to goal rate of 45 ml/hr  Tube feeding regimen provides 1396 kcal (102% of estimated energy needs), 96 grams of protein, 876 ml of free water  NUTRITION DIAGNOSIS: Inadequate oral intake related to inability to eat as evidenced by NPO status.   Goal: Pt to meet >/= 90% of estimated energy needs  Monitor:  TF tolerance/adequacy, vent status, labs   Reason for Assessment: Consult for enteral/TF initiation and management  81 y.o. female  Admitting Dx: <principal problem not specified>  ASSESSMENT: 81 y/o female from [facility] confusion and cough. In ED, she was found to have right sided PNA, febrile 103. Initially placed on BiPAP; however, she remained hypoxic and was intubated.   Labs- low Ca, CO2; high creatinine, BUN, Cl Pt is currently intubated on ventilator support.  OGT in place. Max Temperature: 37.7 Minute Ventilation: 11.0  Propofol: off  Nutrition Focused Physical Exam:  Subcutaneous Fat:  Orbital Region: severe depletion Upper Arm Region: severe depletion Thoracic and Lumbar Region: n/a  Muscle:  Temple Region: severe depletion Clavicle Bone Region: severe depletion Clavicle and Acromion Bone Region: severe depletion Scapular Bone Region: n/a Dorsal Hand: moderate depletion Patellar Region: moderate depletion Anterior Thigh Region: moderate depletion Posterior Calf Region: moderate depletion   Edema: not present  Height: Ht Readings from Last 1 Encounters:  06/10/14 5\' 2"  (1.575 m)    Weight: Wt Readings from Last 1 Encounters:   06/11/14 128 lb 4.9 oz (58.2 kg)    Ideal Body Weight: 110 lb (50 kg)  % Ideal Body Weight: 116%  Wt Readings from Last 10 Encounters:  06/11/14 128 lb 4.9 oz (58.2 kg)  01/10/14 108 lb 15.2 oz (49.42 kg)    Usual Body Weight: unknown  % Usual Body Weight: -  BMI:  Body mass index is 23.46 kg/(m^2).  Estimated Nutritional Needs: Kcal: 1363 Protein: 85-100 grams Fluid: >/=1.5L daily  Skin: intact  Diet Order: Diet NPO time specified  EDUCATION NEEDS: -No education needs identified at this time   Intake/Output Summary (Last 24 hours) at 06/11/14 0938 Last data filed at 06/11/14 0900  Gross per 24 hour  Intake 2982.13 ml  Output    620 ml  Net 2362.13 ml    Last BM: pta  Labs:   Recent Labs Lab 06/10/14 1540 06/11/14 0510  NA 141 140  K 3.0* 4.2  CL 110 113*  CO2 19 18*  BUN 26* 33*  CREATININE 1.17* 1.45*  CALCIUM 8.7 7.4*  MG  --  1.6  PHOS  --  3.5  GLUCOSE 83 107*    CBG (last 3)   Recent Labs  06/10/14 2323 06/11/14 0822 06/11/14 0849  GLUCAP 92 65* 97    Scheduled Meds: . antiseptic oral rinse  7 mL Mouth Rinse QID  . aspirin  325 mg Per Tube Daily  . cefTRIAXone (ROCEPHIN)  IV  1 g Intravenous Q24H  . chlorhexidine  15 mL Mouth Rinse BID  . feeding supplement (VITAL HIGH PROTEIN)  1,000 mL Per Tube Q24H  . heparin  5,000 Units Subcutaneous 3  times per day  . ipratropium-albuterol  3 mL Nebulization Q4H  . levofloxacin (LEVAQUIN) IV  750 mg Intravenous Q48H  . oseltamivir  30 mg Per Tube QHS  . pantoprazole (PROTONIX) IV  40 mg Intravenous QHS  . sodium chloride  500 mL Intravenous Once  . vancomycin  500 mg Intravenous Q24H    Continuous Infusions: . sodium chloride 75 mL/hr at 06/10/14 2100  . norepinephrine (LEVOPHED) Adult infusion Stopped (06/11/14 1610)    Past Medical History  Diagnosis Date  . Hypertension   . CHF (congestive heart failure)   . Hyperlipidemia   . Heart murmur   . Shortness of breath   . GERD  (gastroesophageal reflux disease)     Past Surgical History  Procedure Laterality Date  . Leg surgery      Cristela Felt, MS Dietetic Intern Pager: [phone]

## 2014-06-11 NOTE — Progress Notes (Signed)
Hypoglycemic Event  CBG: 65  Treatment: D50 IV 25 mL  Symptoms: None  Follow-up CBG: Time:0845 CBG Result: 97  Possible Reasons for Event: Inadequate meal intake  Comments/MD notified: MD notified during rounds.   Shiflett, Angelina SheriffJamie M  Remember to initiate Hypoglycemia Order Set & complete

## 2014-06-11 NOTE — Progress Notes (Signed)
Admit Complaint: 81 y.o. F from home [facility] for worsening confusion, cough. In ED, found to have right sided PNA, febrile to 103. Initially placed on BiPAP; however, remained hypoxic despite PEEP 10. Decision made to intubate for short term support  Cardiovascular: starting levophed  Endocrinology: no Hx; CBG wnl  Gastrointestinal / Nutrition: tube feed-goal 5345ml/hr  Neurology: PRN fent,   Nephrology: K low, repleting  Pulmonary: intubated 3/9  Hematology / Oncology: WBC low  PTA Medication Issues: increased ASA dose; BP meds (lisinopril, Lasix, carvedilol, nifedipine), statin, supplements held  Best Practices: PPI, Hep SQ, MC

## 2014-06-11 NOTE — Care Management Note (Signed)
CARE MANAGEMENT NOTE 06/11/2014  Patient:  Creed CopperJACKSON,Shonette G   Account Number:  [id number]  Date Initiated:  06/11/2014  Documentation initiated by:  Jayren Cease  Subjective/Objective Assessment:   resp distress with o2 sats down to 70% on r.a. started on bipap     Action/Plan:   home when stable   Anticipated DC Date:  06/14/2014   Anticipated DC Plan:  HOME/SELF CARE  In-house referral  NA      DC Planning Services  CM consult      Choice offered to / List presented to:             Status of service:  In process, will continue to follow Medicare Important Message given?   (If response is "NO", the following Medicare IM given date fields will be blank) Date Medicare IM given:   Medicare IM given by:   Date Additional Medicare IM given:   Additional Medicare IM given by:    Discharge Disposition:    Per UR Regulation:  Reviewed for med. necessity/level of care/duration of stay  If discussed at Long Length of Stay Meetings, dates discussed:    Comments:  June 11, 2014/Viona Hosking L. Earlene Plateravis, RN, BSN, CCM. Case Management [employer] [phone] No discharge needs present of time of review.

## 2014-06-11 NOTE — Plan of Care (Signed)
Problem: Phase I Progression Outcomes Goal: VTE prophylaxis Outcome: Completed/Met Date Met:  06/11/14 Heparin inj

## 2014-06-11 NOTE — Progress Notes (Signed)
Hypoglycemic Event  CBG: 62   Treatment: D50 Symptoms: asymptomatic   Follow-up CBG: Time:2315 CBG Result:148  Possible Reasons for Event: high residuals with tube feeding  Comments/MD notified    Max SaneBarham, Lycia Sachdeva B  Remember to initiate Hypoglycemia Order Set & complete

## 2014-06-11 NOTE — Progress Notes (Signed)
Hypoglycemic Event  CBG: 67  Treatment: D50 IV 25 mL  Symptoms: None  Follow-up CBG: Time: 1245 CBG Result: 92  Possible Reasons for Event: Inadequate meal intake  Comments/MD notified:Tube feeding is being started today.   Shiflett, Angelina SheriffJamie M

## 2014-06-12 ENCOUNTER — Inpatient Hospital Stay (HOSPITAL_COMMUNITY): Payer: Commercial Managed Care - HMO

## 2014-06-12 DIAGNOSIS — I4891 Unspecified atrial fibrillation: Secondary | ICD-10-CM

## 2014-06-12 DIAGNOSIS — E43 Unspecified severe protein-calorie malnutrition: Secondary | ICD-10-CM | POA: Diagnosis present

## 2014-06-12 DIAGNOSIS — J9601 Acute respiratory failure with hypoxia: Secondary | ICD-10-CM

## 2014-06-12 DIAGNOSIS — A400 Sepsis due to streptococcus, group A: Principal | ICD-10-CM

## 2014-06-12 LAB — URINE CULTURE
COLONY COUNT: NO GROWTH
Culture: NO GROWTH

## 2014-06-12 LAB — CBC WITH DIFFERENTIAL/PLATELET
BASOS ABS: 0 10*3/uL (ref 0.0–0.1)
Basophils Relative: 0 % (ref 0–1)
Eosinophils Absolute: 0 10*3/uL (ref 0.0–0.7)
Eosinophils Relative: 0 % (ref 0–5)
HEMATOCRIT: 35.4 % — AB (ref 36.0–46.0)
Hemoglobin: 11.8 g/dL — ABNORMAL LOW (ref 12.0–15.0)
LYMPHS ABS: 0.6 10*3/uL — AB (ref 0.7–4.0)
Lymphocytes Relative: 8 % — ABNORMAL LOW (ref 12–46)
MCH: 34.4 pg — AB (ref 26.0–34.0)
MCHC: 33.3 g/dL (ref 30.0–36.0)
MCV: 103.2 fL — AB (ref 78.0–100.0)
MONO ABS: 0.1 10*3/uL (ref 0.1–1.0)
MONOS PCT: 1 % — AB (ref 3–12)
NEUTROS ABS: 7.2 10*3/uL (ref 1.7–7.7)
Neutrophils Relative %: 91 % — ABNORMAL HIGH (ref 43–77)
PLATELETS: 193 10*3/uL (ref 150–400)
RBC: 3.43 MIL/uL — ABNORMAL LOW (ref 3.87–5.11)
RDW: 14.4 % (ref 11.5–15.5)
WBC: 7.9 10*3/uL (ref 4.0–10.5)

## 2014-06-12 LAB — CULTURE, BLOOD (ROUTINE X 2)

## 2014-06-12 LAB — LEGIONELLA ANTIGEN, URINE

## 2014-06-12 LAB — GLUCOSE, CAPILLARY
GLUCOSE-CAPILLARY: 69 mg/dL — AB (ref 70–99)
GLUCOSE-CAPILLARY: 131 mg/dL — AB (ref 70–99)
Glucose-Capillary: 103 mg/dL — ABNORMAL HIGH (ref 70–99)
Glucose-Capillary: 78 mg/dL (ref 70–99)
Glucose-Capillary: 95 mg/dL (ref 70–99)

## 2014-06-12 LAB — BASIC METABOLIC PANEL
Anion gap: 8 (ref 5–15)
BUN: 37 mg/dL — AB (ref 6–23)
CHLORIDE: 116 mmol/L — AB (ref 96–112)
CO2: 18 mmol/L — ABNORMAL LOW (ref 19–32)
Calcium: 7.6 mg/dL — ABNORMAL LOW (ref 8.4–10.5)
Creatinine, Ser: 1.4 mg/dL — ABNORMAL HIGH (ref 0.50–1.10)
GFR calc non Af Amer: 33 mL/min — ABNORMAL LOW (ref >90)
GFR, EST AFRICAN AMERICAN: 38 mL/min — AB (ref >90)
Glucose, Bld: 70 mg/dL (ref 70–99)
Potassium: 4.2 mmol/L (ref 3.5–5.1)
SODIUM: 142 mmol/L (ref 135–145)

## 2014-06-12 LAB — CULTURE, RESPIRATORY W GRAM STAIN

## 2014-06-12 LAB — CULTURE, RESPIRATORY

## 2014-06-12 LAB — PROCALCITONIN: Procalcitonin: >175 ng/mL

## 2014-06-12 LAB — STREP PNEUMONIAE URINARY ANTIGEN: STREP PNEUMO URINARY ANTIGEN: NEGATIVE

## 2014-06-12 MED ORDER — AMIODARONE HCL IN DEXTROSE 360-4.14 MG/200ML-% IV SOLN
60.0000 mg/h | INTRAVENOUS | Status: AC
  Administered 2014-06-12: 60 mg/h via INTRAVENOUS

## 2014-06-12 MED ORDER — ENOXAPARIN SODIUM 30 MG/0.3ML ~~LOC~~ SOLN
30.0000 mg | SUBCUTANEOUS | Status: DC
  Administered 2014-06-12 – 2014-06-17 (×6): 30 mg via SUBCUTANEOUS
  Filled 2014-06-12 (×8): qty 0.3

## 2014-06-12 MED ORDER — DEXTROSE 50 % IV SOLN
25.0000 mL | Freq: Once | INTRAVENOUS | Status: AC
  Administered 2014-06-12: 25 mL via INTRAVENOUS

## 2014-06-12 MED ORDER — DEXTROSE 5 % IV SOLN
INTRAVENOUS | Status: DC
  Administered 2014-06-12: 10:00:00 via INTRAVENOUS

## 2014-06-12 MED ORDER — CEFAZOLIN SODIUM 1-5 GM-% IV SOLN
1.0000 g | Freq: Two times a day (BID) | INTRAVENOUS | Status: DC
  Administered 2014-06-13 – 2014-06-14 (×4): 1 g via INTRAVENOUS
  Filled 2014-06-12 (×7): qty 50

## 2014-06-12 MED ORDER — AMIODARONE HCL IN DEXTROSE 360-4.14 MG/200ML-% IV SOLN
30.0000 mg/h | INTRAVENOUS | Status: DC
  Administered 2014-06-12 – 2014-06-13 (×2): 30 mg/h via INTRAVENOUS
  Filled 2014-06-12 (×3): qty 200

## 2014-06-12 MED ORDER — METOPROLOL TARTRATE 1 MG/ML IV SOLN
5.0000 mg | INTRAVENOUS | Status: AC | PRN
  Administered 2014-06-12 (×2): 5 mg via INTRAVENOUS
  Filled 2014-06-12: qty 5

## 2014-06-12 MED ORDER — IPRATROPIUM-ALBUTEROL 0.5-2.5 (3) MG/3ML IN SOLN
3.0000 mL | RESPIRATORY_TRACT | Status: DC | PRN
  Administered 2014-06-12: 3 mL via RESPIRATORY_TRACT
  Filled 2014-06-12: qty 3

## 2014-06-12 MED ORDER — FUROSEMIDE 10 MG/ML IJ SOLN
INTRAMUSCULAR | Status: AC
  Filled 2014-06-12: qty 4

## 2014-06-12 MED ORDER — FUROSEMIDE 10 MG/ML IJ SOLN
40.0000 mg | Freq: Once | INTRAMUSCULAR | Status: AC
  Administered 2014-06-12: 40 mg via INTRAVENOUS

## 2014-06-12 MED ORDER — AMIODARONE IV BOLUS ONLY 150 MG/100ML
INTRAVENOUS | Status: AC
  Filled 2014-06-12: qty 100

## 2014-06-12 MED ORDER — DEXTROSE-NACL 5-0.45 % IV SOLN
INTRAVENOUS | Status: DC
  Administered 2014-06-12: 11:00:00 via INTRAVENOUS

## 2014-06-12 MED ORDER — AMIODARONE LOAD VIA INFUSION
150.0000 mg | Freq: Once | INTRAVENOUS | Status: AC
  Administered 2014-06-12: 150 mg via INTRAVENOUS
  Filled 2014-06-12: qty 83.34

## 2014-06-12 MED ORDER — METOPROLOL TARTRATE 1 MG/ML IV SOLN
INTRAVENOUS | Status: AC
  Filled 2014-06-12: qty 5

## 2014-06-12 MED ORDER — AMIODARONE HCL IN DEXTROSE 360-4.14 MG/200ML-% IV SOLN
INTRAVENOUS | Status: AC
  Filled 2014-06-12: qty 200

## 2014-06-12 NOTE — Progress Notes (Signed)
Hypoglycemic Event  CBG: 69  Treatment: 25ml D50  Symptoms: asymptomatic  Follow-up CBG: Time:0430 CBG Result:103  Possible Reasons for Event: high tube feed residual   Comments/MD notified:   Gaylyn RongBarham, Deloy Archey B  Remember to initiate Hypoglycemia Order Set & complete

## 2014-06-12 NOTE — Progress Notes (Signed)
PULMONARY / CRITICAL CARE MEDICINE   Name: Traci Hale MRN: [id number] DOB: [birth year]    ADMISSION DATE:  06/10/2014 CONSULTATION DATE:  06/12/2014  REFERRING MD :  Adela Glimpseoutova  CHIEF COMPLAINT:  Confusion, cough  INITIAL PRESENTATION:  81 y.o. F from home [facility] for worsening confusion, cough.  In ED, found to have right sided PNA, febrile to 103.  Initially placed on BiPAP; however, remained hypoxic despite PEEP 10.  Decision made to intubate for short term support.  Code status DNR.   STUDIES:  CXR 3/9 >>> diffuse R sided lung disease, ? Patchy LUL opacity CT head/c-spine 3/9 >>> no acute findings R Knee 3/9 >>> no acute findings R hip 3/9 >>> no acute findings  SIGNIFICANT EVENTS: 3/9 - admit, intubated 3/10 passed sbt but in AF w/ RVR. Loaded w/ amio  SUBJECTIVE:  Off pressors, passed SBT. But now in AF  VITAL SIGNS: Temp:  [99.5 F (37.5 C)-101.5 F (38.6 C)] 100.8 F (38.2 C) (03/11 1000) Pulse Rate:  [47-143] 108 (03/11 1000) Resp:  [16-35] 22 (03/11 1000) BP: (88-149)/(51-77) 100/53 mmHg (03/11 1000) SpO2:  [98 %-100 %] 100 % (03/11 1000) FiO2 (%):  [40 %-60 %] 40 % (03/11 1000) Weight:  [58.6 kg (129 lb 3 oz)] 58.6 kg (129 lb 3 oz) (03/11 0400) HEMODYNAMICS: CVP:  [3 mmHg-8 mmHg] 7 mmHg VENTILATOR SETTINGS: Vent Mode:  [-] PRVC FiO2 (%):  [40 %-60 %] 40 % Set Rate:  [16 bmp] 16 bmp Vt Set:  [400 mL] 400 mL PEEP:  [5 cmH20] 5 cmH20 Plateau Pressure:  [12 cmH20-15 cmH20] 15 cmH20 INTAKE / OUTPUT: Intake/Output      03/10 0701 - 03/11 0700 03/11 0701 - 03/12 0700   I.V. (mL/kg) 2040 (34.8) 170 (2.9)   NG/GT 312.3 120   IV Piggyback 600    Total Intake(mL/kg) 2952.3 (50.4) 290 (4.9)   Urine (mL/kg/hr) 650 (0.5) 50 (0.2)   Emesis/NG output 0 (0)    Total Output 650 50   Net +2302.3 +240          PHYSICAL EXAMINATION: Gen: frail female on vent HEENT: NCAT EOMi, ETT in place PULM: Wheezing L > R, crackles R base, no accessory muscle use  on SBT CV: Tachy, irreg irreg  AB: BS+, soft, nontender Ext: cool, no edema Neuro: Awake on vent, follows commands moves all four ext  LABS:  CBC  Recent Labs Lab 06/10/14 1540 06/11/14 0510 06/12/14 0400  WBC 1.6* 1.8* 7.9  HGB 14.2 11.6* 11.8*  HCT 42.8 35.4* 35.4*  PLT 210 183 193   Coag's No results for input(s): APTT, INR in the last 168 hours. BMET  Recent Labs Lab 06/10/14 1540 06/11/14 0510 06/12/14 0400  NA 141 140 142  K 3.0* 4.2 4.2  CL 110 113* 116*  CO2 19 18* 18*  BUN 26* 33* 37*  CREATININE 1.17* 1.45* 1.40*  GLUCOSE 83 107* 70   Electrolytes  Recent Labs Lab 06/10/14 1540 06/11/14 0510 06/12/14 0400  CALCIUM 8.7 7.4* 7.6*  MG  --  1.6  --   PHOS  --  3.5  --    Sepsis Markers  Recent Labs Lab 06/10/14 1539 06/10/14 2220 06/11/14 0510 06/12/14 0400  LATICACIDVEN 2.47* 2.4*  --   --   PROCALCITON  --  >175.00 >175.00 >175.00   ABG  Recent Labs Lab 06/10/14 1815 06/10/14 2205  PHART 7.373 7.342*  PCO2ART 32.3* 32.0*  PO2ART 195.0* 105.0*  Liver Enzymes  Recent Labs Lab 06/10/14 1540 06/11/14 0510  AST 57* 71*  ALT 16 15  ALKPHOS 45 27*  BILITOT 1.3* 0.8  ALBUMIN 3.6 2.5*   Cardiac Enzymes No results for input(s): TROPONINI, PROBNP in the last 168 hours. Glucose  Recent Labs Lab 06/11/14 1916 06/11/14 2301 06/11/14 2320 06/12/14 0402 06/12/14 0440 06/12/14 0813  GLUCAP 72 63* 148* 69* 103* 78    Imaging Right sided airspace disease. Aeration improved.   ASSESSMENT / PLAN:  PULMONARY OETT 3/9 >>> 3/11 A: Acute hypoxic respiratory failure likely due to Severe CAP +/- influenza Wheezing and air-trapping 3/10, no history of asthma or emphysema PAH by echo (PAP 54) Aeration improved. Passed SBT P:   Continue full mechanical vent support Extubated if we can get rate controled  VAP bundle SBT/WUA daily Daily CXR  CARDIOVASCULAR CVL L IJ 3/9 >>> A:  Septic and cardiogenic shock due to CAP >  resolved Elevated BNP (17819) Hx HTN, HLD, systolic CHF (last echo Oct 2015 with EF 35-40%, mild LVH, PAP 54) AFRVR 3/11 DNR Status P:  Keep euvolemic Amiodarone load and gtt 3/11 Tele If still in AF 3/12 will need anticoagulation   RENAL A:   Hypokalemia - resolved AKI > oliguric, worrisome for ATN-> improved  Mild NAG metabolic acidosis  P:   Cont IVFs, but change to 1/2 NS  Serial chemistries   GASTROINTESTINAL A:   GERD Nutrition needs P:   SUP: Pantoprazole NPO Start tube feedings 3/10  HEMATOLOGIC A:   Leukopenia> left shift on smear, likely due to severe CAP/sepsis VTE Prophylaxis P:  SCD's / Heparin CBC in AM  INFECTIOUS A:   Sepsis due to Severe CAP (gp A strep) Group A strep bacteremia bacteremia  P:   BCx2 3/9 >GPC chains>>> UCx 3/9 >neg  Sputum Cx 3/9 > gp A strep  Respiratory viral panel 3/9 > Urine strep 3/10 >neg  Urine leg 3/10 >neg   Abx: Vanc, start date 3/9, day 2/x-->d/c 3/11 Abx: Levaquin, start date 3/9, day 2/x-->d/c 3/11 Abx: Ceftriaxone, start date 3/9, dc 3/11 Avx: Tamiflu 3/9 >3/11 Cefazolin 3/11 >>     ENDOCRINE A:   No known issues P:   SSI if glucose consistently > 180  NEUROLOGIC A:   Acute metabolic encephalopathy Mild dementia P:   Sedation:  Fentanyl PRN RASS goal: 0 to -1 Daily WUA   Family updated: Daughter at bedside 3/10  Interdisciplinary Family Meeting v Palliative Care Meeting:  Due by: 3/15.   Simonne Martinet ACNP-BC [facility] Pulmonary/Critical Care Pager # [phone] OR # [phone] if no answer    PCCM ATTENDING: I have reviewed pt's initial presentation, consultants notes and hospital database in detail.  The above assessment and plan was formulated under my direction.  In summary: Elderly woman with CAP due to group a strep pneumonia c/b bacteremia. Septic shock resolved. AFRVR this AM > converted to NSR after amiodarone initiated. She passed SBT and is now extubated. She is tolerating  presently. Her cough is a little meager and attention needs to be paid to airway hygiene. I have narrowed her abx to cefazolin based on culture results  She has improved substantially since admission and she was highly functional for her advanced age according to her family. Therefore, I suggested that if she fails extubation, we should re-intubate, but short term only. She remains DNR in event of cardiac arrest.   35 minutes of independent CCM time was provided by me  Billy Fischer, MD;  PCCM service; Mobile [phone]  06/12/2014 10:41 AM

## 2014-06-12 NOTE — Progress Notes (Signed)
NUTRITION FOLLOW UP  Pt meets criteria for severe malnutrition in the context of chronic illness as evidenced by severe muscle mass and subcutaneous body fat depletion.  Intervention:   - Continue Vital AF 1.2 @ 45 mL/hr.   Tube feeding regimen provides 1396 kcal (102% of estimated energy needs), 96 grams of protein, 876 ml of free water  - RD will continue to monitor  Nutrition Dx:   Inadequate oral intake related to inability to eat as evidenced by NPO statu; ongoing   Goal:   Pt to meet >/= 90% of their estimated nutrition needs; not met  Monitor:   TF tolerance/adequacy, vent status, labs  Assessment:   81 y/o female from home brought to [facility] ED for worsening confusion and cough. In ED, she was found to have right sided PNA, febrile 103. Initially placed on BiPAP; however, she remained hypoxic and was intubated.   Patient is currently intubated on ventilator support MV: 9.8 L/min Temp (24hrs), Avg:100.3 F (37.9 C), Min:99.5 F (37.5 C), Max:101.5 F (38.6 C)  Propofol: none  - Per RN, pt with residuals of ~250 mL. Pt with several hypoglycemic episodes. Started on D5.  - Will continue to monitor residuals and TF tolerance.   Height: Ht Readings from Last 1 Encounters:  06/10/14 $RemoveB'5\' 2"'[id number]$  (1.575 m)    Weight Status:   Wt Readings from Last 1 Encounters:  06/12/14 129 lb 3 oz (58.6 kg)    Re-estimated needs:  Kcal: 1474 Protein: 85-100 g Fluid: 1.5 L/day  Skin: intact  Diet Order:     Intake/Output Summary (Last 24 hours) at 06/12/14 1202 Last data filed at 06/12/14 1129  Gross per 24 hour  Intake 2568.55 ml  Output    615 ml  Net 1953.55 ml    Last BM: prior to admission   Labs:   Recent Labs Lab 06/10/14 1540 06/11/14 0510 06/12/14 0400  NA 141 140 142  K 3.0* 4.2 4.2  CL 110 113* 116*  CO2 19 18* 18*  BUN 26* 33* 37*  CREATININE 1.17* 1.45* 1.40*  CALCIUM 8.7 7.4* 7.6*  MG  --  1.6  --   PHOS  --  3.5  --   GLUCOSE 83 107* 70    CBG  (last 3)   Recent Labs  06/12/14 0402 06/12/14 0440 06/12/14 0813  GLUCAP 69* 103* 78    Scheduled Meds: . antiseptic oral rinse  7 mL Mouth Rinse QID  . aspirin  325 mg Per Tube Daily  . cefTRIAXone (ROCEPHIN)  IV  2 g Intravenous Q24H  . chlorhexidine  15 mL Mouth Rinse BID  . feeding supplement (PRO-STAT SUGAR FREE 64)  30 mL Per Tube Daily  . heparin  5,000 Units Subcutaneous 3 times per day  . pantoprazole (PROTONIX) IV  40 mg Intravenous QHS    Continuous Infusions: . amiodarone 60 mg/hr (06/12/14 1105)   Followed by  . amiodarone    . dextrose 5 % and 0.45% NaCl 75 mL/hr at 06/12/14 1105  . feeding supplement (VITAL AF 1.2 CAL) 1,000 mL (06/11/14 2000)    Laurette Schimke [place], [place], [place]

## 2014-06-12 NOTE — Progress Notes (Addendum)
ANTIBIOTIC CONSULT NOTE - follow-up  Pharmacy Consult for cefazolin Indication: Group A streptococcus bacteremia  No Known Allergies  Patient Measurements: Height: 5\' 2"  (157.5 cm) Weight: 129 lb 3 oz (58.6 kg) IBW/kg (Calculated) : 50.1 Adjusted Body Weight: n/a  Vital Signs: Temp: 99.5 F (37.5 C) (03/11 1500) Temp Source: Core (Comment) (03/11 1500) BP: 154/74 mmHg (03/11 1500) Pulse Rate: 85 (03/11 1500) Intake/Output from previous day: 03/10 0701 - 03/11 0700 In: 2952.3 [I.V.:2040; NG/GT:312.3; IV Piggyback:600] Out: 650 [Urine:650] Intake/Output from this shift: Total I/O In: 1192.5 [I.V.:857.5; NG/GT:285; IV Piggyback:50] Out: 255 [Urine:255]  Labs:  Recent Labs  06/10/14 1540 06/11/14 0510 06/12/14 0400  WBC 1.6* 1.8* 7.9  HGB 14.2 11.6* 11.8*  PLT 210 183 193  CREATININE 1.17* 1.45* 1.40*   Estimated Creatinine Clearance: 22.4 mL/min (by C-G formula based on Cr of 1.4). No results for input(s): VANCOTROUGH, VANCOPEAK, VANCORANDOM, GENTTROUGH, GENTPEAK, GENTRANDOM, TOBRATROUGH, TOBRAPEAK, TOBRARND, AMIKACINPEAK, AMIKACINTROU, AMIKACIN in the last 72 hours.   Microbiology: Recent Results (from the past 720 hour(s))  Culture, blood (routine x 2)     Status: None (Preliminary result)   Collection Time: 06/10/14  3:40 PM  Result Value Ref Range Status   Specimen Description BLOOD L FOREARM  Final   Special Requests BOTTLES DRAWN AEROBIC AND ANAEROBIC 2.5ML  Final   Culture   Final           BLOOD CULTURE RECEIVED NO GROWTH TO DATE CULTURE WILL BE HELD FOR 5 DAYS BEFORE ISSUING A FINAL NEGATIVE REPORT Performed at [facility]    Report Status PENDING  Incomplete  Culture, blood (routine x 2)     Status: None   Collection Time: 06/10/14  3:50 PM  Result Value Ref Range Status   Specimen Description BLOOD R FOREARM  Final   Special Requests BOTTLES DRAWN AEROBIC AND ANAEROBIC 5 ML  Final   Culture   Final    GROUP A STREP (S.PYOGENES)  ISOLATED Note: CRITICAL RESULT CALLED TO, READ BACK BY AND VERIFIED WITH: MEGAN SHIFLETT @ 1059 ON [id number] BY [facility] FAXED TO [place] ATTN CONNIE WEANT [id number] BY MARHE Note: Gram Stain Report Called to,Read Back By and Verified With: MEGAN SCHOETT 06/11/14 0950 BY SMITHERSJ Performed at [facility]    Report Status 06/12/2014 FINAL  Final  Urine culture     Status: None   Collection Time: 06/10/14  4:44 PM  Result Value Ref Range Status   Specimen Description URINE, CATHETERIZED  Final   Special Requests NONE  Final   Colony Count NO GROWTH Performed at [facility]   Final   Culture NO GROWTH Performed at [facility]   Final   Report Status 06/12/2014 FINAL  Final  Culture, respiratory (NON-Expectorated)     Status: None   Collection Time: 06/10/14 10:00 PM  Result Value Ref Range Status   Specimen Description TRACHEAL ASPIRATE  Final   Special Requests NONE  Final   Gram Stain   Final    RARE WBC PRESENT, PREDOMINANTLY MONONUCLEAR NO SQUAMOUS EPITHELIAL CELLS SEEN NO ORGANISMS SEEN Performed at [facility]    Culture   Final    FEW GROUP A STREP (S.PYOGENES) ISOLATED Performed at [facility]    Report Status 06/12/2014 FINAL  Final  MRSA PCR Screening     Status: None   Collection Time: 06/10/14 10:44 PM  Result Value Ref Range Status   MRSA by PCR NEGATIVE NEGATIVE  Final    Comment:        The GeneXpert MRSA Assay (FDA approved for NASAL specimens only), is one component of a comprehensive MRSA colonization surveillance program. It is not intended to diagnose MRSA infection nor to guide or monitor treatment for MRSA infections. Performed at [place] History: Past Medical History  Diagnosis Date  . Hypertension   . CHF (congestive heart failure)   . Hyperlipidemia   . Heart murmur   . Shortness of breath   . GERD (gastroesophageal reflux disease)     Medications:  Scheduled:  .  aspirin  325 mg Per Tube Daily  . enoxaparin (LOVENOX) injection  30 mg Subcutaneous Q24H   Infusions:  . amiodarone 30 mg/hr (06/12/14 1535)   Followed by  . amiodarone 30 mg/hr (06/12/14 1530)  . dextrose 5 % and 0.45% NaCl 75 mL/hr at 06/12/14 1500    Assessment: 89yoF presenting with AMS and fever.  Endorses cough for last few days, no vomiting.  Pharmacy to dose vancomycin and Zosyn for sepsis.  PMH CHF, HTN Patient met criteria for sepsis with fever, tachypnea, and WBC < 4k.  Code sepsis called at 1600; first doses of antibiotics given in ED  89yoF presenting with AMS and fever. Endorses cough for last few days, no vomiting. Pharmacy to dose vancomycin and Zosyn for sepsis. PMH CHF, HTN Patient met criteria for sepsis with fever, tachypnea, and WBC < 4k. Code sepsis called at 1600; first doses of antibiotics given in ED  3/9 >> ceftriaxone >> 3/11 3/9 >> vancomycin >> 3/11 3/9 >> Zosyn >> 3/9 3/9 >> levaquin >> 3/11 3/9 >> Tamiflu (dose reduced for renal dysfx) >> 3/11 3/11 >> cefazolin >>  Temp: 103.5 on admit -> 100.8 this am WBC: 1.6 on admit . 7.9 today Renal: SCr rising 1.4, Cl 22 CG LA 2.47 PCT: > 175, > 175  3/9 blood: 1 of 2 group A strep 3/9 urine: NGF (UA w/ neg leukocytes but many bacteria) 3/9 strep/legionella UAg: neg/pending 3/9 trach asp: few group A strep 3/10 Resp virus panel: sent  Goal of Therapy:   Eradication of infection  Appropriate antibiotic dosing for indication and renal function  Plan:   Cefazolin 1gm IV q12h based on current renal function, starting 3/12 am (24h from last ceftriaxone dose)  Follow clinical course, culture results as available, renal function  Doreene Eland, PharmD, BCPS.   Pager: [phone] 06/12/2014, 3:43 PM

## 2014-06-12 NOTE — Progress Notes (Signed)
    Attention: [employer]   RE: [name]  This letter is to make you aware that we are currently caring for [name], [facility]. She was admitted on 06/10/2014  2:56 PM. She has been under our care since that time. Please excuse any absences from work that [name] may have had during this time. At this point it is unclear how long her mothers recovery will take as she is critically ill. I anticipate she will remain hospitalized for minimally 5-7 more days.   Thank you for your support  Simonne Martineteter E Ki Corbo ACNP-BC [facility] Pulmonary/Critical Care

## 2014-06-12 NOTE — Progress Notes (Signed)
eLink Physician-Brief Progress Note Patient Name: Traci CopperOphelia G Hale DOB: 1924/11/17 MRN: [id number]   Date of Service  06/12/2014  HPI/Events of Note  Developed A fib w RVR 140's  eICU Interventions  Metoprolol ordered, will follow     Intervention Category Major Interventions: Arrhythmia - evaluation and management  Tynisha Ogan S. 06/12/2014, 6:49 AM

## 2014-06-12 NOTE — Plan of Care (Signed)
Problem: Phase II Progression Outcomes Goal: Date pt extubated/weaned off vent Outcome: Completed/Met Date Met:  06/12/14 Extubated 06/12/14  Goal: Time pt extubated/weaned off vent Outcome: Completed/Met Date Met:  06/12/14 Patient extubated 1240

## 2014-06-12 NOTE — Progress Notes (Signed)
eLink Physician-Brief Progress Note Patient Name: Traci CopperOphelia G Wahba DOB: Sep 12, 1924 MRN: [id number]   Date of Service  06/12/2014  HPI/Events of Note  Call from nurse reporting that patient who was extubated today now with abrupt onset of SOB and tachypena.  Currently receiving breathing rx.  Camera check shows patient with neb sats of 97%, HD stable with RR in the 30s.  RT reports coarse breath sounds bilaterally.  PCXR this AM shows some clearing of PNA.  Is in positive balance over 2 days.  eICU Interventions  Plan: Continue with neb Lasix 40 mg IV times one dose Continue to monitor     Intervention Category Intermediate Interventions: Respiratory distress - evaluation and management  DETERDING,ELIZABETH 06/12/2014, 11:27 PM

## 2014-06-13 ENCOUNTER — Inpatient Hospital Stay (HOSPITAL_COMMUNITY): Payer: Commercial Managed Care - HMO

## 2014-06-13 LAB — RESPIRATORY VIRUS PANEL
Adenovirus: NEGATIVE
Influenza A: POSITIVE — AB
Influenza B: NEGATIVE
Metapneumovirus: NEGATIVE
PARAINFLUENZA 1 A: NEGATIVE
PARAINFLUENZA 2 A: NEGATIVE
Parainfluenza 3: NEGATIVE
RHINOVIRUS: NEGATIVE
Respiratory Syncytial Virus A: NEGATIVE
Respiratory Syncytial Virus B: NEGATIVE

## 2014-06-13 LAB — COMPREHENSIVE METABOLIC PANEL
ALBUMIN: 2.2 g/dL — AB (ref 3.5–5.2)
ALT: 18 U/L (ref 0–35)
AST: 47 U/L — ABNORMAL HIGH (ref 0–37)
Alkaline Phosphatase: 49 U/L (ref 39–117)
Anion gap: 10 (ref 5–15)
BUN: 32 mg/dL — ABNORMAL HIGH (ref 6–23)
CO2: 20 mmol/L (ref 19–32)
Calcium: 8.2 mg/dL — ABNORMAL LOW (ref 8.4–10.5)
Chloride: 114 mmol/L — ABNORMAL HIGH (ref 96–112)
Creatinine, Ser: 1.14 mg/dL — ABNORMAL HIGH (ref 0.50–1.10)
GFR calc Af Amer: 49 mL/min — ABNORMAL LOW (ref >90)
GFR calc non Af Amer: 42 mL/min — ABNORMAL LOW (ref >90)
GLUCOSE: 79 mg/dL (ref 70–99)
Potassium: 3 mmol/L — ABNORMAL LOW (ref 3.5–5.1)
SODIUM: 144 mmol/L (ref 135–145)
TOTAL PROTEIN: 6 g/dL (ref 6.0–8.3)
Total Bilirubin: 0.6 mg/dL (ref 0.3–1.2)

## 2014-06-13 LAB — CBC
HEMATOCRIT: 26.1 % — AB (ref 36.0–46.0)
HEMOGLOBIN: 9.1 g/dL — AB (ref 12.0–15.0)
MCH: 34.9 pg — AB (ref 26.0–34.0)
MCHC: 34.9 g/dL (ref 30.0–36.0)
MCV: 100 fL (ref 78.0–100.0)
PLATELETS: 173 10*3/uL (ref 150–400)
RBC: 2.61 MIL/uL — AB (ref 3.87–5.11)
RDW: 14.2 % (ref 11.5–15.5)
WBC: 11.5 10*3/uL — ABNORMAL HIGH (ref 4.0–10.5)

## 2014-06-13 LAB — TSH: TSH: 1 u[IU]/mL (ref 0.350–4.500)

## 2014-06-13 LAB — TROPONIN I: TROPONIN I: 0.42 ng/mL — AB (ref <0.031)

## 2014-06-13 MED ORDER — CARVEDILOL 6.25 MG PO TABS
6.2500 mg | ORAL_TABLET | Freq: Two times a day (BID) | ORAL | Status: DC
  Administered 2014-06-13 – 2014-06-14 (×2): 6.25 mg via ORAL
  Filled 2014-06-13 (×5): qty 1

## 2014-06-13 MED ORDER — FUROSEMIDE 20 MG PO TABS
20.0000 mg | ORAL_TABLET | Freq: Every day | ORAL | Status: DC
  Administered 2014-06-13 – 2014-06-18 (×6): 20 mg via ORAL
  Filled 2014-06-13 (×6): qty 1

## 2014-06-13 MED ORDER — SODIUM CHLORIDE 0.9 % IJ SOLN
10.0000 mL | INTRAMUSCULAR | Status: DC | PRN
  Administered 2014-06-13: 10 mL
  Administered 2014-06-14 – 2014-06-15 (×2): 20 mL
  Administered 2014-06-17: 10 mL
  Administered 2014-06-17: 20 mL
  Administered 2014-06-17 – 2014-06-18 (×2): 10 mL
  Filled 2014-06-13 (×7): qty 40

## 2014-06-13 MED ORDER — SODIUM CHLORIDE 0.9 % IJ SOLN
10.0000 mL | Freq: Two times a day (BID) | INTRAMUSCULAR | Status: DC
  Administered 2014-06-15 (×2): 10 mL
  Administered 2014-06-16: 20 mL

## 2014-06-13 MED ORDER — METOPROLOL TARTRATE 1 MG/ML IV SOLN
2.5000 mg | INTRAVENOUS | Status: DC | PRN
  Administered 2014-06-14: 2.5 mg via INTRAVENOUS
  Filled 2014-06-13 (×2): qty 5

## 2014-06-13 MED ORDER — POTASSIUM CHLORIDE 10 MEQ/50ML IV SOLN
10.0000 meq | INTRAVENOUS | Status: AC
  Administered 2014-06-13 (×4): 10 meq via INTRAVENOUS
  Filled 2014-06-13 (×5): qty 50

## 2014-06-13 MED ORDER — AMIODARONE HCL 200 MG PO TABS
200.0000 mg | ORAL_TABLET | Freq: Every day | ORAL | Status: DC
  Administered 2014-06-13 – 2014-06-18 (×6): 200 mg via ORAL
  Filled 2014-06-13 (×6): qty 1

## 2014-06-13 NOTE — Progress Notes (Signed)
PULMONARY / CRITICAL CARE MEDICINE   Name: Traci Hale MRN: [id number] DOB: Mar 03, 1925    ADMISSION DATE:  06/10/2014 CONSULTATION DATE:  06/13/2014  REFERRING MD :  Adela Glimpseoutova  CHIEF COMPLAINT:  Confusion, cough  INITIAL PRESENTATION:  81 y.o. F from home [facility] for worsening confusion, cough.  In ED, found to have right sided PNA, febrile to 103.  Initially placed on BiPAP; however, remained hypoxic despite PEEP 10.  Decision made to intubate for short term support.  Code status DNR.   STUDIES:  CXR 3/9 >>> diffuse R sided lung disease, ? Patchy LUL opacity CT head/c-spine 3/9 >>> no acute findings R Knee 3/9 >>> no acute findings R hip 3/9 >>> no acute findings  SIGNIFICANT EVENTS: 3/9 - admit, intubated 3/10 passed sbt but in AF w/ RVR. Loaded w/ amio  SUBJECTIVE:  Extubated AF-RVR requiring amio gtt C/o feeling 'not good'  VITAL SIGNS: Temp:  [99.3 F (37.4 C)-100.8 F (38.2 C)] 100.2 F (37.9 C) (03/12 1000) Pulse Rate:  [72-91] 86 (03/12 1000) Resp:  [13-37] 22 (03/12 1000) BP: (106-154)/(49-102) 139/55 mmHg (03/12 1000) SpO2:  [95 %-100 %] 97 % (03/12 1000) FiO2 (%):  [40 %] 40 % (03/11 1200) HEMODYNAMICS:   VENTILATOR SETTINGS: Vent Mode:  [-]  FiO2 (%):  [40 %] 40 % INTAKE / OUTPUT: Intake/Output      03/11 0701 - 03/12 0700 03/12 0701 - 03/13 0700   I.V. (mL/kg) 1931.2 (33) 150.1 (2.6)   NG/GT 285    IV Piggyback 50 50   Total Intake(mL/kg) 2266.2 (38.7) 200.1 (3.4)   Urine (mL/kg/hr) 2845 (2) 300 (1.2)   Emesis/NG output     Total Output 2845 300   Net -578.8 -99.9          PHYSICAL EXAMINATION: Gen: frail female  HEENT: NCAT EOMi PULM: Wheezing L > R, crackles R base, no accessory muscle use CV: Tachy, irreg irreg  AB: BS+, soft, nontender Ext: cool, no edema Neuro: Awake , interactive,follows commands moves all four ext  LABS:  CBC  Recent Labs Lab 06/11/14 0510 06/12/14 0400 06/13/14 0525  WBC 1.8* 7.9 11.5*   HGB 11.6* 11.8* 9.1*  HCT 35.4* 35.4* 26.1*  PLT 183 193 173   Coag's No results for input(s): APTT, INR in the last 168 hours. BMET  Recent Labs Lab 06/11/14 0510 06/12/14 0400 06/13/14 0930  NA 140 142 144  K 4.2 4.2 3.0*  CL 113* 116* 114*  CO2 18* 18* 20  BUN 33* 37* 32*  CREATININE 1.45* 1.40* 1.14*  GLUCOSE 107* 70 79   Electrolytes  Recent Labs Lab 06/11/14 0510 06/12/14 0400 06/13/14 0930  CALCIUM 7.4* 7.6* 8.2*  MG 1.6  --   --   PHOS 3.5  --   --    Sepsis Markers  Recent Labs Lab 06/10/14 1539 06/10/14 2220 06/11/14 0510 06/12/14 0400  LATICACIDVEN 2.47* 2.4*  --   --   PROCALCITON  --  >175.00 >175.00 >175.00   ABG  Recent Labs Lab 06/10/14 1815 06/10/14 2205  PHART 7.373 7.342*  PCO2ART 32.3* 32.0*  PO2ART 195.0* 105.0*   Liver Enzymes  Recent Labs Lab 06/10/14 1540 06/11/14 0510 06/13/14 0930  AST 57* 71* 47*  ALT 16 15 18   ALKPHOS 45 27* 49  BILITOT 1.3* 0.8 0.6  ALBUMIN 3.6 2.5* 2.2*   Cardiac Enzymes  Recent Labs Lab 06/13/14 0930  TROPONINI 0.42*   Glucose  Recent Labs  Lab 06/11/14 2320 06/12/14 0402 06/12/14 0440 06/12/14 0813 06/12/14 1224 06/12/14 1619  GLUCAP 148* 69* 103* 78 131* 95    Imaging Right sided airspace disease. Aeration improved.   ASSESSMENT / PLAN:  PULMONARY OETT 3/9 >>> 3/11 A: Acute hypoxic respiratory failure likely due to Severe CAP +/- influenza Wheezing and air-trapping 3/10, no history of asthma or emphysema PAH by echo (PAP 54)  P:   Extubated  Int CXR  CARDIOVASCULAR CVL L IJ 3/9 >>> A:  Septic and cardiogenic shock due to CAP > resolved Elevated BNP (17819) Hx HTN, HLD, systolic CHF (last echo Oct 2015 with EF 35-40%, mild LVH, PAP 54) AFRVR 3/11 DNR Status P:  Keep euvolemic Amiodarone load and gtt 3/11 -change to PO Resume coreg Tele defer anticoagulation   RENAL A:   Hypokalemia - resolved AKI > oliguric, worrisome for ATN-> improved  Mild NAG  metabolic acidosis  P:   Dc  IVFs  Replete K   GASTROINTESTINAL A:   GERD Nutrition needs P:   SUP: Pantoprazole Advance PO  HEMATOLOGIC A:   Leukopenia> left shift on smear, likely due to severe CAP/sepsis VTE Prophylaxis P:  SCD's / Heparin CBC in AM  INFECTIOUS A:   Sepsis due to Severe CAP (gp A strep) Group A strep bacteremia bacteremia  P:   BCx2 3/9 >Grp A strep UCx 3/9 >neg  Sputum Cx 3/9 > gp A strep  Respiratory viral panel 3/9 > Urine strep 3/10 >neg  Urine leg 3/10 >neg   Abx: Vanc, start date 3/9, day 2/x-->d/c 3/11 Abx: Levaquin, start date 3/9, day 2/x-->d/c 3/11 Abx: Ceftriaxone, start date 3/9, dc 3/11 Avx: Tamiflu 3/9 >3/11 Cefazolin 3/11 >>     ENDOCRINE A:   No known issues P:   SSI if glucose consistently > 180  NEUROLOGIC A:   Acute metabolic encephalopathy -resolved Mild dementia P:      Family updated: grand -Daughter at bedside 3/12  Interdisciplinary Family Meeting v Palliative Care Meeting:  Due by: 3/15.   In summary: Elderly woman with CAP due to group a strep pneumonia c/b bacteremia. Septic shock resolved. AFRVR this AM > converted to NSR after amiodarone initiated. -narrowed her abx to cefazolin based on culture results  She has improved substantially since admission and she was highly functional for her advanced age according to her family. Therefore, I suggested that if she fails extubation, we should re-intubate, but short term only. She remains DNR in event of cardiac arrest.   Transfer to tele  The patient is critically ill with multiple organ systems failure and requires high complexity decision making for assessment and support, frequent evaluation and titration of therapies, application of advanced monitoring technologies and extensive interpretation of multiple databases. Critical Care Time devoted to patient care services described in this note independent of APP time is 31 minutes.   Oretha Milch  MD  06/13/2014 11:23 AM

## 2014-06-13 NOTE — Plan of Care (Signed)
Problem: Phase I Progression Outcomes Goal: Progressing towards optiumm acitivities Outcome: Progressing Tolerating, chair position in bed.  PT consult ordered today, pt was independently active prior to hospitalization.  Family reports that she gets OOB early and walks dog short distances, takes her own trash out, etc.  Pt is extremely weak, has difficulty keeping eyes open while talking at this time.  Problem: Phase II Progression Outcomes Goal: Tolerating prescribed nutrition plan Pt is not awake enough to start feeding at this time.  This RN needed to keep her awake and focused on swallowing when administering pills.  No problems swallowing, continued close monitoring.  No problems swallowing prior to hospitalization.

## 2014-06-14 DIAGNOSIS — J1100 Influenza due to unidentified influenza virus with unspecified type of pneumonia: Secondary | ICD-10-CM | POA: Diagnosis present

## 2014-06-14 DIAGNOSIS — J9601 Acute respiratory failure with hypoxia: Secondary | ICD-10-CM | POA: Insufficient documentation

## 2014-06-14 DIAGNOSIS — I10 Essential (primary) hypertension: Secondary | ICD-10-CM

## 2014-06-14 LAB — CBC
HEMATOCRIT: 31 % — AB (ref 36.0–46.0)
Hemoglobin: 10.6 g/dL — ABNORMAL LOW (ref 12.0–15.0)
MCH: 34 pg (ref 26.0–34.0)
MCHC: 34.2 g/dL (ref 30.0–36.0)
MCV: 99.4 fL (ref 78.0–100.0)
Platelets: 228 10*3/uL (ref 150–400)
RBC: 3.12 MIL/uL — AB (ref 3.87–5.11)
RDW: 14.1 % (ref 11.5–15.5)
WBC: 15.6 10*3/uL — ABNORMAL HIGH (ref 4.0–10.5)

## 2014-06-14 LAB — BASIC METABOLIC PANEL
Anion gap: 7 (ref 5–15)
BUN: 29 mg/dL — ABNORMAL HIGH (ref 6–23)
CO2: 21 mmol/L (ref 19–32)
Calcium: 8.2 mg/dL — ABNORMAL LOW (ref 8.4–10.5)
Chloride: 115 mmol/L — ABNORMAL HIGH (ref 96–112)
Creatinine, Ser: 1.08 mg/dL (ref 0.50–1.10)
GFR calc Af Amer: 52 mL/min — ABNORMAL LOW (ref >90)
GFR calc non Af Amer: 45 mL/min — ABNORMAL LOW (ref >90)
Glucose, Bld: 88 mg/dL (ref 70–99)
Potassium: 3.2 mmol/L — ABNORMAL LOW (ref 3.5–5.1)
SODIUM: 143 mmol/L (ref 135–145)

## 2014-06-14 LAB — MAGNESIUM: MAGNESIUM: 1.6 mg/dL (ref 1.5–2.5)

## 2014-06-14 MED ORDER — OSELTAMIVIR PHOSPHATE 30 MG PO CAPS
30.0000 mg | ORAL_CAPSULE | Freq: Two times a day (BID) | ORAL | Status: AC
  Administered 2014-06-15 – 2014-06-17 (×6): 30 mg via ORAL
  Filled 2014-06-14 (×6): qty 1

## 2014-06-14 MED ORDER — BISOPROLOL FUMARATE 5 MG PO TABS
2.5000 mg | ORAL_TABLET | Freq: Every day | ORAL | Status: DC
  Administered 2014-06-14 – 2014-06-18 (×5): 2.5 mg via ORAL
  Filled 2014-06-14 (×5): qty 0.5

## 2014-06-14 MED ORDER — ONDANSETRON HCL 4 MG/2ML IJ SOLN
4.0000 mg | Freq: Four times a day (QID) | INTRAMUSCULAR | Status: DC | PRN
  Administered 2014-06-15: 4 mg via INTRAVENOUS
  Filled 2014-06-14 (×2): qty 2

## 2014-06-14 MED ORDER — ACETAMINOPHEN 325 MG PO TABS
650.0000 mg | ORAL_TABLET | Freq: Four times a day (QID) | ORAL | Status: DC | PRN
  Administered 2014-06-15 – 2014-06-17 (×3): 650 mg via ORAL
  Filled 2014-06-14 (×3): qty 2

## 2014-06-14 MED ORDER — DIPHENHYDRAMINE HCL 50 MG/ML IJ SOLN
12.5000 mg | Freq: Once | INTRAMUSCULAR | Status: DC
  Filled 2014-06-14: qty 1

## 2014-06-14 MED ORDER — ACETAMINOPHEN 325 MG PO TABS
ORAL_TABLET | ORAL | Status: AC
  Filled 2014-06-14: qty 2

## 2014-06-14 MED ORDER — OSELTAMIVIR PHOSPHATE 30 MG PO CAPS
30.0000 mg | ORAL_CAPSULE | Freq: Every day | ORAL | Status: DC
  Filled 2014-06-14: qty 1

## 2014-06-14 MED ORDER — POTASSIUM CHLORIDE 10 MEQ/100ML IV SOLN
10.0000 meq | INTRAVENOUS | Status: AC
  Administered 2014-06-14 (×4): 10 meq via INTRAVENOUS
  Filled 2014-06-14 (×4): qty 100

## 2014-06-14 MED ORDER — DIPHENHYDRAMINE HCL 25 MG PO CAPS: 25.0000 mg | ORAL_CAPSULE | Freq: Once | ORAL | Status: DC

## 2014-06-14 MED ORDER — MAGNESIUM SULFATE 2 GM/50ML IV SOLN
2.0000 g | Freq: Once | INTRAVENOUS | Status: AC
  Administered 2014-06-14: 2 g via INTRAVENOUS
  Filled 2014-06-14: qty 50

## 2014-06-14 NOTE — Progress Notes (Signed)
[facility] PROGRESS NOTE  Traci Hale WUJ:[id number] DOB: 05-Aug-1924 DOA: 06/10/2014 PCP: Cain SaupeFULP, CAMMIE, MD  Summary I have seen and examined Traci Hale at bedside in the presence of her granddaughter and great-granddaughter and reviewed her chart. Traci Hale is a pleasant 81 year old female with essential hypertension, chronic systolic CHF EF 35-40%, among other medical problems, who camefrom home [facility]9 for worsening confusion, and cough and she was found to be septic likely a combination of strep pneumonia/influenza. In ED Chest x-ray suggested right sided PNA, and patient was febrile to 103F.She was initially placed on BiPAP; however, remained hypoxic despite PEEP 10 hence decision made to intubate for short term support, mindful of DNRcode status. She was successfully extubated on 06/12/2014. Respiratory culture was positive for strep pneumonia. Patient also has Influenza type A. She was initially placed on broad-spectrum antibiotics and Tamiflu. Antibiotics have been narrowed to Cefazolin. She remains toxic with white count 15,600, temperature 100.39F and with hypokalemia, potassium 3.2. She still has some confusion. Will continue cefazolin and resume Tamiflu(30 mg twice a day mindful of renal insufficiency as patient still toxic). She also developed A. fib with RVR likely related to ongoing sepsis. She is on amiodarone/beta blocker and off anticoagulation -for risk elevated. Patient will need short-term rehabilitation once clinically stable. She does not engage in meaningful conversation today that her family stated that appetite has been poor. Plan Sepsis/Respiratory failure, acute/Community acquired pneumonia/Streptococcus pneumoniae/Influenza with pneumonia  Continue cefazolin  Tamiflu 30 mg twice a day HTN (hypertension)/Hypokalemia/Chronic systolic heart failure  Continue current medications  Off anticoagulation due to increased fall  risk  Replenish potassium Protein-calorie malnutrition, severe  Supportive care Code Status: DNR Family Communication: Granddaughter at bedside Disposition Plan: Will need short-term rehabilitation placement   Consultants:  Pulmonary and critical care  Procedures:  Ventilator support  Antibiotics: Abx: Vanc, start date 3/9, day 2/x-->d/c 3/11 Abx: Levaquin, start date 3/9, day 2/x-->d/c 3/11 Abx: Ceftriaxone, start date 3/9, dc 3/11 Avx: Tamiflu 3/9 >3/11 and 3/13 > Cefazolin 3/11 >>   HPI/Subjective: Feels okay  Objective: Filed Vitals:   06/14/14 1406  BP: 146/68  Pulse: 79  Temp: 98 F (36.7 C)  Resp: 18    Intake/Output Summary (Last 24 hours) at 06/14/14 1734 Last data filed at 06/14/14 1300  Gross per 24 hour  Intake      0 ml  Output   1201 ml  Net  -1201 ml   Filed Weights   06/10/14 2140 06/11/14 0500 06/12/14 0400  Weight: 52.9 kg (116 lb 10 oz) 58.2 kg (128 lb 4.9 oz) 58.6 kg (129 lb 3 oz)    Exam:   General:  Ill-looking. Toxic. Left IJ triple-lumen in place  Cardiovascular: S1-S2 normal. No murmurs. Pulse regular.  Respiratory: Good air entry bilaterally. Bilateral rhonchi no rales.  Abdomen: Soft and nontender. Normal bowel sounds. No organomegaly.  Musculoskeletal: No pedal edema   Neurological: Appears a little disoriented.  Data Reviewed: Basic Metabolic Panel:  Recent Labs Lab 06/10/14 1540 06/11/14 0510 06/12/14 0400 06/13/14 0930 06/14/14 0345 06/14/14 0425  NA 141 140 142 144 143  --   K 3.0* 4.2 4.2 3.0* 3.2*  --   CL 110 113* 116* 114* 115*  --   CO2 19 18* 18* 20 21  --   GLUCOSE 83 107* 70 79 88  --   BUN 26* 33* 37* 32* 29*  --   CREATININE 1.17* 1.45* 1.40* 1.14* 1.08  --  CALCIUM 8.7 7.4* 7.6* 8.2* 8.2*  --   MG  --  1.6  --   --   --  1.6  PHOS  --  3.5  --   --   --   --    Liver Function Tests:  Recent Labs Lab 06/10/14 1540 06/11/14 0510 06/13/14 0930  AST 57* 71* 47*  ALT ALKPHOS 45 27* 49  BILITOT 1.3* 0.8 0.6  PROT 8.4* 6.6 6.0  ALBUMIN 3.6 2.5* 2.2*   No results for input(s): LIPASE, AMYLASE in the last 168 hours. No results for input(s): AMMONIA in the last 168 hours. CBC:  Recent Labs Lab 06/10/14 1540 06/11/14 0510 06/12/14 0400 06/13/14 0525 06/14/14 0345  WBC 1.6* 1.8* 7.9 11.5* 15.6*  NEUTROABS 1.2* 1.4* 7.2  --   --   HGB 14.2 11.6* 11.8* 9.1* 10.6*  HCT 42.8 35.4* 35.4* 26.1* 31.0*  MCV 104.9* 103.5* 103.2* 100.0 99.4  PLT 210 183 193 173 228   Cardiac Enzymes:  Recent Labs Lab 06/13/14 0930  TROPONINI 0.42*   BNP (last 3 results) No results for input(s): BNP in the last 8760 hours.  ProBNP (last 3 results)  Recent Labs  01/08/14 1401 01/09/14 0040  PROBNP 18570.0* 17819.0*    CBG:  Recent Labs Lab 06/12/14 0402 06/12/14 0440 06/12/14 0813 06/12/14 1224 06/12/14 1619  GLUCAP 69* 103* 78 131* 95    Recent Results (from the past 240 hour(s))  Culture, blood (routine x 2)     Status: None (Preliminary result)   Collection Time: 06/10/14  3:40 PM  Result Value Ref Range Status   Specimen Description BLOOD L FOREARM  Final   Special Requests BOTTLES DRAWN AEROBIC AND ANAEROBIC 2.5ML  Final   Culture   Final           BLOOD CULTURE RECEIVED NO GROWTH TO DATE CULTURE WILL BE HELD FOR 5 DAYS BEFORE ISSUING A FINAL NEGATIVE REPORT Performed at [facility]    Report Status PENDING  Incomplete  Culture, blood (routine x 2)     Status: None   Collection Time: 06/10/14  3:50 PM  Result Value Ref Range Status   Specimen Description BLOOD R FOREARM  Final   Special Requests BOTTLES DRAWN AEROBIC AND ANAEROBIC 5 ML  Final   Culture   Final    GROUP A STREP (S.PYOGENES) ISOLATED Note: CRITICAL RESULT CALLED TO, READ BACK BY AND VERIFIED WITH: MEGAN SHIFLETT @ 1059 ON [id number] BY [facility] FAXED TO [facility] ATTN CONNIE WEANT [id number] BY MARHE Note: Gram Stain Report Called to,Read Back By and Verified With:  MEGAN SCHOETT 06/11/14 0950 BY SMITHERSJ Performed at [facility]    Report Status 06/12/2014 FINAL  Final  Urine culture     Status: None   Collection Time: 06/10/14  4:44 PM  Result Value Ref Range Status   Specimen Description URINE, CATHETERIZED  Final   Special Requests NONE  Final   Colony Count NO GROWTH Performed at [facility]   Final   Culture NO GROWTH Performed at [facility]   Final   Report Status 06/12/2014 FINAL  Final  Culture, respiratory (NON-Expectorated)     Status: None   Collection Time: 06/10/14 10:00 PM  Result Value Ref Range Status   Specimen Description TRACHEAL ASPIRATE  Final   Special Requests NONE  Final   Gram Stain   Final    RARE WBC PRESENT,  PREDOMINANTLY MONONUCLEAR NO SQUAMOUS EPITHELIAL CELLS SEEN NO ORGANISMS SEEN Performed at [facility]    Culture   Final    FEW GROUP A STREP (S.PYOGENES) ISOLATED Performed at [facility]    Report Status 06/12/2014 FINAL  Final  MRSA PCR Screening     Status: None   Collection Time: 06/10/14 10:44 PM  Result Value Ref Range Status   MRSA by PCR NEGATIVE NEGATIVE Final    Comment:        The GeneXpert MRSA Assay (FDA approved for NASAL specimens only), is one component of a comprehensive MRSA colonization surveillance program. It is not intended to diagnose MRSA infection nor to guide or monitor treatment for MRSA infections. Performed at [facility]   Respiratory virus panel     Status: Abnormal   Collection Time: 06/11/14 10:32 AM  Result Value Ref Range Status   Respiratory Syncytial Virus A Negative Negative Final   Respiratory Syncytial Virus B Negative Negative Final   Influenza A Positive (A) Negative Final    Comment: Positive for Influenza A 2009 H1N1   Influenza B Negative Negative Final   Parainfluenza 1 Negative Negative Final   Parainfluenza 2 Negative Negative Final   Parainfluenza 3 Negative Negative Final    Metapneumovirus Negative Negative Final   Rhinovirus Negative Negative Final   Adenovirus Negative Negative Final    Comment: (NOTE) Performed At: [facility] [address] [place], [place] [id number] Mila Homer MD WU:[id number]      Studies: Dg Chest Port 1 View  06/13/2014   CLINICAL DATA:  Aspiration pneumonia. History of hypertension, CHF. Former smoker.  EXAM: PORTABLE CHEST - 1 VIEW  COMPARISON:  06/12/2014; 06/11/2014; 06/10/2014  FINDINGS: Grossly unchanged enlarged cardiac silhouette and mediastinal contours given persistent reduced lung volumes and patient rotation. Atherosclerotic plaque within the thoracic aorta. Interval extubation removal of enteric tube. Stable position of left jugular approach intravenous catheter with tip projected over the mid SVC. No pneumothorax.  Worsening right mid, lower and left upper lung ill-defined heterogeneous interstitial and airspace opacities with relative areas of confluence within the right lung base. Trace left-sided effusion is not excluded. Unchanged bones.  IMPRESSION: 1. Interval extubation removal of enteric tube. Otherwise, stable positioning of remaining support apparatus. No pneumothorax. 2. Worsening bilateral heterogeneous interstitial and airspace opacities, possibly accentuated due to patient positioning though additional differential considerations include asymmetric pulmonary edema versus progression of multifocal infection and/or aspiration. Continued attention on follow-up is recommended.   Electronically Signed   By: Simonne Come M.D.   On: 06/13/2014 08:42    Scheduled Meds: . amiodarone  200 mg Oral Daily  . aspirin  325 mg Per Tube Daily  . bisoprolol  2.5 mg Oral Daily  .  ceFAZolin (ANCEF) IV  1 g Intravenous Q12H  . diphenhydrAMINE  12.5 mg Intravenous Once  . enoxaparin (LOVENOX) injection  30 mg Subcutaneous Q24H  . furosemide  20 mg Oral Daily  . magnesium sulfate 1 - 4 g bolus IVPB  2 g Intravenous  Once  . oseltamivir  30 mg Oral BID  . sodium chloride  10-40 mL Intracatheter Q12H   Continuous Infusions:    Time spent: 25 minutes    Vendela Troung  [facility] Pager [phone]. If 7PM-7AM, please contact night-coverage at [email or web address], password [facility] 06/14/2014, 5:34 PM  LOS: 4 days

## 2014-06-14 NOTE — Progress Notes (Signed)
PULMONARY / CRITICAL CARE MEDICINE   Name: Traci Hale MRN: [id number] DOB: 01/29/1925    ADMISSION DATE:  06/10/2014 CONSULTATION DATE:   06/11/14  REFERRING MD :  Adela Glimpse  CHIEF COMPLAINT:  Confusion, cough  INITIAL PRESENTATION:  89 yobf  from home brought to [facility] ED 3/9 for worsening confusion, cough.  In ED, found to have right sided PNA, febrile to 103.  Initially placed on BiPAP; however, remained hypoxic despite PEEP 10.  Decision made to intubate for short term support.  Code status DNR.   STUDIES:  CXR 3/9 >>> diffuse R sided lung disease, ? Patchy LUL opacity CT head/c-spine 3/9 >>> no acute findings R Knee 3/9 >>> no acute findings R hip 3/9 >>> no acute findings  SIGNIFICANT EVENTS: 3/9 - admit, intubated 3/10 passed sbt but in AF w/ RVR. Loaded w/ amio 3/11 extubated   SUBJECTIVE:    Some confusion overnight / poor cough effort but not requiring  02   VITAL SIGNS: Temp:  [97.3 F (36.3 C)-100.2 F (37.9 C)] 98.5 F (36.9 C) (03/13 0536) Pulse Rate:  [81-104] 104 (03/13 0536) Resp:  [20-24] 20 (03/13 0536) BP: (127-153)/(49-75) 127/68 mmHg (03/13 0536) SpO2:  [91 %-98 %] 91 % (03/13 0902) HEMODYNAMICS:   VENTILATOR SETTINGS:   INTAKE / OUTPUT: Intake/Output      03/12 0701 - 03/13 0700 03/13 0701 - 03/14 0700   I.V. (mL/kg) 203.5 (3.5)    NG/GT     IV Piggyback 200    Total Intake(mL/kg) 403.5 (6.9)    Urine (mL/kg/hr) 1900 (1.4)    Stool 1 (0)    Total Output 1901     Net -1497.5            PHYSICAL EXAMINATION: Gen: frail female  HEENT: NCAT EOMi PULM:  Minimal bilateral insp/exp rhonchi CV:  , irreg irreg Pulse around 100  AB: BS+, soft, nontender Ext: cool, no edema Neuro: oriented to fm only   LABS:  CBC  Recent Labs Lab 06/12/14 0400 06/13/14 0525 06/14/14 0345  WBC 7.9 11.5* 15.6*  HGB 11.8* 9.1* 10.6*  HCT 35.4* 26.1* 31.0*  PLT 193 173 228   Coag's No results for input(s): APTT, INR in the last 168  hours. BMET  Recent Labs Lab 06/12/14 0400 06/13/14 0930 06/14/14 0345  NA 142 144 143  K 4.2 3.0* 3.2*  CL 116* 114* 115*  CO2 18* 20 21  BUN 37* 32* 29*  CREATININE 1.40* 1.14* 1.08  GLUCOSE 70 79 88   Electrolytes  Recent Labs Lab 06/11/14 0510 06/12/14 0400 06/13/14 0930 06/14/14 0345 06/14/14 0425  CALCIUM 7.4* 7.6* 8.2* 8.2*  --   MG 1.6  --   --   --  1.6  PHOS 3.5  --   --   --   --    Sepsis Markers  Recent Labs Lab 06/10/14 1539 06/10/14 2220 06/11/14 0510 06/12/14 0400  LATICACIDVEN 2.47* 2.4*  --   --   PROCALCITON  --  >175.00 >175.00 >175.00   ABG  Recent Labs Lab 06/10/14 1815 06/10/14 2205  PHART 7.373 7.342*  PCO2ART 32.3* 32.0*  PO2ART 195.0* 105.0*   Liver Enzymes  Recent Labs Lab 06/10/14 1540 06/11/14 0510 06/13/14 0930  AST 57* 71* 47*  ALT ALKPHOS 45 27* 49  BILITOT 1.3* 0.8 0.6  ALBUMIN 3.6 2.5* 2.2*   Cardiac Enzymes  Recent Labs Lab 06/13/14 0930  TROPONINI 0.42*   Glucose  Recent Labs Lab 06/11/14 2320 06/12/14 0402 06/12/14 0440 06/12/14 0813 06/12/14 1224 06/12/14 1619  GLUCAP 148* 69* 103* 78 131* 95    Imaging Right sided airspace disease. Aeration improved.   ASSESSMENT / PLAN:  PULMONARY OETT 3/9 >>> 3/11 A: Acute hypoxic respiratory failure likely due to Severe CAP +/- influenza Wheezing and air-trapping 3/10, no history of asthma or emphysema PAH by echo (PAP 54)  P:   Extubated  Int CXR  CARDIOVASCULAR CVL L IJ 3/9 >>> A:  Septic and cardiogenic shock due to CAP > resolved Elevated BNP (17819) Hx HTN, HLD, systolic CHF (last echo Oct 2015 with EF 35-40%, mild LVH, PAP 54) AFRVR 3/11 DNR Status P:  Keep euvolemic Amiodarone load and gtt 3/11 -change to PO Resume coreg> changed to bisoprolol 3/13 Tele defer anticoagulation   RENAL A:   Hypokalemia - resolved AKI > oliguric, worrisome for ATN-> improved  Mild NAG metabolic acidosis  P:   Dc  IVFs  Replete  K   GASTROINTESTINAL A:   GERD Nutrition needs P:   SUP: Pantoprazole Advance PO  HEMATOLOGIC A:   Leukopenia> left shift on smear, likely due to severe CAP/sepsis VTE Prophylaxis P:  SCD's / Heparin CBC in AM  INFECTIOUS A:   Sepsis due to Severe CAP (gp A strep) Group A strep bacteremia bacteremia  P:   BCx2 3/9 >Grp A strep UCx 3/9 >neg  Sputum Cx 3/9 > gp A strep  Respiratory viral panel 3/10 > pos Influenza A Urine strep 3/10 >neg  Urine leg 3/10 >neg   Abx: Vanc, start date 3/9, day 2/x-->d/c 3/11 Abx: Levaquin, start date 3/9, day 2/x-->d/c 3/11 Abx: Ceftriaxone, start date 3/9, dc 3/11 Avx: Tamiflu 3/9 >3/11 and 3/13 x 3 doses  Cefazolin 3/11 >>     ENDOCRINE A:   No known issues P:   SSI if glucose consistently > 180  NEUROLOGIC A:   Acute metabolic encephalopathy -resolved Mild dementia P:      Family updated: grand -Daughter at bedside 3/13     Very marginal due to poor mental status/ poor cough mechanics but not requiring any 02 so no change in rx   [facility] assuming care so PCCM will sign off - please call if needed    Sandrea HughsMichael Khadim Lundberg, MD Pulmonary and Critical Care Medicine [facility] Cell [phone] After 5:30 PM or weekends, call [phone]

## 2014-06-14 NOTE — Evaluation (Signed)
Physical Therapy Evaluation Patient Details Name: CASSEY BACIGALUPO MRN: [id number] DOB: 07-Oct-1924 Today's Date: 06/14/2014   History of Present Illness  Pt is an 81 year old female admitted for Acute hypoxic respiratory failure likely due to Severe CAP with OETT 3/9-3/11 and septic and cardiogenic shock.  Clinical Impression  Pt admitted with above diagnosis. Pt currently with functional limitations due to the deficits listed below (see PT Problem List).  Pt will benefit from skilled PT to increase their independence and safety with mobility to allow discharge to the venue listed below.  Llimited evaluation due to lethargy.  Recommend SNF at this time as pt currently total assist +2 and unable to tolerate sitting EOB.     Follow Up Recommendations SNF    Equipment Recommendations  Wheelchair (measurements PT);Hospital bed    Recommendations for Other Services       Precautions / Restrictions Precautions Precautions: Fall Restrictions Weight Bearing Restrictions: No      Mobility  Bed Mobility Overal bed mobility: Needs Assistance;+2 for physical assistance Bed Mobility: Supine to Sit;Sit to Supine     Supine to sit: +2 for physical assistance;Total assist Sit to supine: +2 for physical assistance;Total assist   General bed mobility comments: pt agreeable to sit EOB however did not initiate or assist, ?due to weakness and/or lethargy  Transfers Overall transfer level:  (not safe at this time)                  Ambulation/Gait                Stairs            Wheelchair Mobility    Modified Rankin (Stroke Patients Only)       Balance Overall balance assessment: Needs assistance Sitting-balance support: Bilateral upper extremity supported;Feet supported Sitting balance-Leahy Scale: Zero                                       Pertinent Vitals/Pain Pain Assessment: No/denies pain  Continuous pulse ox - lowest reading 90% room  air during session    Home Living Family/patient expects to be discharged to:: Private residence Living Arrangements: Other relatives (granddaughter)   Type of Home: House       Home Layout: One level Home Equipment: Gilmer Mor - single point;Walker - 2 wheels      Prior Function Level of Independence: Independent with assistive device(s)               Hand Dominance        Extremity/Trunk Assessment   Upper Extremity Assessment: Generalized weakness;Difficult to assess due to impaired cognition           Lower Extremity Assessment: Generalized weakness;Difficult to assess due to impaired cognition         Communication   Communication: HOH  Cognition Arousal/Alertness: Lethargic   Overall Cognitive Status: Impaired/Different from baseline Area of Impairment: Following commands       Following Commands: Follows one step commands inconsistently       General Comments: difficult to assess due to poor ability to maintain awake/alert/arousal    General Comments      Exercises        Assessment/Plan    PT Assessment Patient needs continued PT services  PT Diagnosis Difficulty walking;Generalized weakness   PT Problem List Decreased strength;Decreased activity tolerance;Decreased mobility;Decreased balance;Decreased cognition  PT  Treatment Interventions DME instruction;Gait training;Patient/family education;Functional mobility training;Therapeutic activities;Therapeutic exercise;Balance training;Neuromuscular re-education   PT Goals (Current goals can be found in the Care Plan section) Acute Rehab PT Goals PT Goal Formulation: With patient/family Time For Goal Achievement: 06/28/14 Potential to Achieve Goals: Fair    Frequency Min 3X/week   Barriers to discharge        Co-evaluation               End of Session   Activity Tolerance: Patient limited by lethargy Patient left: in bed;with call bell/phone within reach;with family/visitor  present Nurse Communication: Mobility status         Time: [id number] PT Time Calculation (min) (ACUTE ONLY): 15 min   Charges:   PT Evaluation $Initial PT Evaluation Tier I: 1 Procedure     PT G Codes:        Susi Goslin,KATHrine E 06/14/2014, 11:32 AM Zenovia JarredKati Sydnee Lamour, PT, DPT 06/14/2014 Pager: [phone]

## 2014-06-15 ENCOUNTER — Inpatient Hospital Stay (HOSPITAL_COMMUNITY): Payer: Commercial Managed Care - HMO

## 2014-06-15 LAB — COMPREHENSIVE METABOLIC PANEL
ALT: 21 U/L (ref 0–35)
ANION GAP: 8 (ref 5–15)
AST: 69 U/L — ABNORMAL HIGH (ref 0–37)
Albumin: 2.5 g/dL — ABNORMAL LOW (ref 3.5–5.2)
Alkaline Phosphatase: 84 U/L (ref 39–117)
BUN: 32 mg/dL — ABNORMAL HIGH (ref 6–23)
CO2: 23 mmol/L (ref 19–32)
Calcium: 8.6 mg/dL (ref 8.4–10.5)
Chloride: 113 mmol/L — ABNORMAL HIGH (ref 96–112)
Creatinine, Ser: 0.88 mg/dL (ref 0.50–1.10)
GFR calc Af Amer: 67 mL/min — ABNORMAL LOW (ref >90)
GFR, EST NON AFRICAN AMERICAN: 57 mL/min — AB (ref >90)
GLUCOSE: 83 mg/dL (ref 70–99)
POTASSIUM: 4 mmol/L (ref 3.5–5.1)
Sodium: 144 mmol/L (ref 135–145)
TOTAL PROTEIN: 7 g/dL (ref 6.0–8.3)
Total Bilirubin: 0.9 mg/dL (ref 0.3–1.2)

## 2014-06-15 LAB — PROTEIN ELECTROPHORESIS, SERUM
ALPHA-1-GLOBULIN: 6.5 % — AB (ref 2.9–4.9)
Albumin ELP: 44.1 % — ABNORMAL LOW (ref 55.8–66.1)
Alpha-2-Globulin: 14.5 % — ABNORMAL HIGH (ref 7.1–11.8)
BETA 2: 7.5 % — AB (ref 3.2–6.5)
Beta Globulin: 4.8 % (ref 4.7–7.2)
Gamma Globulin: 22.6 % — ABNORMAL HIGH (ref 11.1–18.8)
M-SPIKE, %: NOT DETECTED g/dL
Total Protein ELP: 5.8 g/dL — ABNORMAL LOW (ref 6.0–8.3)

## 2014-06-15 LAB — UIFE/LIGHT CHAINS/TP QN, 24-HR UR
ALBUMIN, U: DETECTED
Alpha 1, Urine: DETECTED — AB
Alpha 2, Urine: DETECTED — AB
Beta, Urine: DETECTED — AB
GAMMA UR: DETECTED — AB
TIME-UPE24: 24 h
Total Protein, Urine-Ur/day: 178 mg/d — ABNORMAL HIGH (ref <150)
Total Protein, Urine: 356 mg/dL — ABNORMAL HIGH (ref 5–24)
VOLUME, URINE-UPE24: 50 mL

## 2014-06-15 LAB — CBC
HCT: 31.9 % — ABNORMAL LOW (ref 36.0–46.0)
Hemoglobin: 10.6 g/dL — ABNORMAL LOW (ref 12.0–15.0)
MCH: 33.7 pg (ref 26.0–34.0)
MCHC: 33.2 g/dL (ref 30.0–36.0)
MCV: 101.3 fL — AB (ref 78.0–100.0)
Platelets: 253 10*3/uL (ref 150–400)
RBC: 3.15 MIL/uL — AB (ref 3.87–5.11)
RDW: 14.6 % (ref 11.5–15.5)
WBC: 17.4 10*3/uL — AB (ref 4.0–10.5)

## 2014-06-15 LAB — PHOSPHORUS: Phosphorus: 3.1 mg/dL (ref 2.3–4.6)

## 2014-06-15 LAB — MAGNESIUM: MAGNESIUM: 2.4 mg/dL (ref 1.5–2.5)

## 2014-06-15 MED ORDER — PIPERACILLIN-TAZOBACTAM 3.375 G IVPB 30 MIN
3.3750 g | Freq: Once | INTRAVENOUS | Status: AC
  Administered 2014-06-15: 3.375 g via INTRAVENOUS
  Filled 2014-06-15: qty 50

## 2014-06-15 MED ORDER — CEFAZOLIN SODIUM 1-5 GM-% IV SOLN
1.0000 g | Freq: Three times a day (TID) | INTRAVENOUS | Status: DC
  Administered 2014-06-15 (×2): 1 g via INTRAVENOUS
  Filled 2014-06-15 (×2): qty 50

## 2014-06-15 MED ORDER — BOOST / RESOURCE BREEZE PO LIQD
1.0000 | Freq: Three times a day (TID) | ORAL | Status: DC
  Administered 2014-06-15 – 2014-06-18 (×9): 1 via ORAL

## 2014-06-15 MED ORDER — PIPERACILLIN-TAZOBACTAM 3.375 G IVPB
3.3750 g | Freq: Three times a day (TID) | INTRAVENOUS | Status: AC
  Administered 2014-06-16 – 2014-06-18 (×7): 3.375 g via INTRAVENOUS
  Filled 2014-06-15 (×7): qty 50

## 2014-06-15 NOTE — Progress Notes (Signed)
Clinical Social Work  Per chart review, PT recommends SNF placement. CSW went to room but patient disoriented and no family at bedside. CSW called dtr Stanton Kidney(Debra [phone]) but no answer and unable to leave a message. CSW called granddtr [name] and left a message to discuss DC plans. CSW will continue to follow and will complete full assessment once family returns call.  PetronilaHolly Arlington Sigmund, [place] [phone]

## 2014-06-15 NOTE — Progress Notes (Signed)
ANTIBIOTIC CONSULT NOTE - INITIAL  Pharmacy Consult for Zosyn Indication: pneumonia  No Known Allergies  Patient Measurements: Height:  (157.5 cm) Weight: 129 lb 3 oz (58.6 kg) IBW/kg (Calculated) : 50.1 Adjusted Body Weight: n/a  Vital Signs: Temp: 97.6 F (36.4 C) (03/14 2123) Temp Source: Oral (03/14 2123) BP: 120/64 mmHg (03/14 2123) Pulse Rate: 72 (03/14 2123) Intake/Output from previous day: 03/13 0701 - 03/14 0700 In: -  Out: 301 [Urine:300; Stool:1] Intake/Output from this shift: Total I/O In: -  Out: 550 [Urine:550]  Labs:  Recent Labs  06/13/14 0525 06/13/14 0930 06/14/14 0345 06/15/14 0525  WBC 11.5*  --  15.6* 17.4*  HGB 9.1*  --  10.6* 10.6*  PLT 173  --  228 253  CREATININE  --  1.14* 1.08 0.88   Estimated Creatinine Clearance: 35.6 mL/min (by C-G formula based on Cr of 0.88). No results for input(s): VANCOTROUGH, VANCOPEAK, VANCORANDOM, GENTTROUGH, GENTPEAK, GENTRANDOM, TOBRATROUGH, TOBRAPEAK, TOBRARND, AMIKACINPEAK, AMIKACINTROU, AMIKACIN in the last 72 hours.   Microbiology: Recent Results (from the past 720 hour(s))  Culture, blood (routine x 2)     Status: None (Preliminary result)   Collection Time: 06/10/14  3:40 PM  Result Value Ref Range Status   Specimen Description BLOOD L FOREARM  Final   Special Requests BOTTLES DRAWN AEROBIC AND ANAEROBIC 2.5ML  Final   Culture   Final           BLOOD CULTURE RECEIVED NO GROWTH TO DATE CULTURE WILL BE HELD FOR 5 DAYS BEFORE ISSUING A FINAL NEGATIVE REPORT Performed at [facility]    Report Status PENDING  Incomplete  Culture, blood (routine x 2)     Status: None   Collection Time: 06/10/14  3:50 PM  Result Value Ref Range Status   Specimen Description BLOOD R FOREARM  Final   Special Requests BOTTLES DRAWN AEROBIC AND ANAEROBIC 5 ML  Final   Culture   Final    GROUP A STREP (S.PYOGENES) ISOLATED Note: CRITICAL RESULT CALLED TO, READ BACK BY AND VERIFIED WITH: MEGAN SHIFLETT @  1059 ON [id number] BY [facility] FAXED TO [place] ATTN CONNIE WEANT [id number] BY MARHE Note: Gram Stain Report Called to,Read Back By and Verified With: MEGAN SCHOETT 06/11/14 0950 BY SMITHERSJ Performed at [facility]    Report Status 06/12/2014 FINAL  Final  Urine culture     Status: None   Collection Time: 06/10/14  4:44 PM  Result Value Ref Range Status   Specimen Description URINE, CATHETERIZED  Final   Special Requests NONE  Final   Colony Count NO GROWTH Performed at [facility]   Final   Culture NO GROWTH Performed at [facility]   Final   Report Status 06/12/2014 FINAL  Final  Culture, respiratory (NON-Expectorated)     Status: None   Collection Time: 06/10/14 10:00 PM  Result Value Ref Range Status   Specimen Description TRACHEAL ASPIRATE  Final   Special Requests NONE  Final   Gram Stain   Final    RARE WBC PRESENT, PREDOMINANTLY MONONUCLEAR NO SQUAMOUS EPITHELIAL CELLS SEEN NO ORGANISMS SEEN Performed at [facility]    Culture   Final    FEW GROUP A STREP (S.PYOGENES) ISOLATED Performed at [facility]    Report Status 06/12/2014 FINAL  Final  MRSA PCR Screening     Status: None   Collection Time: 06/10/14 10:44 PM  Result Value Ref Range Status  MRSA by PCR NEGATIVE NEGATIVE Final    Comment:        The GeneXpert MRSA Assay (FDA approved for NASAL specimens only), is one component of a comprehensive MRSA colonization surveillance program. It is not intended to diagnose MRSA infection nor to guide or monitor treatment for MRSA infections. Performed at [facility]   Respiratory virus panel     Status: Abnormal   Collection Time: 06/11/14 10:32 AM  Result Value Ref Range Status   Respiratory Syncytial Virus A Negative Negative Final   Respiratory Syncytial Virus B Negative Negative Final   Influenza A Positive (A) Negative Final    Comment: Positive for Influenza A 2009 H1N1   Influenza B Negative  Negative Final   Parainfluenza 1 Negative Negative Final   Parainfluenza 2 Negative Negative Final   Parainfluenza 3 Negative Negative Final   Metapneumovirus Negative Negative Final   Rhinovirus Negative Negative Final   Adenovirus Negative Negative Final    Comment: (NOTE) Performed At: [facility] [address] [place], [place] [id number] Mila Homer MD WU:[id number]     Medical History: Past Medical History  Diagnosis Date  . Hypertension   . CHF (congestive heart failure)   . Hyperlipidemia   . Heart murmur   . Shortness of breath   . GERD (gastroesophageal reflux disease)     Medications:  Scheduled:  . amiodarone  200 mg Oral Daily  . aspirin  325 mg Per Tube Daily  . bisoprolol  2.5 mg Oral Daily  . diphenhydrAMINE  12.5 mg Intravenous Once  . enoxaparin (LOVENOX) injection  30 mg Subcutaneous Q24H  . feeding supplement (RESOURCE BREEZE)  1 Container Oral TID BM  . furosemide  20 mg Oral Daily  . oseltamivir  30 mg Oral BID  . [START ON 06/16/2014] piperacillin-tazobactam (ZOSYN)  IV  3.375 g Intravenous Q8H  . sodium chloride  10-40 mL Intracatheter Q12H   Infusions:   PRN: acetaminophen, ipratropium-albuterol, metoprolol, ondansetron (ZOFRAN) IV, sodium chloride Anti-infectives    Start     Dose/Rate Route Frequency Ordered Stop   06/16/14 0400  piperacillin-tazobactam (ZOSYN) IVPB 3.375 g     3.375 g 12.5 mL/hr over 240 Minutes Intravenous Every 8 hours 06/15/14 2041     06/15/14 2200  piperacillin-tazobactam (ZOSYN) IVPB 3.375 g     3.375 g 100 mL/hr over 30 Minutes Intravenous  Once 06/15/14 2041 06/15/14 2200   06/15/14 0900  ceFAZolin (ANCEF) IVPB 1 g/50 mL premix  Status:  Discontinued     1 g 100 mL/hr over 30 Minutes Intravenous Every 8 hours 06/15/14 0842 06/15/14 1903   06/14/14 2200  oseltamivir (TAMIFLU) capsule 30 mg     30 mg Oral 2 times daily 06/14/14 1731 06/17/14 2159   06/14/14 1200  oseltamivir (TAMIFLU) capsule 30 mg   Status:  Discontinued     30 mg Oral Daily 06/14/14 1049 06/14/14 1731   06/13/14 1000  ceFAZolin (ANCEF) IVPB 1 g/50 mL premix  Status:  Discontinued     1 g 100 mL/hr over 30 Minutes Intravenous Every 12 hours 06/12/14 1550 06/15/14 0842   06/11/14 1600  vancomycin (VANCOCIN) 500 mg in sodium chloride 0.9 % 100 mL IVPB  Status:  Discontinued     500 mg 100 mL/hr over 60 Minutes Intravenous Every 24 hours 06/10/14 2043 06/12/14 1055   06/11/14 1200  cefTRIAXone (ROCEPHIN) 2 g in dextrose 5 % 50 mL IVPB - Premix  Status:  Discontinued  2 g 100 mL/hr over 30 Minutes Intravenous Every 24 hours 06/11/14 0956 06/12/14 1536   06/10/14 2330  oseltamivir (TAMIFLU) 6 MG/ML suspension 30 mg  Status:  Discontinued     30 mg Per Tube Daily at bedtime 06/10/14 2312 06/12/14 1019   06/10/14 2200  piperacillin-tazobactam (ZOSYN) IVPB 3.375 g  Status:  Discontinued     3.375 g 100 mL/hr over 30 Minutes Intravenous 3 times per day 06/10/14 2135 06/10/14 2228   06/10/14 2200  piperacillin-tazobactam (ZOSYN) IVPB 3.375 g  Status:  Discontinued     3.375 g 12.5 mL/hr over 240 Minutes Intravenous 3 times per day 06/10/14 2043 06/10/14 2101   06/10/14 2200  oseltamivir (TAMIFLU) capsule 75 mg  Status:  Discontinued     75 mg Per Tube 2 times daily 06/10/14 2101 06/10/14 2312   06/10/14 2200  cefTRIAXone (ROCEPHIN) 1 g in dextrose 5 % 50 mL IVPB  Status:  Discontinued     1 g 100 mL/hr over 30 Minutes Intravenous Every 24 hours 06/10/14 2127 06/11/14 0955   06/10/14 2200  levofloxacin (LEVAQUIN) IVPB 750 mg  Status:  Discontinued     750 mg 100 mL/hr over 90 Minutes Intravenous Every 48 hours 06/10/14 2127 06/12/14 1055   06/10/14 1545  piperacillin-tazobactam (ZOSYN) IVPB 3.375 g     3.375 g 100 mL/hr over 30 Minutes Intravenous  Once 06/10/14 1543 06/10/14 1642   06/10/14 1545  vancomycin (VANCOCIN) IVPB 1000 mg/200 mL premix     1,000 mg 200 mL/hr over 60 Minutes Intravenous  Once 06/10/14 1543  06/10/14 1931     Assessment 89yoF presenting with AMS and fever. Endorses cough for last few days, no vomiting. Pharmacy to dose vancomycin and Zosyn for sepsis. PMH CHF, HTN Originally started on Vanc/ceftriaxone which was narrowed to Levaquin and then cefazolin.  Also receiving Tamiflu for influenza A.  Today MD notes patient "remains toxic with white count 17,400, still with some confusion and generalized weakness. There may be a component of aspiration as she has weak cough, can't clear secretions and chest x-ray this evening shows Multifocal patchy opacities, right lower lobe predominant, suspicious for multifocal pneumonia. Will discontinue cefazolin, start Zosyn and continue Tamiflu  3/9 >> ceftriaxone >> 3/11 3/9 >> vancomycin >> 3/11 3/9 >> Zosyn >> 3/9 3/9 >> levaquin >> 3/11 3/9 >> Tamiflu (dose reduced for renal dysfx) >> 3/11 3/13 >> Tamiflu resumed, dose reduced >> 3/11 >> cefazolin >>  Temp: afeb WBC: trending up 17.4 Renal: scr trending down 0.88 (crcl~36) LA 2.47 PCT: > 175, > 175  3/9 blood: 1 of 2 group A strep 3/9 urine: NGF (UA w/ neg leukocytes but many bacteria) FINAL 3/9 strep/legionella UAg: neg/neg 3/9 trach asp: few group A strep FINAL 3/10 Resp virus panel: positive Influenza A  Drug level / dose changes info:  3/10: D2 vanc 500 q12, ZEI, and Levaquin for sepsis/CAP. --2100 - received add'l consults for levaquin and CTX for CAP. Per PCCM to keep vanc on for tonight and re-evaluate patient in AM -increased Rocephin to 2gm q24 for Strep pneumo 3/11: D3 ABX - CCM narrowed to Rocephin only 3/13: D5 abx, D3 Cefazolin - dose 1gm IV q12h for renal fx (as would normally dose 2gm q8h). Started 24h from CTX dose 3/14: change ancef to 1gm q8h for renal funct; f/u stop date for tamiflu --PM: sw/ to Zosyn   Goal of Therapy:   Eradication of infection  Appropriate antibiotic dosing for indication and  renal function  Plan:   Zosyn 3.375 g IV x1 over  30 min to start at 2200, then 3.375 g IV q8h starting tomorrow at 0400  Follow clinical course, culture results as available, renal function  Follow for de-escalation of antibiotics and LOT   Bernadene Personrew Jaylon Grode, PharmD Pager: [phone] 06/15/2014, 10:55 PM

## 2014-06-15 NOTE — Progress Notes (Addendum)
Clinical Social Work Department BRIEF PSYCHOSOCIAL ASSESSMENT 06/15/2014  Patient:  Traci Hale,Traci Hale     Account Number:  [id number]     Admit date:  06/10/2014  Clinical Social Worker:  Dennison BullaGERBER,Trevion Hoben, LCSW  Date/Time:  06/15/2014 02:30 PM  Referred by:  Physician  Date Referred:  06/15/2014 Referred for  SNF Placement   Other Referral:   Interview type:  Family Other interview type:    PSYCHOSOCIAL DATA Living Status:  FAMILY Admitted from facility:   Level of care:   Primary support name:  Traci Hale Primary support relationship to patient:  FAMILY Degree of support available:   Strong    CURRENT CONCERNS Current Concerns  Post-Acute Placement   Other Concerns:    SOCIAL WORK ASSESSMENT / PLAN CSW received referral in order to assist with DC planning. CSW reviewed chart and attempted to meet with patient but patient disoriented. CSW received a return call from granddtr Traci Hale(Traci Hale).    Patient lives at home with granddtr but granddtr works from [employer]. Traci BlalockGranddtr reports that patient was independent and doing well. Patient would complete all ADLs and even walked their dog. Last Tuesday, patient became weak and confused and required more assistance. Granddtr reports she manages patient's medications but that she was able to stay alone when she was at work during the day. CSW explained PT recommendations for SNF placement and family agreeable. Patient has been to [facility] in the past and it was a good experience. Family hopeful that patient can return there for SNF. CSW explained that SNF list was left in the room and that insurance would have to approve SNF stay. [name] and CSW will follow up with bed offers.    CSW completed FL2 and faxed out. CSW will continue to follow.   Assessment/plan status:  Psychosocial Support/Ongoing Assessment of Needs Other assessment/ plan:   Information/referral to community resources:   SNF list     PATIENT'S/FAMILY'S RESPONSE TO PLAN OF CARE: Patient unable to participate in assessment. Family engaged and reports they want what is best for patient. Family aware that patient is deconditioned and unable to stay home alone while they are at work. Family is hopeful that patient can return to [facility] because they feel patient will be comfortable in a familiar environment. Family aware of SNF process and will call CSW with any further questions.       Traci Hale, [place] [phone]  Addendum [phone] CSW received a return call from dtr Traci Hale(Traci Hale) who reports she saw SNF list in the room and is agreeable to SNF as well. Dtr reports patient has been to SNF in the past and is agreeable to SNF again. Family to review list and will call CSW with further questions.

## 2014-06-15 NOTE — Progress Notes (Signed)
[facility] PROGRESS NOTE  Traci Hale ZOX:[id number] DOB: 03-Aug-1924 DOA: 06/10/2014 PCP: Cain Saupe, MD  Summary I have seen and examined Traci Hale at bedside in the presence of her daughter and reviewed her chart. Traci Hale is a pleasant 81 year old female with essential hypertension, chronic systolic CHF EF 35-40%, among other medical problems, who [facility] for worsening confusion, and cough and she was found to be septic likely a combination of strep pneumonia/influenza. In ED Chest x-ray suggested right sided PNA, and patient was febrile to 103F.She was initially placed on BiPAP; however, remained hypoxic despite PEEP 10 hence decision made to intubate for short term support, mindful of DNRcode status. She was successfully extubated on 06/12/2014. Respiratory culture was positive for strep pneumonia. Patient also has Influenza type A. She was initially placed on broad-spectrum antibiotics and Tamiflu. Antibiotics were narrowed to Cefazolin. She remains toxic with white count 17,400, still with some confusion and generalized weakness. There may be a component of aspiration as she has weak cough, can't clear secretions and chest x-ray this evening shows "Multifocal patchy opacities, right lower lobe predominant, suspicious for multifocal pneumonia". Will discontinue cefazolin, start Zosyn and continue Tamiflu(30 mg twice a day mindful of renal insufficiency as patient still toxic). She also developed A. fib with RVR likely related to ongoing sepsis. She is on amiodarone/beta blocker and off anticoagulation -fall risk elevated. Patient will need short-term rehabilitation once clinically stable. She does not engage in meaningful conversation today has she remains lethargic. Plan Sepsis/Respiratory failure, acute/Community acquired pneumonia/Streptococcus pneumoniae/Influenza with pneumonia  Discontinue cefazolin and start Zosyn adjusted for creatinine  clearance  Tamiflu 30 mg twice a day HTN (hypertension)/Hypokalemia/Chronic systolic heart failure  Continue current medications  Off anticoagulation due to increased fall risk  Replenish potassium as necessary Protein-calorie malnutrition, severe  Supportive care Code Status: DNR Family Communication: daughter at bedside Disposition Plan: Will need short-term rehabilitation placement   Consultants:  Pulmonary and critical care  Procedures:  Ventilator support  Antibiotics: Abx: Vanc, start date 3/9, day 2/x-->d/c 3/11 Abx: Levaquin, start date 3/9, day 2/x-->d/c 3/11 Abx: Ceftriaxone, start date 3/9, dc 3/11 Avx: Tamiflu 3/9 >3/11 and 3/13 > Abx: Cefazolin 3/11 >> 06/15/14 Abx: Zosyn 06/15/14>>  HPI/Subjective: No specific complaints  Objective: Filed Vitals:   06/15/14 1400  BP: 144/70  Pulse: 80  Temp: 98.2 F (36.8 C)  Resp: 18    Intake/Output Summary (Last 24 hours) at 06/15/14 2052 Last data filed at 06/15/14 1915  Gross per 24 hour  Intake      0 ml  Output    550 ml  Net   -550 ml   Filed Weights   06/10/14 2140 06/11/14 0500 06/12/14 0400  Weight: 52.9 kg (116 lb 10 oz) 58.2 kg (128 lb 4.9 oz) 58.6 kg (129 lb 3 oz)    Exam:   General:  Lethargic. Left IJ line in place  Cardiovascular: S1-S2 normal. No murmurs. Pulse regular.  Respiratory: Good air entry bilaterally. Bilateral rhonchi. Weak cough  Abdomen: Soft and nontender. Normal bowel sounds. No organomegaly.  Musculoskeletal: No pedal edema   Neurological: Lethargic but moving all extremities  Data Reviewed: Basic Metabolic Panel:  Recent Labs Lab 06/11/14 0510 06/12/14 0400 06/13/14 0930 06/14/14 0345 06/14/14 0425 06/15/14 0525  NA 140 142 144 143  --  144  K 4.2 4.2 3.0* 3.2*  --  4.0  CL 113* 116* 114* 115*  --  113*  CO2 18* 18* 20 21  --  23  GLUCOSE 107* 70 79 88  --  83  BUN 33* 37* 32* 29*  --  32*  CREATININE 1.45* 1.40* 1.14* 1.08  --  0.88  CALCIUM  7.4* 7.6* 8.2* 8.2*  --  8.6  MG 1.6  --   --   --  1.6 2.4  PHOS 3.5  --   --   --   --  3.1   Liver Function Tests:  Recent Labs Lab 06/10/14 1540 06/11/14 0510 06/13/14 0930 06/15/14 0525  AST 57* 71* 47* 69*  ALT 16 15 18 21   ALKPHOS 45 27* 49 84  BILITOT 1.3* 0.8 0.6 0.9  PROT 8.4* 6.6 6.0 7.0  ALBUMIN 3.6 2.5* 2.2* 2.5*   No results for input(s): LIPASE, AMYLASE in the last 168 hours. No results for input(s): AMMONIA in the last 168 hours. CBC:  Recent Labs Lab 06/10/14 1540 06/11/14 0510 06/12/14 0400 06/13/14 0525 06/14/14 0345 06/15/14 0525  WBC 1.6* 1.8* 7.9 11.5* 15.6* 17.4*  NEUTROABS 1.2* 1.4* 7.2  --   --   --   HGB 14.2 11.6* 11.8* 9.1* 10.6* 10.6*  HCT 42.8 35.4* 35.4* 26.1* 31.0* 31.9*  MCV 104.9* 103.5* 103.2* 100.0 99.4 101.3*  PLT 210 183 193 173 228 253   Cardiac Enzymes:  Recent Labs Lab 06/13/14 0930  TROPONINI 0.42*   BNP (last 3 results) No results for input(s): BNP in the last 8760 hours.  ProBNP (last 3 results)  Recent Labs  01/08/14 1401 01/09/14 0040  PROBNP 18570.0* 17819.0*    CBG:  Recent Labs Lab 06/12/14 0402 06/12/14 0440 06/12/14 0813 06/12/14 1224 06/12/14 1619  GLUCAP 69* 103* 78 131* 95    Recent Results (from the past 240 hour(s))  Culture, blood (routine x 2)     Status: None (Preliminary result)   Collection Time: 06/10/14  3:40 PM  Result Value Ref Range Status   Specimen Description BLOOD L FOREARM  Final   Special Requests BOTTLES DRAWN AEROBIC AND ANAEROBIC 2.5ML  Final   Culture   Final           BLOOD CULTURE RECEIVED NO GROWTH TO DATE CULTURE WILL BE HELD FOR 5 DAYS BEFORE ISSUING A FINAL NEGATIVE REPORT [facility]formed at [facility]    Report Status PENDING  Incomplete  Culture, blood (routine x 2)     Status: None   Collection Time: 06/10/14  3:50 PM  Result Value Ref Range Status   Specimen Description BLOOD R FOREARM  Final   Special Requests BOTTLES DRAWN AEROBIC AND  ANAEROBIC 5 ML  Final   Culture   Final    GROUP A STREP (S.PYOGENES) ISOLATED Note: CRITICAL RESULT CALLED TO, READ BACK BY AND VERIFIED WITH: MEGAN SHIFLETT @ 1059 ON [id number] BY [facility] FA[facility]ED TO [place] ATTN CONNIE WEANT [phone] BY MARHE Note: Gram Stain Report Called to,Read Back By and Verified With: MEGAN SCHOETT 06/11/14 0950 BY SMITHERSJ Performed at [facility]    Report Status 06/12/2014 FINAL  Final  Urine culture     Status: None   Collection Time: 06/10/14  4:44 PM  Result Value Ref Range Status   Specimen Description URINE, CATHETERIZED  Final   Special Requests NONE  Final   Colony Count NO GROWTH Performed at [facility]   Final   Culture NO GROWTH Performed at [facility]   Final   Report Status 06/12/2014  FINAL  Final  Culture, respiratory (NON-Expectorated)     Status: None   Collection Time: 06/10/14 10:00 PM  Result Value Ref Range Status   Specimen Description TRACHEAL ASPIRATE  Final   Special Requests NONE  Final   Gram Stain   Final    RARE WBC PRESENT, PREDOMINANTLY MONONUCLEAR NO SQUAMOUS EPITHELIAL CELLS SEEN NO ORGANISMS SEEN Performed at [facility]    Culture   Final    FEW GROUP A STREP (S.PYOGENES) ISOLATED Performed at [facility]    Report Status 06/12/2014 FINAL  Final  MRSA PCR Screening     Status: None   Collection Time: 06/10/14 10:44 PM  Result Value Ref Range Status   MRSA by PCR NEGATIVE NEGATIVE Final    Comment:        The GeneXpert MRSA Assay (FDA approved for NASAL specimens only), is one component of a comprehensive MRSA colonization surveillance program. It is not intended to diagnose MRSA infection nor to guide or monitor treatment for MRSA infections. Performed at [facility]   Respiratory virus panel     Status: Abnormal   Collection Time: 06/11/14 10:32 AM  Result Value Ref Range Status   Respiratory Syncytial Virus A Negative Negative Final   Respiratory  Syncytial Virus B Negative Negative Final   Influenza A Positive (A) Negative Final    Comment: Positive for Influenza A 2009 H1N1   Influenza B Negative Negative Final   Parainfluenza 1 Negative Negative Final   Parainfluenza 2 Negative Negative Final   Parainfluenza 3 Negative Negative Final   Metapneumovirus Negative Negative Final   Rhinovirus Negative Negative Final   Adenovirus Negative Negative Final    Comment: (NOTE) Performed At: [facility] [address] [place], [place] [id number] Mila Homer MD WU:[id number]      Studies: Dg Chest Port 1 View  06/15/2014   CLINICAL DATA:  Cough, congestion, weakness  EXAM: PORTABLE CHEST - 1 VIEW  COMPARISON:  06/13/2014  FINDINGS: Patchy opacities in the right upper and lower lobes, suspicious for pneumonia.  Additional patchy opacities in the left lower lobe, atelectasis versus pneumonia.  Possible small right pleural effusion.  Cardiomegaly.  Left IJ venous catheter terminates in the mid SVC.  IMPRESSION: Multifocal patchy opacities, right lower lobe predominant, suspicious for multifocal pneumonia.   Electronically Signed   By: Charline Bills M.D.   On: 06/15/2014 19:54    Scheduled Meds: . amiodarone  200 mg Oral Daily  . aspirin  325 mg Per Tube Daily  . bisoprolol  2.5 mg Oral Daily  . diphenhydrAMINE  12.5 mg Intravenous Once  . enoxaparin (LOVENOX) injection  30 mg Subcutaneous Q24H  . feeding supplement (RESOURCE BREEZE)  1 Container Oral TID BM  . furosemide  20 mg Oral Daily  . oseltamivir  30 mg Oral BID  . [START ON 06/16/2014] piperacillin-tazobactam (ZOSYN)  IV  3.375 g Intravenous Q8H  . piperacillin-tazobactam  3.375 g Intravenous Once  . sodium chloride  10-40 mL Intracatheter Q12H   Continuous Infusions:    Time spent: 25 minutes    Rickiya Picariello  [facility] Pager [phone]. If 7PM-7AM, please contact night-coverage at [email or web address], password [facility] 06/15/2014, 8:52 PM  LOS: 5 days

## 2014-06-15 NOTE — Progress Notes (Signed)
ANTIBIOTIC CONSULT NOTE - follow-up  Pharmacy Consult for cefazolin Indication: Group A streptococcus bacteremia  No Known Allergies  Patient Measurements: Height: 5\' 2"  (157.5 cm) Weight: 129 lb 3 oz (58.6 kg) IBW/kg (Calculated) : 50.1 Adjusted Body Weight: n/a  Vital Signs: Temp: 98 F (36.7 C) (03/14 0544) Temp Source: Oral (03/14 0544) BP: 138/66 mmHg (03/14 0544) Pulse Rate: 89 (03/14 0544) Intake/Output from previous day: 03/13 0701 - 03/14 0700 In: -  Out: 301 [Urine:300; Stool:1] Intake/Output from this shift: Total I/O In: 360 [P.O.:360] Out: -   Labs:  Recent Labs  06/13/14 0525 06/13/14 0930 06/14/14 0345 06/15/14 0525  WBC 11.5*  --  15.6* 17.4*  HGB 9.1*  --  10.6* 10.6*  PLT 173  --  228 253  CREATININE  --  1.14* 1.08 0.88   Estimated Creatinine Clearance: 35.6 mL/min (by C-G formula based on Cr of 0.88). No results for input(s): VANCOTROUGH, VANCOPEAK, VANCORANDOM, GENTTROUGH, GENTPEAK, GENTRANDOM, TOBRATROUGH, TOBRAPEAK, TOBRARND, AMIKACINPEAK, AMIKACINTROU, AMIKACIN in the last 72 hours.   Microbiology: Recent Results (from the past 720 hour(s))  Culture, blood (routine x 2)     Status: None (Preliminary result)   Collection Time: 06/10/14  3:40 PM  Result Value Ref Range Status   Specimen Description BLOOD L FOREARM  Final   Special Requests BOTTLES DRAWN AEROBIC AND ANAEROBIC 2.5ML  Final   Culture   Final           BLOOD CULTURE RECEIVED NO GROWTH TO DATE CULTURE WILL BE HELD FOR 5 DAYS BEFORE ISSUING A FINAL NEGATIVE REPORT Performed at [facility]    Report Status PENDING  Incomplete  Culture, blood (routine x 2)     Status: None   Collection Time: 06/10/14  3:50 PM  Result Value Ref Range Status   Specimen Description BLOOD R FOREARM  Final   Special Requests BOTTLES DRAWN AEROBIC AND ANAEROBIC 5 ML  Final   Culture   Final    GROUP A STREP (S.PYOGENES) ISOLATED Note: CRITICAL RESULT CALLED TO, READ BACK BY AND VERIFIED  WITH: MEGAN SHIFLETT @ 1059 ON [id number] BY [facility] FAXED TO [facility] ATTN CONNIE WEANT [id number] BY MARHE Note: Gram Stain Report Called to,Read Back By and Verified With: MEGAN SCHOETT 06/11/14 0950 BY SMITHERSJ Performed at [facility]    Report Status 06/12/2014 FINAL  Final  Urine culture     Status: None   Collection Time: 06/10/14  4:44 PM  Result Value Ref Range Status   Specimen Description URINE, CATHETERIZED  Final   Special Requests NONE  Final   Colony Count NO GROWTH Performed at [facility]   Final   Culture NO GROWTH Performed at [facility]   Final   Report Status 06/12/2014 FINAL  Final  Culture, respiratory (NON-Expectorated)     Status: None   Collection Time: 06/10/14 10:00 PM  Result Value Ref Range Status   Specimen Description TRACHEAL ASPIRATE  Final   Special Requests NONE  Final   Gram Stain   Final    RARE WBC PRESENT, PREDOMINANTLY MONONUCLEAR NO SQUAMOUS EPITHELIAL CELLS SEEN NO ORGANISMS SEEN Performed at [facility]    Culture   Final    FEW GROUP A STREP (S.PYOGENES) ISOLATED Performed at [facility]    Report Status 06/12/2014 FINAL  Final  MRSA PCR Screening     Status: None   Collection Time: 06/10/14 10:44 PM  Result Value Ref Range  Status   MRSA by PCR NEGATIVE NEGATIVE Final    Comment:        The GeneXpert MRSA Assay (FDA approved for NASAL specimens only), is one component of a comprehensive MRSA colonization surveillance program. It is not intended to diagnose MRSA infection nor to guide or monitor treatment for MRSA infections. Performed at [facility]   Respiratory virus panel     Status: Abnormal   Collection Time: 06/11/14 10:32 AM  Result Value Ref Range Status   Respiratory Syncytial Virus A Negative Negative Final   Respiratory Syncytial Virus B Negative Negative Final   Influenza A Positive (A) Negative Final    Comment: Positive for Influenza A 2009 H1N1    Influenza B Negative Negative Final   Parainfluenza 1 Negative Negative Final   Parainfluenza 2 Negative Negative Final   Parainfluenza 3 Negative Negative Final   Metapneumovirus Negative Negative Final   Rhinovirus Negative Negative Final   Adenovirus Negative Negative Final    Comment: (NOTE) Performed At: [facility] [address] [place], [place] [id number] Lindon Romp MD IN:[id number]     Medical History: Past Medical History  Diagnosis Date  . Hypertension   . CHF (congestive heart failure)   . Hyperlipidemia   . Heart murmur   . Shortness of breath   . GERD (gastroesophageal reflux disease)     Medications:  Scheduled:  . amiodarone  200 mg Oral Daily  . aspirin  325 mg Per Tube Daily  . bisoprolol  2.5 mg Oral Daily  .  ceFAZolin (ANCEF) IV  1 g Intravenous Q12H  . diphenhydrAMINE  12.5 mg Intravenous Once  . enoxaparin (LOVENOX) injection  30 mg Subcutaneous Q24H  . furosemide  20 mg Oral Daily  . oseltamivir  30 mg Oral BID  . sodium chloride  10-40 mL Intracatheter Q12H   Infusions:     Assessment: 89yoF presenting with AMS and fever. Endorses cough for last few days, no vomiting. Pharmacy started abx for sepsis. PMH CHF, HTN Patient met criteria for sepsis with fever, tachypnea, and WBC < 4k. Code sepsis called at 1600; first doses of antibiotics given in ED.  To continue with cefazolin for now for group A strep bacteremia.  3/9 >> ceftriaxone >> 3/11 3/9 >> vancomycin >> 3/11 3/9 >> Zosyn >> 3/9 3/9 >> levaquin >> 3/11 3/9 >> Tamiflu (dose reduced for renal dysfx) >> 3/11 3/13 >> Tamiflu resumed, dose reduced >> 3/11 >> cefazolin >>  Temp: afeb WBC: trending up 17.4 Renal: scr trending down 0.88 (crcl~36) LA 2.47 PCT: > 175, > 175  3/9 blood: 1 of 2 group A strep 3/9 urine: NGF (UA w/ neg leukocytes but many bacteria) FINAL 3/9 strep/legionella UAg: neg/neg 3/9 trach asp: few group A strep FINAL 3/10 Resp virus panel:  positive Influenza A  Goal of Therapy:   Eradication of infection  Appropriate antibiotic dosing for indication and renal function  Plan:   Adjust cefazolin to 1gm IV q8h for renal function  Follow clinical course, culture results as available, renal function  Dia Sitter, PharmD, BCPS

## 2014-06-15 NOTE — Progress Notes (Addendum)
Clinical Social Work Department CLINICAL SOCIAL WORK PLACEMENT NOTE 06/15/2014  Patient:  Creed CopperJACKSON,Alaiyah G  Account Number:  [id number] Admit date:  06/10/2014  Clinical Social Worker:  Unk LightningHOLLY GERBER, LCSW  Date/time:  06/15/2014 02:30 PM  Clinical Social Work is seeking post-discharge placement for this patient at the following level of care:   SKILLED NURSING   (*CSW will update this form in Epic as items are completed)   06/15/2014  [facility]/family provided with [facility] of Clinical Social Work's list of facilities offering this level of care within the geographic area requested by the patient (or if unable, by the patient's family).  06/15/2014  Patient/family informed of their freedom to choose among providers that offer the needed level of care, that participate in Medicare, Medicaid or managed care program needed by the patient, have an available bed and are willing to accept the patient.  06/15/2014  Patient/family informed of [facility]' ownership interest in [facility], as well as of the fact that they are under no obligation to receive care at this facility.  PASARR submitted to EDS on existing # PASARR number received on   FL2 transmitted to all facilities in geographic area requested by pt/family on  06/15/2014 FL2 transmitted to all facilities within larger geographic area on   Patient informed that his/her managed care company has contracts with or will negotiate with  certain facilities, including the following:     Patient/family informed of bed offers received:  06/18/14  [facility] recommends and patient chooses bed at    [facility]  on  06/18/14 Patient to be transferred to facility by  Patient and family notified of transfer on 06/18/14 Name of family member notified:  Sedalia Mutaiane and  Joni ReiningNicole   The following physician request were entered in Epic:   Additional  Comments:   Reece LevyJanet Edell Mesenbrink, MSW, Theresia MajorsLCSWA [phone]

## 2014-06-15 NOTE — Progress Notes (Signed)
NUTRITION FOLLOW UP  Pt meets criteria for severe malnutrition in the context of chronic illness as evidenced by severe muscle mass and subcutaneous body fat depletion.  Intervention:   - Resource Breeze po TID, each supplement provides 250 kcal and 9 grams of protein  - RD will continue to monitor  Nutrition Dx:   Inadequate oral intake related to inability to eat as evidenced by NPO status; ongoing   Goal:   Pt to meet >/= 90% of their estimated nutrition needs; not met  Monitor:   Wt trends, po intake, labs  Assessment:   81 y/o female from home brought to [facility] ED for worsening confusion and cough. In ED, she was found to have right sided PNA, febrile 103. Initially placed on BiPAP; however, she remained hypoxic and was intubated.   - Pt extubated 3/11. - PO intake recorded as 25%. Per daughter, pt is not eating much at all. Pt said that she is not hungry. Daughter asked RD to order jello and chicken broth for pt's dinner. Not drinking Ensure supplements. Did not want ice cream because it may cause build-up of mucus in throat.  - Agreed to try [employer]. RD to order.  - Labs reviewed- K low, BUN elevated  Height: Ht Readings from Last 1 Encounters:  06/10/14 _0  (1.575 m)    Weight Status:   Wt Readings from Last 1 Encounters:  06/12/14 129 lb 3 oz (58.6 kg)    Re-estimated needs:  Kcal: 1450-1650 Protein: 85-100 g Fluid: >1.5 L/day  Skin: intact  Diet Order: DIET DYS 2   Intake/Output Summary (Last 24 hours) at 06/15/14 1516 Last data filed at 06/15/14 1203  Gross per 24 hour  Intake      0 ml  Output    300 ml  Net   -300 ml    Last BM: 3/13   Labs:   Recent Labs Lab 06/11/14 0510  06/13/14 0930 06/14/14 0345 06/14/14 0425 06/15/14 0525  NA 140  < > 144 143  --  144  K 4.2  < > 3.0* 3.2*  --  4.0  CL 113*  < > 114* 115*  --  113*  CO2 18*  < > 20 21  --  23  BUN 33*  < > 32* 29*  --  32*  CREATININE 1.45*  < > 1.14* 1.08  --  0.88   CALCIUM 7.4*  < > 8.2* 8.2*  --  8.6  MG 1.6  --   --   --  1.6 2.4  PHOS 3.5  --   --   --   --  3.1  GLUCOSE 107*  < > 79 88  --  83  < > = values in this interval not displayed.  CBG (last 3)   Recent Labs  06/12/14 1619  GLUCAP 95    Scheduled Meds: . amiodarone  200 mg Oral Daily  . aspirin  325 mg Per Tube Daily  . bisoprolol  2.5 mg Oral Daily  .  ceFAZolin (ANCEF) IV  1 g Intravenous Q8H  . diphenhydrAMINE  12.5 mg Intravenous Once  . enoxaparin (LOVENOX) injection  30 mg Subcutaneous Q24H  . furosemide  20 mg Oral Daily  . oseltamivir  30 mg Oral BID  . sodium chloride  10-40 mL Intracatheter Q12H    Continuous Infusions:    Laurette Schimke Taney, [place], LDN [phone]

## 2014-06-16 LAB — CBC WITH DIFFERENTIAL/PLATELET
Basophils Absolute: 0 10*3/uL (ref 0.0–0.1)
Basophils Relative: 0 % (ref 0–1)
Eosinophils Absolute: 0.1 10*3/uL (ref 0.0–0.7)
Eosinophils Relative: 1 % (ref 0–5)
HCT: 32.4 % — ABNORMAL LOW (ref 36.0–46.0)
HEMOGLOBIN: 10.4 g/dL — AB (ref 12.0–15.0)
LYMPHS ABS: 0.7 10*3/uL (ref 0.7–4.0)
LYMPHS PCT: 6 % — AB (ref 12–46)
MCH: 32.9 pg (ref 26.0–34.0)
MCHC: 32.1 g/dL (ref 30.0–36.0)
MCV: 102.5 fL — ABNORMAL HIGH (ref 78.0–100.0)
MONO ABS: 0.5 10*3/uL (ref 0.1–1.0)
MONOS PCT: 4 % (ref 3–12)
NEUTROS ABS: 11.2 10*3/uL — AB (ref 1.7–7.7)
NEUTROS PCT: 90 % — AB (ref 43–77)
PLATELETS: 275 10*3/uL (ref 150–400)
RBC: 3.16 MIL/uL — AB (ref 3.87–5.11)
RDW: 14.7 % (ref 11.5–15.5)
WBC: 12.4 10*3/uL — ABNORMAL HIGH (ref 4.0–10.5)

## 2014-06-16 LAB — CULTURE, BLOOD (ROUTINE X 2): Culture: NO GROWTH

## 2014-06-16 LAB — MAGNESIUM: Magnesium: 1.6 mg/dL (ref 1.5–2.5)

## 2014-06-16 MED ORDER — POTASSIUM CHLORIDE 10 MEQ/100ML IV SOLN
10.0000 meq | INTRAVENOUS | Status: AC
  Administered 2014-06-16 (×4): 10 meq via INTRAVENOUS
  Filled 2014-06-16 (×4): qty 100

## 2014-06-16 NOTE — Progress Notes (Signed)
Clinical Social Work  CSW spoke with dtr Graciella Belton(Dianne) who reports that they have reviewed list and want [facility] again. CSW explained [facility] was able to offer a bed. CSW spoke with SNF who reports available space and can accept patient when stable. CSW updated Lat[facility]Sandra [facility] who will provide insurance authorization when medically stable.  UlmHolly Hadassa Cermak, [place] [phone]

## 2014-06-16 NOTE — Progress Notes (Signed)
Physical Therapy Treatment Patient Details Name: Traci Hale MRN: [id number] DOB: Aug 25, 1924 Today's Date: 06/16/2014    History of Present Illness Pt is an 81 year old female admitted for Acute hypoxic respiratory failure likely due to Severe CAP with OETT 3/9-3/11 and septic and cardiogenic shock.    PT Comments    Pt able to A more with supine > sit transfer today, but very fatigued and low activity tolerance.  Con't to recommend SNF for rehab.  Follow Up Recommendations  SNF     Equipment Recommendations  Wheelchair (measurements PT);Hospital bed    Recommendations for Other Services       Precautions / Restrictions Precautions Precautions: Fall    Mobility  Bed Mobility Overal bed mobility: Needs Assistance;+2 for physical assistance Bed Mobility: Supine to Sit;Sit to Supine     Supine to sit: +2 for physical assistance;Mod assist Sit to supine: Total assist;+2 for physical assistance   General bed mobility comments: Pt was able to move legs to side of bed and reach over to rail to attempt to A with transfer.  Transfers Overall transfer level: Needs assistance   Transfers: Sit to/from Stand Sit to Stand: +2 physical assistance;Max assist         General transfer comment: Stood with MAX of 2 to attempt to take a few side steps to [facility]. Able to move L LE with A given to weight shift.  A given to R LE to move to side. pt with felxed posture and bent knees.  Ambulation/Gait                 Stairs            Wheelchair Mobility    Modified Rankin (Stroke Patients Only)       Balance Overall balance assessment: Needs assistance Sitting-balance support: Bilateral upper extremity supported;Feet supported Sitting balance-Leahy Scale: Poor Sitting balance - Comments: Pt able to sit up at times with min/guard and other times with increased assistance, but feel this was more of wanting to return to bed and was fatigued.   Standing balance  support: Bilateral upper extremity supported Standing balance-Leahy Scale: Zero                      Cognition Arousal/Alertness: Awake/alert Behavior During Therapy: Flat affect Overall Cognitive Status: Within Functional Limits for tasks assessed         Following Commands: Follows one step commands with increased time       General Comments: Daughter present and states pt had just finally fallen asleep. She was awake enough to participate, but very fatigued.    Exercises      General Comments General comments (skin integrity, edema, etc.): Pt c/o some dizziness at EOB, but primary c/o was of fatigue.  Daughter educated on positioning with pillows and for HOB upright for lungs.      Pertinent Vitals/Pain Pain Assessment: No/denies pain    Home Living                      Prior Function            PT Goals (current goals can now be found in the care plan section) Acute Rehab PT Goals PT Goal Formulation: With patient/family Time For Goal Achievement: 06/28/14 Potential to Achieve Goals: Fair Progress towards PT goals: Progressing toward goals    Frequency  Min 3X/week    PT Plan Current plan remains appropriate  Co-evaluation             End of Session   Activity Tolerance: Patient limited by fatigue Patient left: in bed;with call bell/phone within reach;with family/visitor present     Time: [id number] PT Time Calculation (min) (ACUTE ONLY): 14 min  Charges:  $Therapeutic Activity: 8-22 mins                    G Codes:      Nautika Cressey LUBECK 06/16/2014, 12:37 PM

## 2014-06-16 NOTE — Progress Notes (Signed)
[facility] PROGRESS NOTE  Traci Hale ZOX:[id number] DOB: 04-Oct-1924 DOA: 06/10/2014 PCP: Cain Saupe, MD  Summary I have seen and examined Traci Hale at bedside in the presence of her daughter and reviewed her chart. Traci Hale is a pleasant 81 year old female with essential hypertension, chronic systolic CHF EF 35-40%, among other medical problems, who camefrom home to [facility] ED on 3/9 for worsening confusion, and cough and she was found to be septic likely due to a combination of strep pneumonia/influenza. In ED Chest x-ray suggested right sided PNA, and patient was febrile to 103F.She was initially placed on BiPAP; however, remained hypoxic despite PEEP 10 hencedecision made to intubate for short term support, mindful of DNRcode status. She was successfully extubated on 06/12/2014. Respiratory culture was positive for strep pneumonia. Patient also has Influenza type A. She was initially placed on broad-spectrum antibiotics and Tamiflu. Antibiotics were narrowed to Cefazolin. She remained toxic with white count 17,400, still with some confusion and generalized weakness on 06/15/14 raising concern that there might be a component of aspiration as she had weak cough, couldn't clear secretions and chest x-ray showed "Multifocal patchy opacities, right lower lobe predominant, suspicious for multifocal pneumonia". Cefazolin was therefore discontinued and patient was started on Zosyn and she was continued on Tamiflu(30 mg twice a day mindful of renal insufficiency as patient still toxic). This combination seems to be doing the trach as her white count has improved to 12,400 and she is no longer toxic. During this hospital stay, patient also developed A. fib with RVR likely related to ongoing sepsis. She is on amiodarone/beta blocker and off anticoagulation -fall risk elevated. Patient will need short-term rehabilitation once clinically stable. Would continue Zosyn for 1 or 2 more days then  transition to Augmentin and continue Tamiflu to complete course. Plan Sepsis/Respiratory failure, acute/Community acquired pneumonia/Streptococcus pneumoniae/Influenza with pneumonia  Continue Zosyn adjusted for creatinine clearance  Tamiflu 30 mg twice a day to complete 5 day course HTN (hypertension)/Hypokalemia/Chronic systolic heart failure  Continue current medications  Off anticoagulation due to increased fall risk  Replenish potassium as necessary Protein-calorie malnutrition, severe  Supportive care Code Status: DNR Family Communication: daughter at bedside Disposition Plan: Will need short-term rehabilitation placement   Consultants:  Pulmonary and critical care  Procedures:  Ventilator support  Antibiotics: Abx: Vanc, start date 3/9, day 2/x-->d/c 3/11 Abx: Levaquin, start date 3/9, day 2/x-->d/c 3/11 Abx: Ceftriaxone, start date 3/9, dc 3/11 Avx: Tamiflu 3/9 >3/11 and 3/13 > Abx: Cefazolin 3/11 >> 06/15/14 Abx: Zosyn 06/15/14>>  HPI/Subjective: No specific complaints.  Objective: Filed Vitals:   06/16/14 2200  BP: 144/59  Pulse: 68  Temp: 97.5 F (36.4 C)  Resp: 18    Intake/Output Summary (Last 24 hours) at 06/16/14 2352 Last data filed at 06/16/14 1437  Gross per 24 hour  Intake    600 ml  Output    950 ml  Net   -350 ml   Filed Weights   06/10/14 2140 06/11/14 0500 06/12/14 0400  Weight: 52.9 kg (116 lb 10 oz) 58.2 kg (128 lb 4.9 oz) 58.6 kg (129 lb 3 oz)    Exam:   General:  Comfortable at rest. Left IJ central line.  Cardiovascular: S1-S2 normal. No murmurs. Pulse regular.  Respiratory: Good air entry bilaterally. No rhonchi or rales.  Abdomen: Soft and nontender. Normal bowel sounds. No organomegaly.  Musculoskeletal: No pedal edema   Neurological: Intact  Data Reviewed: Basic Metabolic Panel:  Recent Labs Lab 06/11/14  0510 06/12/14 0400 06/13/14 0930 06/14/14 0345 06/14/14 0425 06/15/14 0525 06/16/14 1638  06/16/14 1659  NA 140 142 144 143  --  144 142  --   K 4.2 4.2 3.0* 3.2*  --  4.0 2.6*  --   CL 113* 116* 114* 115*  --  113* 112  --   CO2 18* 18* 20 21  --  23 26  --   GLUCOSE 107* 70 79 88  --  83 172*  --   BUN 33* 37* 32* 29*  --  32* 26*  --   CREATININE 1.45* 1.40* 1.14* 1.08  --  0.88 0.87  --   CALCIUM 7.4* 7.6* 8.2* 8.2*  --  8.6 7.9*  --   MG 1.6  --   --   --  1.6 2.4  --  1.6  PHOS 3.5  --   --   --   --  3.1  --   --    Liver Function Tests:  Recent Labs Lab 06/10/14 1540 06/11/14 0510 06/13/14 0930 06/15/14 0525 06/16/14 1638  AST 57* 71* 47* 69* 72*  ALT 16 15 18 21 22   ALKPHOS 45 27* 49 84 66  BILITOT 1.3* 0.8 0.6 0.9 0.4  PROT 8.4* 6.6 6.0 7.0 6.5  ALBUMIN 3.6 2.5* 2.2* 2.5* 2.0*   No results for input(s): LIPASE, AMYLASE in the last 168 hours. No results for input(s): AMMONIA in the last 168 hours. CBC:  Recent Labs Lab 06/10/14 1540 06/11/14 0510 06/12/14 0400 06/13/14 0525 06/14/14 0345 06/15/14 0525 06/16/14 1638  WBC 1.6* 1.8* 7.9 11.5* 15.6* 17.4* 12.4*  NEUTROABS 1.2* 1.4* 7.2  --   --   --  11.2*  HGB 14.2 11.6* 11.8* 9.1* 10.6* 10.6* 10.4*  HCT 42.8 35.4* 35.4* 26.1* 31.0* 31.9* 32.4*  MCV 104.9* 103.5* 103.2* 100.0 99.4 101.3* 102.5*  PLT 210 183 193 173 228 253 275   Cardiac Enzymes:  Recent Labs Lab 06/13/14 0930  TROPONINI 0.42*   BNP (last 3 results) No results for input(s): BNP in the last 8760 hours.  ProBNP (last 3 results)  Recent Labs  01/08/14 1401 01/09/14 0040  PROBNP 18570.0* 17819.0*    CBG:  Recent Labs Lab 06/12/14 0402 06/12/14 0440 06/12/14 0813 06/12/14 1224 06/12/14 1619  GLUCAP 69* 103* 78 131* 95    Recent Results (from the past 240 hour(s))  Culture, blood (routine x 2)     Status: None   Collection Time: 06/10/14  3:40 PM  Result Value Ref Range Status   Specimen Description BLOOD L FOREARM  Final   Special Requests BOTTLES DRAWN AEROBIC AND ANAEROBIC 2.5ML  Final   Culture    Final    NO GROWTH 5 DAYS [facility]    Report Status 06/16/2014 FINAL  Final  Culture, blood (routine x 2)     Status: None   Collection Time: 06/10/14  3:50 PM  Result Value Ref Range Status   Specimen Description BLOOD R FOREARM  Final   Special Requests BOTTLES DRAWN AEROBIC AND ANAEROBIC 5 ML  Final   Culture   Final    GROUP A STREP (S.PYOGENES) ISOLATED Note: CRITICAL RESULT CALLED TO, READ BACK BY AND VERIFIED WITH: MEGAN SHIFLETT @ 1059 ON [id number] BY [facility] FAXED TO [place] ATTN CONNIE WEANT [phone] BY MARHE Note: Gram Stain Report Called to,Read Back By and Verified With: MEGAN SCHOETT 06/11/14 0950 BY SMITHERSJ Performed at [facility]    Report Status 06/12/2014 FINAL  Final  Urine culture     Status: None   Collection Time: 06/10/14  4:44 PM  Result Value Ref Range Status   Specimen Description URINE, CATHETERIZED  Final   Special Requests NONE  Final   Colony Count NO GROWTH Performed at [facility]   Final   Culture NO GROWTH Performed at [facility]   Final   Report Status 06/12/2014 FINAL  Final  Culture, respiratory (NON-Expectorated)     Status: None   Collection Time: 06/10/14 10:00 PM  Result Value Ref Range Status   Specimen Description TRACHEAL ASPIRATE  Final   Special Requests NONE  Final   Gram Stain   Final    RARE WBC PRESENT, PREDOMINANTLY MONONUCLEAR NO SQUAMOUS EPITHELIAL CELLS SEEN NO ORGANISMS SEEN Performed at [facility]    Culture   Final    FEW GROUP A STREP (S.PYOGENES) ISOLATED Performed at [facility]    Report Status 06/12/2014 FINAL  Final  MRSA PCR Screening     Status: None   Collection Time: 06/10/14 10:44 PM  Result Value Ref Range Status   MRSA by PCR NEGATIVE NEGATIVE Final    Comment:        The GeneXpert MRSA Assay (FDA approved for NASAL specimens only), is one component of a comprehensive MRSA colonization surveillance program. It is  not intended to diagnose MRSA infection nor to guide or monitor treatment for MRSA infections. Performed at [facility]   Respiratory virus panel     Status: Abnormal   Collection Time: 06/11/14 10:32 AM  Result Value Ref Range Status   Respiratory Syncytial Virus A Negative Negative Final   Respiratory Syncytial Virus B Negative Negative Final   Influenza A Positive (A) Negative Final    Comment: Positive for Influenza A 2009 H1N1   Influenza B Negative Negative Final   Parainfluenza 1 Negative Negative Final   Parainfluenza 2 Negative Negative Final   Parainfluenza 3 Negative Negative Final   Metapneumovirus Negative Negative Final   Rhinovirus Negative Negative Final   Adenovirus Negative Negative Final    Comment: (NOTE) Performed At: [facility] [address] [place], [place] [id number] Mila Homer MD NF:[id number]      Studies: Dg Chest Port 1 View  06/15/2014   CLINICAL DATA:  Cough, congestion, weakness  EXAM: PORTABLE CHEST - 1 VIEW  COMPARISON:  06/13/2014  FINDINGS: Patchy opacities in the right upper and lower lobes, suspicious for pneumonia.  Additional patchy opacities in the left lower lobe, atelectasis versus pneumonia.  Possible small right pleural effusion.  Cardiomegaly.  Left IJ venous catheter terminates in the mid SVC.  IMPRESSION: Multifocal patchy opacities, right lower lobe predominant, suspicious for multifocal pneumonia.   Electronically Signed   By: Charline Bills M.D.   On: 06/15/2014 19:54    Scheduled Meds: . amiodarone  200 mg Oral Daily  . aspirin  325 mg Per Tube Daily  . bisoprolol  2.5 mg Oral Daily  . diphenhydrAMINE  12.5 mg Intravenous Once  . enoxaparin (LOVENOX) injection  30 mg Subcutaneous Q24H  . feeding supplement (RESOURCE BREEZE)  1 Container Oral TID BM  . furosemide  20 mg Oral Daily  . oseltamivir  30 mg Oral BID  . piperacillin-tazobactam (ZOSYN)  IV  3.375 g Intravenous Q8H  . sodium chloride   10-40 mL Intracatheter Q12H   Continuous Infusions:    Time  spent: 25 minutes    Nasif Bos  [facility] Pager [phone]. If 7PM-7AM, please contact night-coverage at [email or web address], password [facility] 06/16/2014, 11:52 PM  LOS: 6 days

## 2014-06-17 DIAGNOSIS — E43 Unspecified severe protein-calorie malnutrition: Secondary | ICD-10-CM

## 2014-06-17 DIAGNOSIS — J1100 Influenza due to unidentified influenza virus with unspecified type of pneumonia: Secondary | ICD-10-CM

## 2014-06-17 DIAGNOSIS — B953 Streptococcus pneumoniae as the cause of diseases classified elsewhere: Secondary | ICD-10-CM

## 2014-06-17 LAB — COMPREHENSIVE METABOLIC PANEL
ALBUMIN: 2 g/dL — AB (ref 3.5–5.2)
ALT: 22 U/L (ref 0–35)
ANION GAP: 4 — AB (ref 5–15)
AST: 72 U/L — ABNORMAL HIGH (ref 0–37)
Alkaline Phosphatase: 66 U/L (ref 39–117)
BUN: 26 mg/dL — AB (ref 6–23)
CO2: 26 mmol/L (ref 19–32)
CREATININE: 0.87 mg/dL (ref 0.50–1.10)
Calcium: 7.9 mg/dL — ABNORMAL LOW (ref 8.4–10.5)
Chloride: 112 mmol/L (ref 96–112)
GFR calc Af Amer: 67 mL/min — ABNORMAL LOW (ref >90)
GFR calc non Af Amer: 58 mL/min — ABNORMAL LOW (ref >90)
Glucose, Bld: 172 mg/dL — ABNORMAL HIGH (ref 70–99)
POTASSIUM: 2.6 mmol/L — AB (ref 3.5–5.1)
SODIUM: 142 mmol/L (ref 135–145)
TOTAL PROTEIN: 6.5 g/dL (ref 6.0–8.3)
Total Bilirubin: 0.4 mg/dL (ref 0.3–1.2)

## 2014-06-17 LAB — CBC
HCT: 31.3 % — ABNORMAL LOW (ref 36.0–46.0)
Hemoglobin: 10.7 g/dL — ABNORMAL LOW (ref 12.0–15.0)
MCH: 35.1 pg — ABNORMAL HIGH (ref 26.0–34.0)
MCHC: 34.2 g/dL (ref 30.0–36.0)
MCV: 102.6 fL — AB (ref 78.0–100.0)
PLATELETS: 274 10*3/uL (ref 150–400)
RBC: 3.05 MIL/uL — AB (ref 3.87–5.11)
RDW: 14.4 % (ref 11.5–15.5)
WBC: 13.1 10*3/uL — AB (ref 4.0–10.5)

## 2014-06-17 LAB — BASIC METABOLIC PANEL
Anion gap: 9 (ref 5–15)
BUN: 22 mg/dL (ref 6–23)
CO2: 26 mmol/L (ref 19–32)
Calcium: 8.1 mg/dL — ABNORMAL LOW (ref 8.4–10.5)
Chloride: 108 mmol/L (ref 96–112)
Creatinine, Ser: 0.85 mg/dL (ref 0.50–1.10)
GFR, EST AFRICAN AMERICAN: 69 mL/min — AB (ref >90)
GFR, EST NON AFRICAN AMERICAN: 60 mL/min — AB (ref >90)
Glucose, Bld: 95 mg/dL (ref 70–99)
Potassium: 3.1 mmol/L — ABNORMAL LOW (ref 3.5–5.1)
SODIUM: 143 mmol/L (ref 135–145)

## 2014-06-17 LAB — MAGNESIUM: MAGNESIUM: 1.6 mg/dL (ref 1.5–2.5)

## 2014-06-17 LAB — PHOSPHORUS: Phosphorus: 2.9 mg/dL (ref 2.3–4.6)

## 2014-06-17 MED ORDER — AMOXICILLIN-POT CLAVULANATE 500-125 MG PO TABS
1.0000 | ORAL_TABLET | Freq: Three times a day (TID) | ORAL | Status: DC
  Administered 2014-06-18: 500 mg via ORAL
  Filled 2014-06-17 (×3): qty 1

## 2014-06-17 MED ORDER — POTASSIUM CHLORIDE 10 MEQ/50ML IV SOLN
10.0000 meq | INTRAVENOUS | Status: AC
  Administered 2014-06-17 (×4): 10 meq via INTRAVENOUS
  Filled 2014-06-17 (×4): qty 50

## 2014-06-17 NOTE — Progress Notes (Signed)
[facility] PROGRESS NOTE  MARDY HOPPE ZOX:[id number] DOB: 1924/04/27 DOA: 06/10/2014 PCP: Cain Saupe, MD  Summary Traci Hale is a pleasant 81 year old female with essential hypertension, chronic systolic CHF EF 35-40%, among other medical problems, who [facility]from home [facility] ED on 3/9 for worsening confusion, and cough and she was found to be septic likely due to a combination of strep pneumonia/influenza. In ED Chest x-ray suggested right sided PNA, and patient was febrile to 103F.She was initially placed on BiPAP; however, remained hypoxic despite PEEP 10 hencedecision made to intubate for short term support, mindful of DNRcode status. She was successfully extubated on 06/12/2014. Respiratory culture was positive for strep pneumonia. Patient also has Influenza type A. She was initially placed on broad-spectrum antibiotics and Tamiflu. Antibiotics were narrowed to Cefazolin. She remained toxic with white count 17,400, still with some confusion and generalized weakness on 06/15/14 raising concern that there might be a component of aspiration as she had weak cough, couldn't clear secretions and chest x-ray showed "Multifocal patchy opacities, right lower lobe predominant, suspicious for multifocal pneumonia". Cefazolin was therefore discontinued and patient was started on Zosyn and she was continued on Tamiflu(30 mg twice a day mindful of renal insufficiency as patient still toxic). This combination seems to be doing the trach as her white count has improved to 12,400 and she is no longer toxic. During this hospital stay, patient also developed A. fib with RVR likely related to ongoing sepsis. She is on amiodarone/beta blocker and off anticoagulation -fall risk elevated.   Plan Sepsis/Respiratory failure, acute/Community acquired pneumonia/Streptococcus pneumoniae/Influenza with pneumonia =suspect patient may be aspirating- weak cough  Continue Zosyn through today- change to Augmentin  in AM  Tamiflu 30 mg twice a day to complete 5 day course -DYS diet  HTN (hypertension)/Hypokalemia/Chronic systolic heart failure  Continue current medications  Off anticoagulation due to increased fall risk  Replenish potassium as necessary  Protein-calorie malnutrition, severe  Supportive care  D/c foley D/c central line in AM  Hypokalemia -replete -Mg 1.6  Troponin elevated -mildly elevated -suspect demend -denies CP  1/2 blood cultures + for group A strep (sputum cx also group A strep)  Code Status: DNR Family Communication: daughter at bedside- spoke with her regarding her aspiration risk and likelihood of a recurrent PNA Disposition Plan: SNF in AM   Consultants:  Pulmonary and critical care  Procedures:  Ventilator support  Antibiotics: Abx: Vanc, start date 3/9, day 2/x-->d/c 3/11 Abx: Levaquin, start date 3/9, day 2/x-->d/c 3/11 Abx: Ceftriaxone, start date 3/9, dc 3/11 Avx: Tamiflu 3/9 >3/11 and 3/13 > Abx: Cefazolin 3/11 >> 06/15/14 Abx: Zosyn 06/15/14>>  HPI/Subjective: C/o leg pain- (per daughter has been going on since admission)  Objective: Filed Vitals:   06/17/14 0642  BP: 113/44  Pulse: 60  Temp: 97.3 F (36.3 C)  Resp: 18    Intake/Output Summary (Last 24 hours) at 06/17/14 0911 Last data filed at 06/17/14 0345  Gross per 24 hour  Intake    620 ml  Output    450 ml  Net    170 ml   Filed Weights   06/10/14 2140 06/11/14 0500 06/12/14 0400  Weight: 52.9 kg (116 lb 10 oz) 58.2 kg (128 lb 4.9 oz) 58.6 kg (129 lb 3 oz)    Exam:   General:  Comfortable at rest. Left IJ central line- weak sounding cough  Cardiovascular: S1-S2 normal. No murmurs. Pulse regular.  Respiratory: Good air entry bilaterally. No rhonchi or  rales.  Abdomen: Soft and nontender. Normal bowel sounds. No organomegaly.  Musculoskeletal: No pedal edema   Neurological: Intact  Data Reviewed: Basic Metabolic Panel:  Recent Labs Lab  06/11/14 0510  06/13/14 0930 06/14/14 0345 06/14/14 0425 06/15/14 0525 06/16/14 1638 06/16/14 1659 06/17/14 0345  NA 140  < > 144 143  --  144 142  --  143  K 4.2  < > 3.0* 3.2*  --  4.0 2.6*  --  3.1*  CL 113*  < > 114* 115*  --  113* 112  --  108  CO2 18*  < > 20 21  --  23 26  --  26  GLUCOSE 107*  < > 79 88  --  83 172*  --  95  BUN 33*  < > 32* 29*  --  32* 26*  --  22  CREATININE 1.45*  < > 1.14* 1.08  --  0.88 0.87  --  0.85  CALCIUM 7.4*  < > 8.2* 8.2*  --  8.6 7.9*  --  8.1*  MG 1.6  --   --   --  1.6 2.4  --  1.6 1.6  PHOS 3.5  --   --   --   --  3.1  --   --  2.9  < > = values in this interval not displayed. Liver Function Tests:  Recent Labs Lab 06/10/14 1540 06/11/14 0510 06/13/14 0930 06/15/14 0525 06/16/14 1638  AST 57* 71* 47* 69* 72*  ALT 16 15 18 21 22   ALKPHOS 45 27* 49 84 66  BILITOT 1.3* 0.8 0.6 0.9 0.4  PROT 8.4* 6.6 6.0 7.0 6.5  ALBUMIN 3.6 2.5* 2.2* 2.5* 2.0*   No results for input(s): LIPASE, AMYLASE in the last 168 hours. No results for input(s): AMMONIA in the last 168 hours. CBC:  Recent Labs Lab 06/10/14 1540 06/11/14 0510 06/12/14 0400 06/13/14 0525 06/14/14 0345 06/15/14 0525 06/16/14 1638 06/17/14 0345  WBC 1.6* 1.8* 7.9 11.5* 15.6* 17.4* 12.4* 13.1*  NEUTROABS 1.2* 1.4* 7.2  --   --   --  11.2*  --   HGB 14.2 11.6* 11.8* 9.1* 10.6* 10.6* 10.4* 10.7*  HCT 42.8 35.4* 35.4* 26.1* 31.0* 31.9* 32.4* 31.3*  MCV 104.9* 103.5* 103.2* 100.0 99.4 101.3* 102.5* 102.6*  PLT 210 183 193 173 228 253 275 274   Cardiac Enzymes:  Recent Labs Lab 06/13/14 0930  TROPONINI 0.42*   BNP (last 3 results) No results for input(s): BNP in the last 8760 hours.  ProBNP (last 3 results)  Recent Labs  01/08/14 1401 01/09/14 0040  PROBNP 18570.0* 17819.0*    CBG:  Recent Labs Lab 06/12/14 0402 06/12/14 0440 06/12/14 0813 06/12/14 1224 06/12/14 1619  GLUCAP 69* 103* 78 131* 95    Recent Results (from the past 240 hour(s))   Culture, blood (routine x 2)     Status: None   Collection Time: 06/10/14  3:40 PM  Result Value Ref Range Status   Specimen Description BLOOD L FOREARM  Final   Special Requests BOTTLES DRAWN AEROBIC AND ANAEROBIC 2.5ML  Final   Culture   Final    NO GROWTH 5 DAYS Performed at [facility]    Report Status 06/16/2014 FINAL  Final  Culture, blood (routine x 2)     Status: None   Collection Time: 06/10/14  3:50 PM  Result Value Ref Range Status   Specimen Description BLOOD R FOREARM  Final  Special Requests BOTTLES DRAWN AEROBIC AND ANAEROBIC 5 ML  Final   Culture   Final    GROUP A STREP (S.PYOGENES) ISOLATED Note: CRITICAL RESULT CALLED TO, READ BACK BY AND VERIFIED WITH: MEGAN SHIFLETT @ 1059 ON [id number] BY [facility] FAXED TO [place] ATTN CONNIE WEANT [id number] BY MARHE Note: Gram Stain Report Called to,Read Back By and Verified With: MEGAN SCHOETT 06/11/14 0950 BY SMITHERSJ Performed at [facility]    Report Status 06/12/2014 FINAL  Final  Urine culture     Status: None   Collection Time: 06/10/14  4:44 PM  Result Value Ref Range Status   Specimen Description URINE, CATHETERIZED  Final   Special Requests NONE  Final   Colony Count NO GROWTH Performed at [facility]   Final   Culture NO GROWTH Performed at [facility]   Final   Report Status 06/12/2014 FINAL  Final  Culture, respiratory (NON-Expectorated)     Status: None   Collection Time: 06/10/14 10:00 PM  Result Value Ref Range Status   Specimen Description TRACHEAL ASPIRATE  Final   Special Requests NONE  Final   Gram Stain   Final    RARE WBC PRESENT, PREDOMINANTLY MONONUCLEAR NO SQUAMOUS EPITHELIAL CELLS SEEN NO ORGANISMS SEEN Performed at [facility]    Culture   Final    FEW GROUP A STREP (S.PYOGENES) ISOLATED Performed at [facility]    Report Status 06/12/2014 FINAL  Final  MRSA PCR Screening     Status: None   Collection Time: 06/10/14 10:44 PM   Result Value Ref Range Status   MRSA by PCR NEGATIVE NEGATIVE Final    Comment:        The GeneXpert MRSA Assay (FDA approved for NASAL specimens only), is one component of a comprehensive MRSA colonization surveillance program. It is not intended to diagnose MRSA infection nor to guide or monitor treatment for MRSA infections. Performed at [facility]   Respiratory virus panel     Status: Abnormal   Collection Time: 06/11/14 10:32 AM  Result Value Ref Range Status   Respiratory Syncytial Virus A Negative Negative Final   Respiratory Syncytial Virus B Negative Negative Final   Influenza A Positive (A) Negative Final    Comment: Positive for Influenza A 2009 H1N1   Influenza B Negative Negative Final   Parainfluenza 1 Negative Negative Final   Parainfluenza 2 Negative Negative Final   Parainfluenza 3 Negative Negative Final   Metapneumovirus Negative Negative Final   Rhinovirus Negative Negative Final   Adenovirus Negative Negative Final    Comment: (NOTE) Performed At: [facility] [address] [place], [place] [id number] Mila Homer MD VH:[id number]      Studies: Dg Chest Port 1 View  06/15/2014   CLINICAL DATA:  Cough, congestion, weakness  EXAM: PORTABLE CHEST - 1 VIEW  COMPARISON:  06/13/2014  FINDINGS: Patchy opacities in the right upper and lower lobes, suspicious for pneumonia.  Additional patchy opacities in the left lower lobe, atelectasis versus pneumonia.  Possible small right pleural effusion.  Cardiomegaly.  Left IJ venous catheter terminates in the mid SVC.  IMPRESSION: Multifocal patchy opacities, right lower lobe predominant, suspicious for multifocal pneumonia.   Electronically Signed   By: Charline Bills M.D.   On: 06/15/2014 19:54    Scheduled Meds: . amiodarone  200 mg Oral Daily  . [START ON 06/18/2014] amoxicillin-clavulanate  1 tablet Oral TID  . aspirin  325  mg Per Tube Daily  . bisoprolol  2.5 mg Oral Daily  .  diphenhydrAMINE  12.5 mg Intravenous Once  . enoxaparin (LOVENOX) injection  30 mg Subcutaneous Q24H  . feeding supplement (RESOURCE BREEZE)  1 Container Oral TID BM  . furosemide  20 mg Oral Daily  . oseltamivir  30 mg Oral BID  . piperacillin-tazobactam (ZOSYN)  IV  3.375 g Intravenous Q8H  . potassium chloride  10 mEq Intravenous Q1 Hr x 4  . sodium chloride  10-40 mL Intracatheter Q12H   Continuous Infusions:    Time spent: 25 minutes    Raeghan Demeter  [facility] Pager [phone]. If 7PM-7AM, please contact night-coverage at [email or web address], password [email or web address] 06/17/2014, 9:11 AM  LOS: 7 days

## 2014-06-18 MED ORDER — FUROSEMIDE 20 MG PO TABS
20.0000 mg | ORAL_TABLET | Freq: Every day | ORAL | Status: DC
Start: 2014-06-18 — End: 2014-09-09

## 2014-06-18 MED ORDER — AMIODARONE HCL 200 MG PO TABS: 200.0000 mg | ORAL_TABLET | Freq: Every day | ORAL | Status: DC

## 2014-06-18 MED ORDER — ASPIRIN 325 MG PO TABS: 325.0000 mg | ORAL_TABLET | Freq: Every day | ORAL | Status: DC

## 2014-06-18 MED ORDER — AMOXICILLIN-POT CLAVULANATE 500-125 MG PO TABS: 1.0000 | ORAL_TABLET | Freq: Three times a day (TID) | ORAL | Status: DC

## 2014-06-18 MED ORDER — ACETAMINOPHEN 325 MG PO TABS: 650.0000 mg | ORAL_TABLET | Freq: Four times a day (QID) | ORAL | Status: DC | PRN

## 2014-06-18 MED ORDER — BISOPROLOL FUMARATE 5 MG PO TABS: 2.5000 mg | ORAL_TABLET | Freq: Every day | ORAL | Status: DC

## 2014-06-18 MED ORDER — IPRATROPIUM-ALBUTEROL 0.5-2.5 (3) MG/3ML IN SOLN: 3.0000 mL | RESPIRATORY_TRACT | Status: DC | PRN

## 2014-06-18 MED ORDER — BOOST / RESOURCE BREEZE PO LIQD: 1.0000 | Freq: Three times a day (TID) | ORAL | Status: DC

## 2014-06-18 NOTE — Discharge Summary (Signed)
Physician Discharge Summary  Traci Hale:[id number]:[id number] DOB: 07-Feb-1925 DOA: 06/10/2014  PCP: Cain SaupeFULP, CAMMIE, MD  Admit date: 06/10/2014 Discharge date: 06/18/2014  Time spent: 35 minutes  Recommendations for Outpatient Follow-up:  1. abx until 3/23 2. DYS 2 diet-thin liquids 3. O2 PRN 4. Aspiration precautions 5. DNR 6. Cbc, bmp 1 week  Discharge Diagnoses:  Active Problems:   HTN (hypertension)   Sepsis   Community acquired pneumonia   Hypokalemia   Chronic systolic heart failure   Leukopenia   Respiratory failure, acute   Streptococcus pneumoniae   Protein-calorie malnutrition, severe   Influenza with pneumonia   Acute respiratory failure with hypoxia   Discharge Condition: improved  Diet recommendation: DYS 2 thin liquids  Filed Weights   06/10/14 2140 06/11/14 0500 06/12/14 0400  Weight: 52.9 kg (116 lb 10 oz) 58.2 kg (128 lb 4.9 oz) 58.6 kg (129 lb 3 oz)      Hospital Course:  Traci Hale is a pleasant 81 year old female with essential hypertension, chronic systolic CHF EF 35-40%, among other medical problems, [facility] for worsening confusion, and cough and she was found to be septic likely due to a combination of strep pneumonia/influenza. In ED Chest x-ray suggested right sided PNA, and patient was febrile to 103F.She was initially placed on BiPAP; however, remained hypoxic despite PEEP 10 hencedecision made to intubate for short term support, mindful of DNRcode status. She was successfully extubated on 06/12/2014. Respiratory culture and blood culture was positive for strep pneumonia. Patient also has Influenza type A. She was initially placed on broad-spectrum antibiotics and Tamiflu. Antibiotics were narrowed to Cefazolin. She remained toxic with white count 17,400, still with some confusion and generalized weakness on 06/15/14 raising concern that there might be a component of aspiration as she had weak cough, couldn't clear  secretions and chest x-ray showed "Multifocal patchy opacities, right lower lobe predominant, suspicious for multifocal pneumonia". Cefazolin was therefore discontinued and patient was started on Zosyn and she was continued on Tamiflu(30 mg twice a day mindful of renal insufficiency as patient still toxic). This combination seems to be doing the trick as her white count has improved to 12,400 and she is no longer toxic. During this hospital stay, patient also developed A. fib with RVR likely related to ongoing sepsis. She is on amiodarone/beta blocker and off anticoagulation -fall risk elevated.   Procedures:  intubation  Consultations:  PCCM  Discharge Exam: Filed Vitals:   06/18/14 0630  BP: 145/51  Pulse: 70  Temp: 97.9 F (36.6 C)  Resp: 18    General: awake, talking more Cardiovascular: rrr Respiratory: no wheezing  Discharge Instructions   Discharge Instructions    Discharge instructions    Complete by:  As directed   Cbc, bmp 1 week DYS 2 diet with aspiration precautions abx to 3/23     Increase activity slowly    Complete by:  As directed           Current Discharge Medication List    START taking these medications   Details  acetaminophen (TYLENOL) 325 MG tablet Take 2 tablets (650 mg total) by mouth every 6 (six) hours as needed for mild pain, moderate pain, fever or headache.    amiodarone (PACERONE) 200 MG tablet Take 1 tablet (200 mg total) by mouth daily.    amoxicillin-clavulanate (AUGMENTIN) 500-125 MG per tablet Take 1 tablet (500 mg total) by mouth 3 (three) times daily.  aspirin 325 MG tablet Take 1 tablet (325 mg total) by mouth daily.    bisoprolol (ZEBETA) 5 MG tablet Take 0.5 tablets (2.5 mg total) by mouth daily.    ipratropium-albuterol (DUONEB) 0.5-2.5 (3) MG/3ML SOLN Take 3 mLs by nebulization every 4 (four) hours as needed. Qty: 360 mL      CONTINUE these medications which have CHANGED   Details  feeding supplement, RESOURCE  BREEZE, (RESOURCE BREEZE) LIQD Take 1 Container by mouth 3 (three) times daily between meals. Refills: 0    furosemide (LASIX) 20 MG tablet Take 1 tablet (20 mg total) by mouth daily. Qty: 30 tablet      CONTINUE these medications which have NOT CHANGED   Details  Calcium Carbonate-Vitamin D (CALCIUM 600+D) 600-200 MG-UNIT TABS Take 1 tablet by mouth 2 (two) times daily.    Cholecalciferol (VITAMIN D-3) 5000 UNITS TABS Take 1 tablet by mouth daily.    Folic Acid-Vit B6-Vit B12 (FOLGARD) 0.8-10-0.115 MG TABS Take 1 tablet by mouth daily.    NIFEdipine (ADALAT CC) 90 MG 24 hr tablet Take 1 tablet by mouth daily.    Omega 3-6-9 Fatty Acids (OMEGA-3 FUSION) LIQD Take 5 mLs by mouth daily.  omega 3 liquid    Probiotic Product (PROBIOTIC DAILY PO) Take 1 capsule by mouth daily.    simvastatin (ZOCOR) 20 MG tablet Take 20 mg by mouth daily.      STOP taking these medications     aspirin EC 81 MG EC tablet      carvedilol (COREG) 12.5 MG tablet      lisinopril (PRINIVIL,ZESTRIL) 5 MG tablet        No Known Allergies    The results of significant diagnostics from this hospitalization (including imaging, microbiology, ancillary and laboratory) are listed below for reference.    Significant Diagnostic Studies: Dg Chest 1 View  06/10/2014   CLINICAL DATA:  Cough.  EXAM: CHEST  1 VIEW  COMPARISON:  01/08/2014  FINDINGS: Diffuse airspace disease throughout the right lung concerning for pneumonia. Possible patchy opacities in the left upper lobe. Heart is borderline in size. No visible effusions. No acute bony abnormality. Aortic calcifications without visible aneurysm.  IMPRESSION: Diffuse right lung airspace disease and possible patchy left upper lobe opacities. Findings most compatible with pneumonia.   Electronically Signed   By: Charlett Nose M.D.   On: 06/10/2014 17:06   Dg Abd 1 View  06/10/2014   CLINICAL DATA:  OG tube placement.  EXAM: ABDOMEN - 1 VIEW  COMPARISON:  None.   FINDINGS: The tip of the enteric tube is below the diaphragm likely in the stomach, the side port is in the region of the gastroesophageal junction. There is a nonobstructive bowel gas pattern. Lower thorax is rotated.  IMPRESSION: Tip of the enteric tube below the diaphragm, side-port in the region of the gastroesophageal junction. Advancement of 3-5 cm recommended for optimal placement.   Electronically Signed   By: Rubye Oaks M.D.   On: 06/10/2014 22:24   Ct Head Wo Contrast  06/10/2014   CLINICAL DATA:  Mental status changes. Disorientation. Incontinence.  EXAM: CT HEAD WITHOUT CONTRAST  CT CERVICAL SPINE WITHOUT CONTRAST  TECHNIQUE: Multidetector CT imaging of the head and cervical spine was performed following the standard protocol without intravenous contrast. Multiplanar CT image reconstructions of the cervical spine were also generated.  COMPARISON:  None.  FINDINGS: CT HEAD FINDINGS  The brain shows generalized age related atrophy. There is chronic appearing low  density in the deep white matter consistent with chronic small vessel disease. No cortical or large vessel territory infarction. No sign of acute infarction, mass lesion, hemorrhage, hydrocephalus or extra-axial collection. Calcification along the falx is incidentally noted, not significant. Sinuses, middle ears and mastoids are clear.  CT CERVICAL SPINE FINDINGS  Alignment is normal. No fracture. No evidence of degenerative disc disease. Mild mid cervical facet degeneration, most notable on the left at C3-4 and C4-5.  IMPRESSION: Head CT: Age related atrophy and chronic small vessel change of the deep white matter. No acute or reversible finding.  Cervical spine CT: No acute or traumatic finding. Minimal degenerative change.   Electronically Signed   By: Paulina Fusi M.D.   On: 06/10/2014 16:36   Ct Cervical Spine Wo Contrast  06/10/2014   CLINICAL DATA:  Mental status changes. Disorientation. Incontinence.  EXAM: CT HEAD WITHOUT  CONTRAST  CT CERVICAL SPINE WITHOUT CONTRAST  TECHNIQUE: Multidetector CT imaging of the head and cervical spine was performed following the standard protocol without intravenous contrast. Multiplanar CT image reconstructions of the cervical spine were also generated.  COMPARISON:  None.  FINDINGS: CT HEAD FINDINGS  The brain shows generalized age related atrophy. There is chronic appearing low density in the deep white matter consistent with chronic small vessel disease. No cortical or large vessel territory infarction. No sign of acute infarction, mass lesion, hemorrhage, hydrocephalus or extra-axial collection. Calcification along the falx is incidentally noted, not significant. Sinuses, middle ears and mastoids are clear.  CT CERVICAL SPINE FINDINGS  Alignment is normal. No fracture. No evidence of degenerative disc disease. Mild mid cervical facet degeneration, most notable on the left at C3-4 and C4-5.  IMPRESSION: Head CT: Age related atrophy and chronic small vessel change of the deep white matter. No acute or reversible finding.  Cervical spine CT: No acute or traumatic finding. Minimal degenerative change.   Electronically Signed   By: Paulina Fusi M.D.   On: 06/10/2014 16:36   Dg Chest Port 1 View  06/15/2014   CLINICAL DATA:  Cough, congestion, weakness  EXAM: PORTABLE CHEST - 1 VIEW  COMPARISON:  06/13/2014  FINDINGS: Patchy opacities in the right upper and lower lobes, suspicious for pneumonia.  Additional patchy opacities in the left lower lobe, atelectasis versus pneumonia.  Possible small right pleural effusion.  Cardiomegaly.  Left IJ venous catheter terminates in the mid SVC.  IMPRESSION: Multifocal patchy opacities, right lower lobe predominant, suspicious for multifocal pneumonia.   Electronically Signed   By: Charline Bills M.D.   On: 06/15/2014 19:54   Dg Chest Port 1 View  06/13/2014   CLINICAL DATA:  Aspiration pneumonia. History of hypertension, CHF. Former smoker.  EXAM: PORTABLE  CHEST - 1 VIEW  COMPARISON:  06/12/2014; 06/11/2014; 06/10/2014  FINDINGS: Grossly unchanged enlarged cardiac silhouette and mediastinal contours given persistent reduced lung volumes and patient rotation. Atherosclerotic plaque within the thoracic aorta. Interval extubation removal of enteric tube. Stable position of left jugular approach intravenous catheter with tip projected over the mid SVC. No pneumothorax.  Worsening right mid, lower and left upper lung ill-defined heterogeneous interstitial and airspace opacities with relative areas of confluence within the right lung base. Trace left-sided effusion is not excluded. Unchanged bones.  IMPRESSION: 1. Interval extubation removal of enteric tube. Otherwise, stable positioning of remaining support apparatus. No pneumothorax. 2. Worsening bilateral heterogeneous interstitial and airspace opacities, possibly accentuated due to patient positioning though additional differential considerations include asymmetric pulmonary edema versus progression  of multifocal infection and/or aspiration. Continued attention on follow-up is recommended.   Electronically Signed   By: Simonne Come M.D.   On: 06/13/2014 08:42   Dg Chest Port 1 View  06/12/2014   CLINICAL DATA:  Respiratory failure.  EXAM: PORTABLE CHEST - 1 VIEW  COMPARISON:  06/11/2014 .  FINDINGS: Endotracheal tube, left IJ line, NG tube in stable position. Mediastinum and hilar structures stable. Heart size stable. Interim slight improvement in diffuse right pulmonary infiltrate. No pneumothorax. No acute osseus abnormality.  IMPRESSION: 1. Lines and tubes in stable position. 2. Interim partial clearing of diffuse right lung infiltrate.   Electronically Signed   By: Maisie Fus  Register   On: 06/12/2014 07:30   Dg Chest Port 1 View  06/11/2014   CLINICAL DATA:  Respiratory failure.  EXAM: PORTABLE CHEST - 1 VIEW  COMPARISON:  06/10/2014.  FINDINGS: Endotracheal tube, NG tube, the last and position. Mediastinal  structures are stable. Heart size stable. Diffuse pulmonary infiltrates on the right remains. Left lung is clear. No pleural effusion or pneumothorax. No acute bony abnormality .  IMPRESSION: 1. Lines and tubes in stable position. 2. Diffuse unchanged right pulmonary infiltrate.   Electronically Signed   By: Maisie Fus  Register   On: 06/11/2014 07:17   Dg Chest Port 1 View  06/10/2014   CLINICAL DATA:  Line placement and endotracheal tube position.  EXAM: PORTABLE CHEST - 1 VIEW  COMPARISON:  06/10/2014  FINDINGS: Interval placement of an endotracheal tube with tip measuring 3.7 cm above the carina. A left central venous catheter was placed with tip over the mid SVC region. No pneumothorax. Airspace disease in the right lung suggesting asymmetric edema versus pneumonia. No blunting of costophrenic angles. Normal heart size and pulmonary vascularity. Calcified and tortuous aorta.  IMPRESSION: Appliances appear to be in satisfactory location. Persistent consolidation in the right lung suggesting pneumonia or asymmetric edema.   Electronically Signed   By: Burman Nieves M.D.   On: 06/10/2014 22:21   Dg Knee Complete 4 Views Right  06/10/2014   CLINICAL DATA:  Right hip pain and right knee pain after a fall.  EXAM: RIGHT KNEE - COMPLETE 4+ VIEW  COMPARISON:  None.  FINDINGS: Tricompartment degenerative changes in the right knee with tricompartment narrowing osteophytosis. No significant effusion. Suggestion of ligamentous laxity in the patellar ligament. Ligamentous disruption is not excluded. No evidence of acute fracture or dislocation. No focal bone lesion or bone destruction.  IMPRESSION: Prominent degenerative changes in the right knee. No significant effusion. No acute bony abnormalities. Suggestion of ligamentous laxity of the patellar ligament.   Electronically Signed   By: Burman Nieves M.D.   On: 06/10/2014 17:11   Dg Hip Unilat With Pelvis 2-3 Views Right  06/10/2014   CLINICAL DATA:  Disoriented.   Fall.  Right hip pain.  EXAM: RIGHT HIP (WITH PELVIS) 2-3 VIEWS  COMPARISON:  None.  FINDINGS: Hardware noted within the left femur from prior injury and internal fixation. No acute fracture, subluxation or dislocation. Mild osteopenia. Hip joints and SI joints are symmetric.  IMPRESSION: No acute bony abnormality.   Electronically Signed   By: Charlett Nose M.D.   On: 06/10/2014 17:11    Microbiology: Recent Results (from the past 240 hour(s))  Culture, blood (routine x 2)     Status: None   Collection Time: 06/10/14  3:40 PM  Result Value Ref Range Status   Specimen Description BLOOD L FOREARM  Final   Special  Requests BOTTLES DRAWN AEROBIC AND ANAEROBIC 2.5ML  Final   Culture   Final    NO GROWTH 5 DAYS Performed at [facility]    Report Status 06/16/2014 FINAL  Final  Culture, blood (routine x 2)     Status: None   Collection Time: 06/10/14  3:50 PM  Result Value Ref Range Status   Specimen Description BLOOD R FOREARM  Final   Special Requests BOTTLES DRAWN AEROBIC AND ANAEROBIC 5 ML  Final   Culture   Final    GROUP A STREP (S.PYOGENES) ISOLATED Note: CRITICAL RESULT CALLED TO, READ BACK BY AND VERIFIED WITH: MEGAN SHIFLETT @ 1059 ON [id number] BY [facility] FAXED TO [facility] ATTN CONNIE WEANT [id number] BY MARHE Note: Gram Stain Report Called to,Read Back By and Verified With: MEGAN SCHOETT 06/11/14 0950 BY SMITHERSJ Performed at [facility]    Report Status 06/12/2014 FINAL  Final  Urine culture     Status: None   Collection Time: 06/10/14  4:44 PM  Result Value Ref Range Status   Specimen Description URINE, CATHETERIZED  Final   Special Requests NONE  Final   Colony Count NO GROWTH Performed at [facility]   Final   Culture NO GROWTH Performed at [facility]   Final   Report Status 06/12/2014 FINAL  Final  Culture, respiratory (NON-Expectorated)     Status: None   Collection Time: 06/10/14 10:00 PM  Result Value Ref Range Status   Specimen  Description TRACHEAL ASPIRATE  Final   Special Requests NONE  Final   Gram Stain   Final    RARE WBC PRESENT, PREDOMINANTLY MONONUCLEAR NO SQUAMOUS EPITHELIAL CELLS SEEN NO ORGANISMS SEEN Performed at [facility]    Culture   Final    FEW GROUP A STREP (S.PYOGENES) ISOLATED Performed at [facility]    Report Status 06/12/2014 FINAL  Final  MRSA PCR Screening     Status: None   Collection Time: 06/10/14 10:44 PM  Result Value Ref Range Status   MRSA by PCR NEGATIVE NEGATIVE Final    Comment:        The GeneXpert MRSA Assay (FDA approved for NASAL specimens only), is one component of a comprehensive MRSA colonization surveillance program. It is not intended to diagnose MRSA infection nor to guide or monitor treatment for MRSA infections. Performed at [facility]   Respiratory virus panel     Status: Abnormal   Collection Time: 06/11/14 10:32 AM  Result Value Ref Range Status   Respiratory Syncytial Virus A Negative Negative Final   Respiratory Syncytial Virus B Negative Negative Final   Influenza A Positive (A) Negative Final    Comment: Positive for Influenza A 2009 H1N1   Influenza B Negative Negative Final   Parainfluenza 1 Negative Negative Final   Parainfluenza 2 Negative Negative Final   Parainfluenza 3 Negative Negative Final   Metapneumovirus Negative Negative Final   Rhinovirus Negative Negative Final   Adenovirus Negative Negative Final    Comment: (NOTE) Performed At: [facility] [address] [place], [place] [id number] Mila Homer MD MW:[phone]      Labs: Basic Metabolic Panel:  Recent Labs Lab 06/13/14 0930 06/14/14 0345 06/14/14 0425 06/15/14 0525 06/16/14 1638 06/16/14 1659 06/17/14 0345  NA 144 143  --  144 142  --  143  K 3.0* 3.2*  --  4.0 2.6*  --  3.1*  CL 114* 115*  --  113* 112  --  108  CO2 20 21  --  23 26  --  26  GLUCOSE 79 88  --  83 172*  --  95  BUN 32* 29*  --  32* 26*  --  22   CREATININE 1.14* 1.08  --  0.88 0.87  --  0.85  CALCIUM 8.2* 8.2*  --  8.6 7.9*  --  8.1*  MG  --   --  1.6 2.4  --  1.6 1.6  PHOS  --   --   --  3.1  --   --  2.9   Liver Function Tests:  Recent Labs Lab 06/13/14 0930 06/15/14 0525 06/16/14 1638  AST 47* 69* 72*  ALT 18 21 22   ALKPHOS 49 84 66  BILITOT 0.6 0.9 0.4  PROT 6.0 7.0 6.5  ALBUMIN 2.2* 2.5* 2.0*   No results for input(s): LIPASE, AMYLASE in the last 168 hours. No results for input(s): AMMONIA in the last 168 hours. CBC:  Recent Labs Lab 06/12/14 0400 06/13/14 0525 06/14/14 0345 06/15/14 0525 06/16/14 1638 06/17/14 0345  WBC 7.9 11.5* 15.6* 17.4* 12.4* 13.1*  NEUTROABS 7.2  --   --   --  11.2*  --   HGB 11.8* 9.1* 10.6* 10.6* 10.4* 10.7*  HCT 35.4* 26.1* 31.0* 31.9* 32.4* 31.3*  MCV 103.2* 100.0 99.4 101.3* 102.5* 102.6*  PLT 193 173 228 253 275 274   Cardiac Enzymes:  Recent Labs Lab 06/13/14 0930  TROPONINI 0.42*   BNP: BNP (last 3 results) No results for input(s): BNP in the last 8760 hours.  ProBNP (last 3 results)  Recent Labs  01/08/14 1401 01/09/14 0040  PROBNP 18570.0* 17819.0*    CBG:  Recent Labs Lab 06/12/14 0402 06/12/14 0440 06/12/14 0813 06/12/14 1224 06/12/14 1619  GLUCAP 69* 103* 78 131* 95       Signed:  Dara Camargo  [facility] 06/18/2014, 10:15 AM

## 2014-06-18 NOTE — Clinical Social Work Note (Signed)
[facility]- Family and patient agreeable to this plan- [facility] #[id number]. Will plan transfer via EMS. Reece LevyJanet Jassen Sarver, MSW, Theresia MajorsLCSWA [phone]

## 2014-06-18 NOTE — Progress Notes (Signed)
Gave report to Antionette at [facility]. Left number if she had additional questions.

## 2014-07-22 ENCOUNTER — Encounter (HOSPITAL_COMMUNITY): Payer: Self-pay | Admitting: *Deleted

## 2014-07-22 ENCOUNTER — Emergency Department (INDEPENDENT_AMBULATORY_CARE_PROVIDER_SITE_OTHER): Payer: Commercial Managed Care - HMO

## 2014-07-22 ENCOUNTER — Emergency Department (INDEPENDENT_AMBULATORY_CARE_PROVIDER_SITE_OTHER)
Admission: EM | Admit: 2014-07-22 | Discharge: 2014-07-22 | Disposition: A | Discharge status: Home / Self Care | Payer: Commercial Managed Care - HMO | Source: Home / Self Care | Attending: Emergency Medicine | Admitting: Emergency Medicine

## 2014-07-22 DIAGNOSIS — W19XXXA Unspecified fall, initial encounter: Secondary | ICD-10-CM | POA: Diagnosis not present

## 2014-07-22 DIAGNOSIS — S62609A Fracture of unspecified phalanx of unspecified finger, initial encounter for closed fracture: Secondary | ICD-10-CM

## 2014-07-22 MED ORDER — ACETAMINOPHEN 325 MG PO TABS
ORAL_TABLET | ORAL | Status: AC
Start: 2014-07-22 — End: 2014-07-22
  Filled 2014-07-22: qty 2

## 2014-07-22 MED ORDER — ACETAMINOPHEN 325 MG PO TABS
650.0000 mg | ORAL_TABLET | Freq: Once | ORAL | Status: AC
  Administered 2014-07-22: 650 mg via ORAL

## 2014-07-22 NOTE — ED Notes (Signed)
Pt  Fell     3  Days  Ago     -      She  Injured  Her  Chest   As  Well  As  The  Finger  Of  The  l   Index  Finger      Swelling is  Present             To  The    Affected  Finger         -   Pt  Reports       Pain in her  Chest         When  She takes  A  Deep     Breath

## 2014-07-22 NOTE — ED Provider Notes (Signed)
CSN: [id number]     Arrival date & time 07/22/14  1432 History   First MD Initiated Contact with Patient 07/22/14 1638     Chief Complaint  Patient presents with  . Fall   (Consider location/radiation/quality/duration/timing/severity/associated sxs/prior Treatment) Patient is a 81 y.o. female presenting with fall. The history is provided by the patient. No language interpreter was used.  Fall This is a new problem. The current episode started more than 2 days ago. The problem occurs constantly. The problem has not changed since onset.Associated symptoms include chest pain. Nothing aggravates the symptoms. Nothing relieves the symptoms. She has tried nothing for the symptoms.  Pt complains of pain in her middle chest and left index finger.   Pt reports she slipped and fell,  Pt hit her chest and her finger.  No impact of head. No loss of consciousness.   Pt fell 3 days ago  Past Medical History  Diagnosis Date  . Hypertension   . CHF (congestive heart failure)   . Hyperlipidemia   . Heart murmur   . Shortness of breath   . GERD (gastroesophageal reflux disease)    Past Surgical History  Procedure Laterality Date  . Leg surgery     Family History  Problem Relation Age of Onset  . Hypertension Father   . Breast cancer Daughter    History  Substance Use Topics  . Smoking status: Former Games developermoker  . Smokeless tobacco: Never Used     Comment: quit smoking in the 80"s   . Alcohol Use: Yes     Comment: occasional   OB History    No data available     Review of Systems  Cardiovascular: Positive for chest pain.  All other systems reviewed and are negative.   Allergies  Review of patient's allergies indicates no known allergies.  Home Medications   Prior to Admission medications   Medication Sig Start Date End Date Taking? Authorizing Provider  acetaminophen (TYLENOL) 325 MG tablet Take 2 tablets (650 mg total) by mouth every 6 (six) hours as needed for mild pain, moderate  pain, fever or headache. 06/18/14   Joseph ArtJessica U Vann, DO  amiodarone (PACERONE) 200 MG tablet Take 1 tablet (200 mg total) by mouth daily. 06/18/14   Joseph ArtJessica U Vann, DO  amoxicillin-clavulanate (AUGMENTIN) 500-125 MG per tablet Take 1 tablet (500 mg total) by mouth 3 (three) times daily. 06/18/14   Joseph ArtJessica U Vann, DO  aspirin 325 MG tablet Take 1 tablet (325 mg total) by mouth daily. 06/18/14   Joseph ArtJessica U Vann, DO  bisoprolol (ZEBETA) 5 MG tablet Take 0.5 tablets (2.5 mg total) by mouth daily. 06/18/14   Joseph ArtJessica U Vann, DO  Calcium Carbonate-Vitamin D (CALCIUM 600+D) 600-200 MG-UNIT TABS Take 1 tablet by mouth 2 (two) times daily.    Historical Provider, MD  Cholecalciferol (VITAMIN D-3) 5000 UNITS TABS Take 1 tablet by mouth daily.    Historical Provider, MD  feeding supplement, RESOURCE BREEZE, (RESOURCE BREEZE) LIQD Take 1 Container by mouth 3 (three) times daily between meals. 06/18/14   Joseph ArtJessica U Vann, DO  Folic Acid-Vit B6-Vit B12 (FOLGARD) 0.8-10-0.115 MG TABS Take 1 tablet by mouth daily.    Historical Provider, MD  furosemide (LASIX) 20 MG tablet Take 1 tablet (20 mg total) by mouth daily. 06/18/14   Joseph ArtJessica U Vann, DO  ipratropium-albuterol (DUONEB) 0.5-2.5 (3) MG/3ML SOLN Take 3 mLs by nebulization every 4 (four) hours as needed. 06/18/14   Joseph ArtJessica U Vann, DO  NIFEdipine (ADALAT CC) 90 MG 24 hr tablet Take 1 tablet by mouth daily. 06/05/14   Historical Provider, MD  Omega 3-6-9 Fatty Acids (OMEGA-3 FUSION) LIQD Take 5 mLs by mouth daily.  omega 3 liquid    Historical Provider, MD  Probiotic Product (PROBIOTIC DAILY PO) Take 1 capsule by mouth daily.    Historical Provider, MD  simvastatin (ZOCOR) 20 MG tablet Take 20 mg by mouth daily.    Historical Provider, MD   BP 182/94 mmHg  Pulse 71  Temp(Src) 98.3 F (36.8 C) (Oral)  Resp 18  SpO2 100% Physical Exam  Constitutional: She is oriented to person, place, and time. She appears well-developed and well-nourished.  HENT:  Head:  Normocephalic and atraumatic.  Eyes: Pupils are equal, round, and reactive to light.  Cardiovascular: Normal rate.   Pulmonary/Chest: Effort normal. She exhibits tenderness.  Tender sternal area chest, no bruising  Abdominal: Soft.  Musculoskeletal: She exhibits tenderness.  Swollen tender left index finger,  Tender to palpation decreased range of motion,  nv and ns intact. 2cm bruise left knee nontender,     Neurological: She is alert and oriented to person, place, and time.  Skin: Skin is warm.    ED Course  Procedures (including critical care time) Labs Review Labs Reviewed - No data to display  Imaging Review Dg Chest 2 View  07/22/2014   CLINICAL DATA:  81 year old female with chest pain on inspiration. Recent fall 3 days previously with persistent chest soreness.  EXAM: CHEST  2 VIEW  COMPARISON:  Prior chest x-ray 06/15/2014  FINDINGS: Stable cardiomegaly and mediastinal contours. Atherosclerotic calcifications noted throughout the transverse aorta. Inspiratory volumes are normal. There are mild chronic central bronchitic changes. No evidence of pneumothorax, pleural effusion or pulmonary contusion. No focal osseous abnormality. Exaggerated cervical thoracic kyphosis.  IMPRESSION: No active cardiopulmonary disease.   Electronically Signed   By: Malachy Moan M.D.   On: 07/22/2014 18:26   Dg Finger Index Left  07/22/2014   CLINICAL DATA:  81 year old female with a history of fall and pain.  EXAM: LEFT INDEX FINGER 2+V  COMPARISON:  None.  FINDINGS: Acute transverse fracture at the base of the middle phalanx of the first finger. Associated soft tissue swelling. No significant displacement or angulation at the site.  Osteopenia.  Degenerative changes at the interphalangeal joints.  IMPRESSION: Acute transverse fracture, nondisplaced, of the base of the middle phalanx of the first finger. Associated soft tissue swelling.  Signed,  Yvone Neu. Loreta Ave, DO  [facility]  [facility]   Electronically Signed   By: Gilmer Mor D.O.   On: 07/22/2014 18:25     MDM   1. Fracture of phalanx of finger, closed, initial encounter   2. Fall    Splint finger Tylenol See Dr. Melvyn Novas for recheck in 1 week.    Lonia Skinner [place], PA-C 07/22/14 2057

## 2014-07-22 NOTE — Discharge Instructions (Signed)

## 2014-09-02 ENCOUNTER — Encounter (HOSPITAL_COMMUNITY): Payer: Self-pay | Admitting: Emergency Medicine

## 2014-09-02 ENCOUNTER — Emergency Department (HOSPITAL_COMMUNITY): Payer: Commercial Managed Care - HMO

## 2014-09-02 ENCOUNTER — Inpatient Hospital Stay (HOSPITAL_COMMUNITY)
Admission: EM | Admit: 2014-09-02 | Discharge: 2014-09-09 | DRG: 469 | Disposition: A | Discharge status: Skilled Nursing Facility | Payer: Commercial Managed Care - HMO | Attending: Internal Medicine | Admitting: Internal Medicine

## 2014-09-02 DIAGNOSIS — Z79899 Other long term (current) drug therapy: Secondary | ICD-10-CM

## 2014-09-02 DIAGNOSIS — J189 Pneumonia, unspecified organism: Secondary | ICD-10-CM | POA: Diagnosis present

## 2014-09-02 DIAGNOSIS — I255 Ischemic cardiomyopathy: Secondary | ICD-10-CM | POA: Diagnosis present

## 2014-09-02 DIAGNOSIS — I214 Non-ST elevation (NSTEMI) myocardial infarction: Secondary | ICD-10-CM | POA: Diagnosis present

## 2014-09-02 DIAGNOSIS — I10 Essential (primary) hypertension: Secondary | ICD-10-CM | POA: Diagnosis present

## 2014-09-02 DIAGNOSIS — E43 Unspecified severe protein-calorie malnutrition: Secondary | ICD-10-CM | POA: Diagnosis present

## 2014-09-02 DIAGNOSIS — Z9181 History of falling: Secondary | ICD-10-CM | POA: Diagnosis not present

## 2014-09-02 DIAGNOSIS — I2510 Atherosclerotic heart disease of native coronary artery without angina pectoris: Secondary | ICD-10-CM | POA: Diagnosis present

## 2014-09-02 DIAGNOSIS — D62 Acute posthemorrhagic anemia: Secondary | ICD-10-CM | POA: Diagnosis not present

## 2014-09-02 DIAGNOSIS — Z8249 Family history of ischemic heart disease and other diseases of the circulatory system: Secondary | ICD-10-CM | POA: Diagnosis not present

## 2014-09-02 DIAGNOSIS — K219 Gastro-esophageal reflux disease without esophagitis: Secondary | ICD-10-CM | POA: Diagnosis present

## 2014-09-02 DIAGNOSIS — S72001A Fracture of unspecified part of neck of right femur, initial encounter for closed fracture: Secondary | ICD-10-CM | POA: Diagnosis present

## 2014-09-02 DIAGNOSIS — Z87891 Personal history of nicotine dependence: Secondary | ICD-10-CM | POA: Diagnosis not present

## 2014-09-02 DIAGNOSIS — W19XXXA Unspecified fall, initial encounter: Secondary | ICD-10-CM

## 2014-09-02 DIAGNOSIS — I248 Other forms of acute ischemic heart disease: Secondary | ICD-10-CM | POA: Diagnosis present

## 2014-09-02 DIAGNOSIS — Y92017 Garden or yard in single-family (private) house as the place of occurrence of the external cause: Secondary | ICD-10-CM

## 2014-09-02 DIAGNOSIS — Z419 Encounter for procedure for purposes other than remedying health state, unspecified: Secondary | ICD-10-CM

## 2014-09-02 DIAGNOSIS — J9601 Acute respiratory failure with hypoxia: Secondary | ICD-10-CM | POA: Diagnosis present

## 2014-09-02 DIAGNOSIS — Z803 Family history of malignant neoplasm of breast: Secondary | ICD-10-CM

## 2014-09-02 DIAGNOSIS — Z66 Do not resuscitate: Secondary | ICD-10-CM | POA: Diagnosis present

## 2014-09-02 DIAGNOSIS — L89152 Pressure ulcer of sacral region, stage 2: Secondary | ICD-10-CM | POA: Diagnosis present

## 2014-09-02 DIAGNOSIS — E876 Hypokalemia: Secondary | ICD-10-CM | POA: Diagnosis not present

## 2014-09-02 DIAGNOSIS — I5022 Chronic systolic (congestive) heart failure: Secondary | ICD-10-CM

## 2014-09-02 DIAGNOSIS — Y92009 Unspecified place in unspecified non-institutional (private) residence as the place of occurrence of the external cause: Secondary | ICD-10-CM

## 2014-09-02 DIAGNOSIS — I5042 Chronic combined systolic (congestive) and diastolic (congestive) heart failure: Secondary | ICD-10-CM | POA: Diagnosis not present

## 2014-09-02 DIAGNOSIS — R7989 Other specified abnormal findings of blood chemistry: Secondary | ICD-10-CM | POA: Diagnosis not present

## 2014-09-02 DIAGNOSIS — D649 Anemia, unspecified: Secondary | ICD-10-CM | POA: Diagnosis present

## 2014-09-02 DIAGNOSIS — Z6821 Body mass index (BMI) 21.0-21.9, adult: Secondary | ICD-10-CM | POA: Diagnosis not present

## 2014-09-02 DIAGNOSIS — W010XXA Fall on same level from slipping, tripping and stumbling without subsequent striking against object, initial encounter: Secondary | ICD-10-CM | POA: Diagnosis present

## 2014-09-02 DIAGNOSIS — M25551 Pain in right hip: Secondary | ICD-10-CM | POA: Diagnosis present

## 2014-09-02 DIAGNOSIS — W19XXXD Unspecified fall, subsequent encounter: Secondary | ICD-10-CM

## 2014-09-02 DIAGNOSIS — Z5111 Encounter for antineoplastic chemotherapy: Secondary | ICD-10-CM | POA: Diagnosis not present

## 2014-09-02 DIAGNOSIS — L8990 Pressure ulcer of unspecified site, unspecified stage: Secondary | ICD-10-CM | POA: Insufficient documentation

## 2014-09-02 DIAGNOSIS — R778 Other specified abnormalities of plasma proteins: Secondary | ICD-10-CM | POA: Diagnosis present

## 2014-09-02 DIAGNOSIS — E785 Hyperlipidemia, unspecified: Secondary | ICD-10-CM | POA: Diagnosis present

## 2014-09-02 DIAGNOSIS — Z7982 Long term (current) use of aspirin: Secondary | ICD-10-CM

## 2014-09-02 DIAGNOSIS — I5043 Acute on chronic combined systolic (congestive) and diastolic (congestive) heart failure: Secondary | ICD-10-CM | POA: Diagnosis present

## 2014-09-02 DIAGNOSIS — Y9301 Activity, walking, marching and hiking: Secondary | ICD-10-CM

## 2014-09-02 DIAGNOSIS — S72009A Fracture of unspecified part of neck of unspecified femur, initial encounter for closed fracture: Secondary | ICD-10-CM

## 2014-09-02 DIAGNOSIS — Z8701 Personal history of pneumonia (recurrent): Secondary | ICD-10-CM

## 2014-09-02 DIAGNOSIS — Z96641 Presence of right artificial hip joint: Secondary | ICD-10-CM

## 2014-09-02 LAB — BASIC METABOLIC PANEL
Anion gap: 6 (ref 5–15)
BUN: 18 mg/dL (ref 6–20)
CALCIUM: 8.9 mg/dL (ref 8.9–10.3)
CHLORIDE: 112 mmol/L — AB (ref 101–111)
CO2: 25 mmol/L (ref 22–32)
Creatinine, Ser: 0.82 mg/dL (ref 0.44–1.00)
GFR calc non Af Amer: >60 mL/min (ref >60)
Glucose, Bld: 123 mg/dL — ABNORMAL HIGH (ref 65–99)
Potassium: 3.6 mmol/L (ref 3.5–5.1)
SODIUM: 143 mmol/L (ref 135–145)

## 2014-09-02 LAB — URINALYSIS, ROUTINE W REFLEX MICROSCOPIC
Bilirubin Urine: NEGATIVE
Glucose, UA: NEGATIVE mg/dL
HGB URINE DIPSTICK: NEGATIVE
KETONES UR: NEGATIVE mg/dL
NITRITE: NEGATIVE
PH: 6 (ref 5.0–8.0)
Protein, ur: NEGATIVE mg/dL
SPECIFIC GRAVITY, URINE: 1.017 (ref 1.005–1.030)
UROBILINOGEN UA: 0.2 mg/dL (ref 0.0–1.0)

## 2014-09-02 LAB — CBC WITH DIFFERENTIAL/PLATELET
BASOS ABS: 0 10*3/uL (ref 0.0–0.1)
Basophils Relative: 0 % (ref 0–1)
Eosinophils Absolute: 0 10*3/uL (ref 0.0–0.7)
Eosinophils Relative: 0 % (ref 0–5)
HCT: 34.9 % — ABNORMAL LOW (ref 36.0–46.0)
HEMOGLOBIN: 11 g/dL — AB (ref 12.0–15.0)
LYMPHS PCT: 9 % — AB (ref 12–46)
Lymphs Abs: 0.5 10*3/uL — ABNORMAL LOW (ref 0.7–4.0)
MCH: 33.1 pg (ref 26.0–34.0)
MCHC: 31.5 g/dL (ref 30.0–36.0)
MCV: 105.1 fL — ABNORMAL HIGH (ref 78.0–100.0)
MONOS PCT: 4 % (ref 3–12)
Monocytes Absolute: 0.2 10*3/uL (ref 0.1–1.0)
NEUTROS ABS: 4.9 10*3/uL (ref 1.7–7.7)
Neutrophils Relative %: 87 % — ABNORMAL HIGH (ref 43–77)
Platelets: 369 10*3/uL (ref 150–400)
RBC: 3.32 MIL/uL — ABNORMAL LOW (ref 3.87–5.11)
RDW: 14.6 % (ref 11.5–15.5)
WBC: 5.6 10*3/uL (ref 4.0–10.5)

## 2014-09-02 LAB — URINE MICROSCOPIC-ADD ON

## 2014-09-02 LAB — I-STAT TROPONIN, ED: Troponin i, poc: 0.17 ng/mL (ref 0.00–0.08)

## 2014-09-02 LAB — BRAIN NATRIURETIC PEPTIDE: B Natriuretic Peptide: 1502.4 pg/mL — ABNORMAL HIGH (ref 0.0–100.0)

## 2014-09-02 MED ORDER — MORPHINE SULFATE 4 MG/ML IJ SOLN
4.0000 mg | Freq: Once | INTRAMUSCULAR | Status: AC
  Administered 2014-09-02: 4 mg via INTRAVENOUS
  Filled 2014-09-02: qty 1

## 2014-09-02 MED ORDER — MORPHINE SULFATE 2 MG/ML IJ SOLN
2.0000 mg | Freq: Once | INTRAMUSCULAR | Status: AC
  Administered 2014-09-02: 2 mg via INTRAVENOUS
  Filled 2014-09-02: qty 1

## 2014-09-02 MED ORDER — MORPHINE SULFATE 2 MG/ML IJ SOLN
2.0000 mg | Freq: Once | INTRAMUSCULAR | Status: AC
Start: 2014-09-02 — End: 2014-09-02
  Administered 2014-09-02: 2 mg via INTRAVENOUS
  Filled 2014-09-02: qty 1

## 2014-09-02 MED ORDER — DEXTROSE 5 % IV SOLN
500.0000 mg | Freq: Once | INTRAVENOUS | Status: AC
  Administered 2014-09-02: 500 mg via INTRAVENOUS
  Filled 2014-09-02: qty 500

## 2014-09-02 MED ORDER — SODIUM CHLORIDE 0.9 % IV BOLUS (SEPSIS)
500.0000 mL | Freq: Once | INTRAVENOUS | Status: AC
  Administered 2014-09-02: 500 mL via INTRAVENOUS

## 2014-09-02 MED ORDER — DEXTROSE 5 % IV SOLN
1.0000 g | Freq: Once | INTRAVENOUS | Status: AC
  Administered 2014-09-02: 1 g via INTRAVENOUS
  Filled 2014-09-02: qty 10

## 2014-09-02 MED ORDER — FENTANYL CITRATE (PF) 100 MCG/2ML IJ SOLN
50.0000 ug | Freq: Once | INTRAMUSCULAR | Status: AC
  Administered 2014-09-02: 50 ug via INTRAVENOUS
  Filled 2014-09-02: qty 2

## 2014-09-02 MED ORDER — FENTANYL CITRATE (PF) 100 MCG/2ML IJ SOLN: 25.0000 ug | Freq: Once | INTRAMUSCULAR | Status: DC

## 2014-09-02 NOTE — ED Notes (Signed)
MD at bedside. EDP ONI PRESENT

## 2014-09-02 NOTE — ED Notes (Signed)
Patient transported to X-ray 

## 2014-09-02 NOTE — ED Notes (Signed)
MD Made aware of troponin level

## 2014-09-02 NOTE — ED Notes (Signed)
Granddaughter at bedside.  Patient is resting after being repositioned again by staff.

## 2014-09-02 NOTE — Progress Notes (Signed)
Patient ID: Traci CopperOphelia G Hale, female   DOB: January 03, 1925, 81 y.o.   MRN: [id number] I have reviewed Traci Hale's x-rays.  She has a right hip femoral neck fracture that is displaced.  From an Orthopedic standpoint, she will need a right hip hemiarthroplasty (partial hip replacement).  This can be performed late tomorrow 6/2 pending both Medical and Cardiac clearance.  Given OR availability late in the day and after hours, transferring to [facility] would be reasonable to assure a better OR time than offered after hours at [facility] unfortunately.

## 2014-09-02 NOTE — ED Notes (Signed)
Bed: WA15 Expected date:  Expected time:  Means of arrival:  Comments: EMS-fall 

## 2014-09-02 NOTE — Clinical Social Work Note (Signed)
Clinical Social Work Assessment  Patient Details  Name: Traci Hale MRN: [id number] Date of Birth: 1924/04/25  Date of referral:  09/02/14               Reason for consult:   (Fall.)                Permission sought to share information with:   (None.) Permission granted to share information::   (None.)  Name::        Agency::     Relationship::     Contact Information:     Housing/Transportation Living arrangements for the past 2 months:  Single Family Home (Pt lives at home in [place]. Traci Hale is currently living with the pt.) Source of Information:  Patient Patient Interpreter Needed:  None Criminal Activity/Legal Involvement Pertinent to Current Situation/Hospitalization:  No - Comment as needed Significant Relationships:  Adult Children, Other Family Members ([place] daughter informed CSW that the pt has a great support system that includes her family members.) Lives with:  Adult Children (Adult grandchild lives with pt.) Do you feel safe going back to the place where you live?  Yes (Patient states that she wants to return home. Patient states that she is not interested in SNF. ) Need for family participation in patient care:  Yes (Comment) (CSW consulted with nure who states that the pt has broken her femur bone.)  Care giving concerns:  The pt is currently living at home in [place]. She informed CSW that one of her Traci Hale's lives with her. However, the pt now has a broken Femur Bone due to her fall today.   Patient states that she is not interested in facility. Patient stated " I don't want to leave home."  Traci Hale states that the pt has a great support system that includes family. She also states that she visits her everyday.  Traci Hale/Traci Hale [phone]   Social Worker assessment / plan:  CSW met with pt at bedside. Traci Hale was present. Patient confirms that she comes from home. Traci Hale is currently living in the home with her  in [place]. Also, pt confirms that she presents to [facility] due a fall. Patient stated " I fell hard." Patient confirms that she fell trying to run away from bee's.Patient states that she does not fall often. Patient informed CSW that she completes her ADL's independently.   Employment status:  Retired Forensic scientist:   [employer].) PT Recommendations:  Not assessed at this time Information / Referral to community resources:   (CSW will give pt a list of SNF.)  Patient/Family's Response to care:  Patient appears to be in pain. However, she states that she is not interested in SNF.   Patient/Family's Understanding of and Emotional Response to Diagnosis, Current Treatment, and Prognosis:  Patient is aware that she will be admitted. Patient and family are aware that her Femur bone is broken.  Emotional Assessment Appearance:  Appears older than stated age Attitude/Demeanor/Rapport:   Cytogeneticist is adamant that she does not want to go to a SNF.  Pt expressed to CSW that she would like to retun home.) Affect (typically observed):  Afraid/Fearful Orientation:  Oriented to Self, Oriented to Place, Oriented to  Time, Oriented to Situation Alcohol / Substance use:   (Patient states that she use to smike and drink over 30 years ago.) Psych involvement (Current and /or in the community):  No (Comment)  Discharge Needs  Concerns to be addressed:  Adjustment to Illness Readmission within the  last 30 days:  No Current discharge risk:  None Barriers to Discharge:  No Barriers Identified   Bernita Buffy, LCSW 09/02/2014, 11:03 PM

## 2014-09-02 NOTE — ED Notes (Signed)
Patient denies pain while lying still.

## 2014-09-02 NOTE — Progress Notes (Signed)
EDCM spoke to patient and her grand daughter Traci Hale at bedside.  Patient lives at home with her other grand daughter Traci Hale.  Patient provided Dietra's phone number for emergencies [phone]. Patient has a cane, walker and elevated toilet seat at home.  Patient wears oxygen at home supplied by [facility].  Patient wears her oxygen as needed at home per patient.  Patient's grand daughter believes patient has had [facility] in the past for home health services.  Patient is usually able to perform her own ADL's at home without difficulty.  Patient confirms her pcp is Dr. Cain Saupeammie Fulp of [facility].  EDCM explained home health services to patient and family.  Patient and family thankful for services.  No further EDCM needs at this time.

## 2014-09-02 NOTE — Consult Note (Signed)
Reason for Consult: elevated troponin and history of heart failure Referring Physician: Dahlia Bailiff Primary Cardiologist: has seen HF clinic on time Traci Hale is an 81 y.o. female.  HPI: Traci Hale is an 81 yo woman with PMH of hypertension, dyslipidemia GERD and hospitalization from 3/9-3/17 for PNA/sepsis requiring mechanical ventilation who had an episode of atrial fibrillation with RVR in the hospital who presents today after missing a "step" in her back yard on a stone and fell on her back. She was found to have pain and limited range of motion of her right leg in the ER with right hip fracture diagnosed. Cardiology consulted given finding of 0.17 point of care troponin. She denies shortness of breath, chest pain or syncope. She was taken off medications because of falls at home which largely resolved until today. She has been seen one time by our HF clinic after a 01/08/14 admission for heart failure with a discharge weight at that time of 108 lbs and today she is 123 lbs (bed weight). She denies significant weight gain or swelling at home. No changes in sleeping, no overt orthopnea/PND.   Past Medical History  Diagnosis Date  . Hypertension   . CHF (congestive heart failure)   . Hyperlipidemia   . Heart murmur   . Shortness of breath   . GERD (gastroesophageal reflux disease)     Past Surgical History  Procedure Laterality Date  . Leg surgery      Family History  Problem Relation Age of Onset  . Hypertension Father   . Breast cancer Daughter     Social History:  reports that she has quit smoking. She has never used smokeless tobacco. She reports that she does not drink alcohol or use illicit drugs.  Allergies: No Known Allergies  Medications:  I have reviewed the patient's current medications. Prior to Admission:  (Not in a hospital admission) Scheduled: .  morphine injection  2 mg Intravenous Once    Results for orders placed or performed during the  hospital encounter of 09/02/14 (from the past 48 hour(s))  CBC with Differential     Status: Abnormal   Collection Time: 09/02/14  6:35 PM  Result Value Ref Range   WBC 5.6 4.0 - 10.5 K/uL   RBC 3.32 (L) 3.87 - 5.11 MIL/uL   Hemoglobin 11.0 (L) 12.0 - 15.0 g/dL   HCT 34.9 (L) 36.0 - 46.0 %   MCV 105.1 (H) 78.0 - 100.0 fL   MCH 33.1 26.0 - 34.0 pg   MCHC 31.5 30.0 - 36.0 g/dL   RDW 14.6 11.5 - 15.5 %   Platelets 369 150 - 400 K/uL   Neutrophils Relative % 87 (H) 43 - 77 %   Neutro Abs 4.9 1.7 - 7.7 K/uL   Lymphocytes Relative 9 (L) 12 - 46 %   Lymphs Abs 0.5 (L) 0.7 - 4.0 K/uL   Monocytes Relative 4 3 - 12 %   Monocytes Absolute 0.2 0.1 - 1.0 K/uL   Eosinophils Relative 0 0 - 5 %   Eosinophils Absolute 0.0 0.0 - 0.7 K/uL   Basophils Relative 0 0 - 1 %   Basophils Absolute 0.0 0.0 - 0.1 K/uL  Basic metabolic panel     Status: Abnormal   Collection Time: 09/02/14  6:35 PM  Result Value Ref Range   Sodium 143 135 - 145 mmol/L   Potassium 3.6 3.5 - 5.1 mmol/L   Chloride 112 (H) 101 - 111 mmol/L  CO2 25 22 - 32 mmol/L   Glucose, Bld 123 (H) 65 - 99 mg/dL   BUN 18 6 - 20 mg/dL   Creatinine, Ser 0.82 0.44 - 1.00 mg/dL   Calcium 8.9 8.9 - 10.3 mg/dL   GFR calc non Af Amer >60 >60 mL/min   GFR calc Af Amer >60 >60 mL/min    Comment: (NOTE) The eGFR has been calculated using the CKD EPI equation. This calculation has not been validated in all clinical situations. eGFR's persistently <60 mL/min signify possible Chronic Kidney Disease.    Anion gap 6 5 - 15  Brain natriuretic peptide     Status: Abnormal   Collection Time: 09/02/14  6:35 PM  Result Value Ref Range   B Natriuretic Peptide 1502.4 (H) 0.0 - 100.0 pg/mL  I-stat troponin, ED     Status: Abnormal   Collection Time: 09/02/14  6:43 PM  Result Value Ref Range   Troponin i, poc 0.17 (HH) 0.00 - 0.08 ng/mL   Comment NOTIFIED PHYSICIAN    Comment 3            Comment: Due to the release kinetics of cTnI, a negative  result within the first hours of the onset of symptoms does not rule out myocardial infarction with certainty. If myocardial infarction is still suspected, repeat the test at appropriate intervals.   Urinalysis, Routine w reflex microscopic (not at [facility])     Status: Abnormal   Collection Time: 09/02/14  8:45 PM  Result Value Ref Range   Color, Urine YELLOW YELLOW   APPearance CLOUDY (A) CLEAR   Specific Gravity, Urine 1.017 1.005 - 1.030   pH 6.0 5.0 - 8.0   Glucose, UA NEGATIVE NEGATIVE mg/dL   Hgb urine dipstick NEGATIVE NEGATIVE   Bilirubin Urine NEGATIVE NEGATIVE   Ketones, ur NEGATIVE NEGATIVE mg/dL   Protein, ur NEGATIVE NEGATIVE mg/dL   Urobilinogen, UA 0.2 0.0 - 1.0 mg/dL   Nitrite NEGATIVE NEGATIVE   Leukocytes, UA MODERATE (A) NEGATIVE  Urine microscopic-add on     Status: Abnormal   Collection Time: 09/02/14  8:45 PM  Result Value Ref Range   Squamous Epithelial / LPF MANY (A) RARE   WBC, UA 3-6 <3 WBC/hpf   Bacteria, UA MANY (A) RARE    Dg Chest 2 View  09/02/2014   CLINICAL DATA:  Fall today.  Hip fx. Htn. Chf  EXAM: CHEST - 2 VIEW  COMPARISON:  07/22/2014  FINDINGS: New interstitial and airspace opacities involving upper lung zones more than bases, right greater than left. Mild cardiomegaly stable. Tortuous atheromatous aorta. No pneumothorax. No effusion. Visualized skeletal structures are unremarkable.  IMPRESSION: 1. New asymmetric infiltrates or edema as above. 2. Stable mild cardiomegaly   Electronically Signed   By: Lucrezia Europe M.D.   On: 09/02/2014 19:50   Dg Hip Unilat  With Pelvis 2-3 Views Right  09/02/2014   CLINICAL DATA:  Pain post fall at home today  EXAM: RIGHT HIP (WITH PELVIS) 2-3 VIEWS  COMPARISON:  06/10/2014  FINDINGS: Midcervical fracture of the right femur, impacted with some displacement and foreshortening. No dislocation. IM rod and sliding pin fixation hardware across the left femoral neck as before. Bony pelvis intact. Degenerative disc disease in  the visualized lower lumbar spine.  IMPRESSION: 1. Displaced midcervical right femur fracture fracture.   Electronically Signed   By: Lucrezia Europe M.D.   On: 09/02/2014 17:30   Dg Femur, Min 2 Views Right  09/02/2014  CLINICAL DATA:  Proximal RIGHT femur fracture.  EXAM: RIGHT FEMUR 2 VIEWS  COMPARISON:  None.  FINDINGS: Distal femur is intact. Tricompartmental osteoarthritis of the knee is incidentally noted.  IMPRESSION: Intact distal femur.   Electronically Signed   By: Dereck Ligas M.D.   On: 09/02/2014 20:17    Review of Systems  Constitutional: Positive for malaise/fatigue. Negative for chills and weight loss.  HENT: Positive for hearing loss. Negative for ear pain.   Eyes: Negative for double vision and photophobia.  Cardiovascular: Negative for chest pain and palpitations.  Gastrointestinal: Negative for nausea and vomiting.  Genitourinary: Negative for dysuria and hematuria.  Musculoskeletal: Positive for joint pain and falls.  Skin: Negative for rash.  Neurological: Negative for dizziness, tingling and headaches.  Endo/Heme/Allergies: Negative for polydipsia. Bruises/bleeds easily.  Psychiatric/Behavioral: Negative for hallucinations and substance abuse.   Blood pressure 156/111, pulse 113, temperature 98.1 F (36.7 C), temperature source Oral, resp. rate 18, height _0  (1.651 m), weight 56.7 kg (125 lb), SpO2 98 %. Physical Exam  Nursing note and vitals reviewed. Constitutional: She is oriented to person, place, and time. She appears well-developed and well-nourished. She appears distressed.  Moderately uncomfortable  HENT:  Head: Normocephalic and atraumatic.  Nose: Nose normal.  Mouth/Throat: Oropharynx is clear and moist. No oropharyngeal exudate.  Eyes: Conjunctivae and EOM are normal. Pupils are equal, round, and reactive to light. No scleral icterus.  Neck: JVD present.  JVP 3 cm above clavicle with slight hJR  Cardiovascular: Normal rate, regular rhythm and intact  distal pulses.  Exam reveals no gallop.   Murmur heard. Soft systolic murmur at LSB  Respiratory: Effort normal. No respiratory distress. She has no wheezes. She has rales.  Scattered rales at bases  GI: Soft. Bowel sounds are normal. She exhibits no distension. There is no tenderness. There is no rebound.  Musculoskeletal: Normal range of motion. She exhibits tenderness. She exhibits no edema.  Neurological: She is alert and oriented to person, place, and time. No cranial nerve deficit.  Skin: Skin is warm and dry. No rash noted. She is not diaphoretic. No erythema.  Psychiatric: She has a normal mood and affect. Her behavior is normal.   Echo reviewed 01/09/14: EF 35-40%, mild AS, inferior septal/apical hypokinesis, moderate/severely dilated LA, PA peak pressure 54 mmHg Labs reviewed; na 143, K 3.6, bun/cr 18/0.82, BNP 1502, Trop 0.17 POC, Trop 1.0 hsTnI Chest x-ray reviewed; ? Right > left infiltrate vs. Fluid ECG reviewed; unchanged from previous, inferior infarct age indeterminate, SR, nonspecific ST changes inferolaterally Assessment/Plan: Traci Hale is an 81 yo woman with PMH of hypertension, dyslipidemia GERD and hospitalization from 3/9-3/17 for PNA/sepsis requiring mechanical ventilation who had an episode of atrial fibrillation with RVR in the hospital who presents today after missing a mechanical fall with right hip fracture and elevated troponin. She's had troponins of ~ 0.4 with previous hospitalizations. Differential diagnosis for elevated troponin includes demand ischemia from other stress event (fall), neurologic disease, acute renal failure, among other etiologies. I favor a diagnosis of demand ischemia in setting of mechanical fall and/or other stressors. Given mildly elevated troponin, there is likely some level of underlying CAD; however, the flat troponins and clinical course are not consistent with plaque rupture. Regarding cardiac clearance evaluation; Traci Hale has  2 RCRI risk factor (history of HF, some volume on exam and pathological Qs on ECG) leading to estimated risk of 6.6% for having a major cardiovascular adverse event. However, she is older  at 87 and this is a larger risk factor. She is overall moderate risk for an intermediate (orthopaedic procedure) risk surgery. For now, would continue to trend troponins, trial gentle IV diuresis.   Problem List 1. Right hip fracture 2. Elevated troponin/NSTEMI - favor Type II/demand ischemia  3. Chronic systolic heart failure 4. Hypertension Plan - trend cardiac markers - IV lasix bid x 2 doses, first dose 20 mg, if limited response, increase to 40 mg - observation on telemetry - aggressive pulmonary toilet post-surgery, SCDs/DVT prophylaxis, Incentive spirometry  - careful with excess volume administration during surgery given known LV dysfunction and hypervolemia on exam   Traci Hale 09/02/2014, 10:09 PM

## 2014-09-02 NOTE — Consult Note (Signed)
Reason for Consult:  Right hip fracture Referring Physician:   EDP  Traci Hale is an 81 y.o. female.  HPI:   81 yo female who lives with family sustained an accidental fall at home earlier this evening.  After significant right hip pain and the inability to ambulate, she was brought to [facility] ED and found to have a right hip displaced femoral neck fracture.  She complains of significant right hip pain and denies other injuries.  Past Medical History  Diagnosis Date  . Hypertension   . CHF (congestive heart failure)   . Hyperlipidemia   . Heart murmur   . Shortness of breath   . GERD (gastroesophageal reflux disease)     Past Surgical History  Procedure Laterality Date  . Leg surgery      Family History  Problem Relation Age of Onset  . Hypertension Father   . Breast cancer Daughter     Social History:  reports that she has quit smoking. She has never used smokeless tobacco. She reports that she does not drink alcohol or use illicit drugs.  Allergies: No Known Allergies  Medications: I have reviewed the patient's current medications.  Results for orders placed or performed during the hospital encounter of 09/02/14 (from the past 48 hour(s))  CBC with Differential     Status: Abnormal   Collection Time: 09/02/14  6:35 PM  Result Value Ref Range   WBC 5.6 4.0 - 10.5 K/uL   RBC 3.32 (L) 3.87 - 5.11 MIL/uL   Hemoglobin 11.0 (L) 12.0 - 15.0 g/dL   HCT 34.9 (L) 36.0 - 46.0 %   MCV 105.1 (H) 78.0 - 100.0 fL   MCH 33.1 26.0 - 34.0 pg   MCHC 31.5 30.0 - 36.0 g/dL   RDW 14.6 11.5 - 15.5 %   Platelets 369 150 - 400 K/uL   Neutrophils Relative % 87 (H) 43 - 77 %   Neutro Abs 4.9 1.7 - 7.7 K/uL   Lymphocytes Relative 9 (L) 12 - 46 %   Lymphs Abs 0.5 (L) 0.7 - 4.0 K/uL   Monocytes Relative 4 3 - 12 %   Monocytes Absolute 0.2 0.1 - 1.0 K/uL   Eosinophils Relative 0 0 - 5 %   Eosinophils Absolute 0.0 0.0 - 0.7 K/uL   Basophils Relative 0 0 - 1 %   Basophils Absolute  0.0 0.0 - 0.1 K/uL  Basic metabolic panel     Status: Abnormal   Collection Time: 09/02/14  6:35 PM  Result Value Ref Range   Sodium 143 135 - 145 mmol/L   Potassium 3.6 3.5 - 5.1 mmol/L   Chloride 112 (H) 101 - 111 mmol/L   CO2 25 22 - 32 mmol/L   Glucose, Bld 123 (H) 65 - 99 mg/dL   BUN 18 6 - 20 mg/dL   Creatinine, Ser 0.82 0.44 - 1.00 mg/dL   Calcium 8.9 8.9 - 10.3 mg/dL   GFR calc non Af Amer >60 >60 mL/min   GFR calc Af Amer >60 >60 mL/min    Comment: (NOTE) The eGFR has been calculated using the CKD EPI equation. This calculation has not been validated in all clinical situations. eGFR's persistently <60 mL/min signify possible Chronic Kidney Disease.    Anion gap 6 5 - 15  Brain natriuretic peptide     Status: Abnormal   Collection Time: 09/02/14  6:35 PM  Result Value Ref Range   B Natriuretic Peptide 1502.4 (H) 0.0 -  100.0 pg/mL  I-stat troponin, ED     Status: Abnormal   Collection Time: 09/02/14  6:43 PM  Result Value Ref Range   Troponin i, poc 0.17 (HH) 0.00 - 0.08 ng/mL   Comment NOTIFIED PHYSICIAN    Comment 3            Comment: Due to the release kinetics of cTnI, a negative result within the first hours of the onset of symptoms does not rule out myocardial infarction with certainty. If myocardial infarction is still suspected, repeat the test at appropriate intervals.   Urinalysis, Routine w reflex microscopic (not at [facility])     Status: Abnormal   Collection Time: 09/02/14  8:45 PM  Result Value Ref Range   Color, Urine YELLOW YELLOW   APPearance CLOUDY (A) CLEAR   Specific Gravity, Urine 1.017 1.005 - 1.030   pH 6.0 5.0 - 8.0   Glucose, UA NEGATIVE NEGATIVE mg/dL   Hgb urine dipstick NEGATIVE NEGATIVE   Bilirubin Urine NEGATIVE NEGATIVE   Ketones, ur NEGATIVE NEGATIVE mg/dL   Protein, ur NEGATIVE NEGATIVE mg/dL   Urobilinogen, UA 0.2 0.0 - 1.0 mg/dL   Nitrite NEGATIVE NEGATIVE   Leukocytes, UA MODERATE (A) NEGATIVE  Urine microscopic-add on      Status: Abnormal   Collection Time: 09/02/14  8:45 PM  Result Value Ref Range   Squamous Epithelial / LPF MANY (A) RARE   WBC, UA 3-6 <3 WBC/hpf   Bacteria, UA MANY (A) RARE    Dg Chest 2 View  09/02/2014   CLINICAL DATA:  Fall today.  Hip fx. Htn. Chf  EXAM: CHEST - 2 VIEW  COMPARISON:  07/22/2014  FINDINGS: New interstitial and airspace opacities involving upper lung zones more than bases, right greater than left. Mild cardiomegaly stable. Tortuous atheromatous aorta. No pneumothorax. No effusion. Visualized skeletal structures are unremarkable.  IMPRESSION: 1. New asymmetric infiltrates or edema as above. 2. Stable mild cardiomegaly   Electronically Signed   By: Lucrezia Europe M.D.   On: 09/02/2014 19:50   Dg Hip Unilat  With Pelvis 2-3 Views Right  09/02/2014   CLINICAL DATA:  Pain post fall at home today  EXAM: RIGHT HIP (WITH PELVIS) 2-3 VIEWS  COMPARISON:  06/10/2014  FINDINGS: Midcervical fracture of the right femur, impacted with some displacement and foreshortening. No dislocation. IM rod and sliding pin fixation hardware across the left femoral neck as before. Bony pelvis intact. Degenerative disc disease in the visualized lower lumbar spine.  IMPRESSION: 1. Displaced midcervical right femur fracture fracture.   Electronically Signed   By: Lucrezia Europe M.D.   On: 09/02/2014 17:30   Dg Femur, Min 2 Views Right  09/02/2014   CLINICAL DATA:  Proximal RIGHT femur fracture.  EXAM: RIGHT FEMUR 2 VIEWS  COMPARISON:  None.  FINDINGS: Distal femur is intact. Tricompartmental osteoarthritis of the knee is incidentally noted.  IMPRESSION: Intact distal femur.   Electronically Signed   By: Dereck Ligas M.D.   On: 09/02/2014 20:17    ROS Blood pressure 156/111, pulse 113, temperature 98.1 F (36.7 C), temperature source Oral, resp. rate 18, height 5' 5" (1.651 m), weight 56.7 kg (125 lb), SpO2 98 %. Physical Exam  Constitutional: She is oriented to person, place, and time. She appears well-developed  and well-nourished.  HENT:  Head: Normocephalic and atraumatic.  Eyes: Pupils are equal, round, and reactive to light.  Neck: Normal range of motion. Neck supple.  Cardiovascular: Normal rate.   Respiratory:  Effort normal and breath sounds normal.  GI: Soft. Bowel sounds are normal.  Musculoskeletal:       Right hip: She exhibits decreased range of motion, decreased strength, bony tenderness and deformity.  Neurological: She is oriented to person, place, and time.  Skin: Skin is warm and dry.  Psychiatric: She has a normal mood and affect.   Her right hip is significantly shortened and rotated. Her skin over her right hip is intact. Her right foot is well-perfused and she moves her foot and toes easily   Assessment/Plan: Displaced right hip femoral neck fracture status-post mechanical fall 1)  I spoke to her and her family in length at the bedside and they understand the recommendation of a right hip hemiarthroplasty to treat her displaced right hip fracture.  This will certainly decrease her pain and improve her function as well as quality of life.  She understands this fully given her history of a left hip fracture several years ago that also required surgery.  Apparently, she has slightly elevated troponin levels and will need Cardiac clearance prior to surgery.  General Medicine will be admitting her graciously for medical management.  I did discuss the risks of surgery in detail.  I plan on surgery late tomorrow pending clearance.  I originally wanted her transferred to [facility], but instead can have her stay here at [facility] for her surgery.  Mcarthur Rossetti 09/02/2014, 9:54 PM

## 2014-09-02 NOTE — ED Provider Notes (Signed)
CSN: [id number]     Arrival date & time 09/02/14  1619 History   First MD Initiated Contact with Patient 09/02/14 1626     Chief Complaint  Patient presents with  . Fall  . Hip Pain     (Consider location/radiation/quality/duration/timing/severity/associated sxs/prior Treatment) HPI Anwar Crill is an 81 year old female with past medical history of hypertension, CHF, hyperlipidemia, GERD who presents the ER complaining of a mechanical fall. Patient states she is walking in her backyard when she "missed a step" on one of the walking stones in her yard, and fell. Patient states she tripped backwards, landing on her "backside". Patient denies syncope, loss of consciousness. Patient complains of right hip pain immediately after the incident. Patient reports pain with any range of motion of her right leg. Patient called for assistance, was helped by neighbor, who called EMS. Patient is brought in by EMS, hips were splinted with a sheet tie. Patient denies numbness, weakness, tingling, loss of sensation or function. Patient denies headache, blurred vision, dizziness, weakness, chest pain, shortness of breath, nausea, vomiting, abdominal pain.  Past Medical History  Diagnosis Date  . Hypertension   . CHF (congestive heart failure)   . Hyperlipidemia   . Heart murmur   . Shortness of breath   . GERD (gastroesophageal reflux disease)    Past Surgical History  Procedure Laterality Date  . Leg surgery     Family History  Problem Relation Age of Onset  . Hypertension Father   . Breast cancer Daughter    History  Substance Use Topics  . Smoking status: Former Games developer  . Smokeless tobacco: Never Used     Comment: quit smoking in the 80"s   . Alcohol Use: No   OB History    No data available     Review of Systems  Constitutional: Negative for fever.  HENT: Negative for trouble swallowing.   Eyes: Negative for visual disturbance.  Respiratory: Negative for shortness of breath.    Cardiovascular: Negative for chest pain.  Gastrointestinal: Negative for nausea, vomiting and abdominal pain.  Genitourinary: Negative for dysuria.  Musculoskeletal: Positive for arthralgias. Negative for neck pain.  Skin: Negative for rash.  Neurological: Negative for dizziness, weakness and numbness.  Psychiatric/Behavioral: Negative.       Allergies  Review of patient's allergies indicates no known allergies.  Home Medications   Prior to Admission medications   Medication Sig Start Date End Date Taking? Authorizing Provider  acetaminophen (TYLENOL) 325 MG tablet Take 2 tablets (650 mg total) by mouth every 6 (six) hours as needed for mild pain, moderate pain, fever or headache. Patient not taking: Reported on 09/02/2014 06/18/14   Joseph Art, DO  amiodarone (PACERONE) 200 MG tablet Take 1 tablet (200 mg total) by mouth daily. Patient not taking: Reported on 09/02/2014 06/18/14   Joseph Art, DO  amoxicillin-clavulanate (AUGMENTIN) 500-125 MG per tablet Take 1 tablet (500 mg total) by mouth 3 (three) times daily. Patient not taking: Reported on 09/02/2014 06/18/14   Joseph Art, DO  aspirin 325 MG tablet Take 1 tablet (325 mg total) by mouth daily. Patient not taking: Reported on 09/02/2014 06/18/14   Joseph Art, DO  bisoprolol (ZEBETA) 5 MG tablet Take 0.5 tablets (2.5 mg total) by mouth daily. Patient not taking: Reported on 09/02/2014 06/18/14   Joseph Art, DO  Calcium Carbonate-Vitamin D (CALCIUM 600+D) 600-200 MG-UNIT TABS Take 1 tablet by mouth 2 (two) times daily.    Historical Provider,  MD  Cholecalciferol (VITAMIN D-3) 5000 UNITS TABS Take 1 tablet by mouth daily.    Historical Provider, MD  feeding supplement, RESOURCE BREEZE, (RESOURCE BREEZE) LIQD Take 1 Container by mouth 3 (three) times daily between meals. Patient not taking: Reported on 09/02/2014 06/18/14   Joseph Art, DO  Folic Acid-Vit B6-Vit B12 (FOLGARD) 0.8-10-0.115 MG TABS Take 1 tablet by mouth daily.     Historical Provider, MD  furosemide (LASIX) 20 MG tablet Take 1 tablet (20 mg total) by mouth daily. Patient not taking: Reported on 09/02/2014 06/18/14   Joseph Art, DO  ipratropium-albuterol (DUONEB) 0.5-2.5 (3) MG/3ML SOLN Take 3 mLs by nebulization every 4 (four) hours as needed. Patient not taking: Reported on 09/02/2014 06/18/14   Joseph Art, DO  NIFEdipine (ADALAT CC) 90 MG 24 hr tablet Take 1 tablet by mouth daily. 06/05/14   Historical Provider, MD  Omega 3-6-9 Fatty Acids (OMEGA-3 FUSION) LIQD Take 5 mLs by mouth daily.  omega 3 liquid    Historical Provider, MD  Probiotic Product (PROBIOTIC DAILY PO) Take 1 capsule by mouth daily.    Historical Provider, MD  simvastatin (ZOCOR) 20 MG tablet Take 20 mg by mouth daily.    Historical Provider, MD   BP 153/96 mmHg  Pulse 100  Temp(Src) 98 F (36.7 C) (Oral)  Resp 24  Ht  (1.651 m)  Wt 123 lb 10.9 oz (56.1 kg)  BMI 20.58 kg/m2  SpO2 91% Physical Exam  Constitutional: She is oriented to person, place, and time. She appears well-developed and well-nourished. No distress.  HENT:  Head: Normocephalic and atraumatic.  Mouth/Throat: Oropharynx is clear and moist. No oropharyngeal exudate.  Eyes: Right eye exhibits no discharge. Left eye exhibits no discharge. No scleral icterus.  Neck: Normal range of motion.  Cardiovascular: Normal rate, regular rhythm and normal heart sounds.   No murmur heard. Pulmonary/Chest: Effort normal and breath sounds normal. No respiratory distress.  Abdominal: Soft. There is no tenderness.  Musculoskeletal: Normal range of motion. She exhibits no edema or tenderness.  Mild shortening and rotation of right hip. No obvious deformity to palpation. Patient has limited range of motion due to pain. Motor strength unable to assess due to pain. DP pulse 2+. Distal sensation intact.  Neurological: She is alert and oriented to person, place, and time. No cranial nerve deficit or sensory deficit.  Coordination normal. GCS eye subscore is 4. GCS verbal subscore is 5. GCS motor subscore is 6.  Patient is fully alert, answering questions appropriately in full, clear sentences. Radial nerves II through XII grossly intact. Motor strength in both upper and left lower extremity 5 out of 5. Unable to assess motor strength in right lower extremity fully due to injury. Distal sensation intact.  Skin: Skin is warm and dry. No rash noted. She is not diaphoretic.  Psychiatric: She has a normal mood and affect.  Nursing note and vitals reviewed.   ED Course  Procedures (including critical care time) Labs Review Labs Reviewed  CBC WITH DIFFERENTIAL/PLATELET - Abnormal; Notable for the following:    RBC 3.32 (*)    Hemoglobin 11.0 (*)    HCT 34.9 (*)    MCV 105.1 (*)    Neutrophils Relative % 87 (*)    Lymphocytes Relative 9 (*)    Lymphs Abs 0.5 (*)    All other components within normal limits  BASIC METABOLIC PANEL - Abnormal; Notable for the following:    Chloride 112 (*)  Glucose, Bld 123 (*)    All other components within normal limits  URINALYSIS, ROUTINE W REFLEX MICROSCOPIC (NOT AT [facility]) - Abnormal; Notable for the following:    APPearance CLOUDY (*)    Leukocytes, UA MODERATE (*)    All other components within normal limits  BRAIN NATRIURETIC PEPTIDE - Abnormal; Notable for the following:    B Natriuretic Peptide 1502.4 (*)    All other components within normal limits  URINE MICROSCOPIC-ADD ON - Abnormal; Notable for the following:    Squamous Epithelial / LPF MANY (*)    Bacteria, UA MANY (*)    All other components within normal limits  TROPONIN I - Abnormal; Notable for the following:    Troponin I 1.03 (*)    All other components within normal limits  I-STAT TROPOININ, ED - Abnormal; Notable for the following:    Troponin i, poc 0.17 (*)    All other components within normal limits  TROPONIN I  TROPONIN I  TROPONIN I  COMPREHENSIVE METABOLIC PANEL  CBC WITH  DIFFERENTIAL/PLATELET  PROTIME-INR  CBC  PROCALCITONIN  TYPE AND SCREEN    Imaging Review Dg Chest 2 View  09/02/2014   CLINICAL DATA:  Fall today.  Hip fx. Htn. Chf  EXAM: CHEST - 2 VIEW  COMPARISON:  07/22/2014  FINDINGS: New interstitial and airspace opacities involving upper lung zones more than bases, right greater than left. Mild cardiomegaly stable. Tortuous atheromatous aorta. No pneumothorax. No effusion. Visualized skeletal structures are unremarkable.  IMPRESSION: 1. New asymmetric infiltrates or edema as above. 2. Stable mild cardiomegaly   Electronically Signed   By: Corlis Leak M.D.   On: 09/02/2014 19:50   Dg Hip Unilat  With Pelvis 2-3 Views Right  09/02/2014   CLINICAL DATA:  Pain post fall at home today  EXAM: RIGHT HIP (WITH PELVIS) 2-3 VIEWS  COMPARISON:  06/10/2014  FINDINGS: Midcervical fracture of the right femur, impacted with some displacement and foreshortening. No dislocation. IM rod and sliding pin fixation hardware across the left femoral neck as before. Bony pelvis intact. Degenerative disc disease in the visualized lower lumbar spine.  IMPRESSION: 1. Displaced midcervical right femur fracture fracture.   Electronically Signed   By: Corlis Leak M.D.   On: 09/02/2014 17:30   Dg Femur, Min 2 Views Right  09/02/2014   CLINICAL DATA:  Proximal RIGHT femur fracture.  EXAM: RIGHT FEMUR 2 VIEWS  COMPARISON:  None.  FINDINGS: Distal femur is intact. Tricompartmental osteoarthritis of the knee is incidentally noted.  IMPRESSION: Intact distal femur.   Electronically Signed   By: Andreas Newport M.D.   On: 09/02/2014 20:17     EKG Interpretation   Date/Time:  Wednesday September 02 2014 18:32:00 EDT Ventricular Rate:  104 PR Interval:  150 QRS Duration: 90 QT Interval:  422 QTC Calculation: 555 R Axis:   91 Text Interpretation:  Sinus tachycardia Atrial premature complex Consider  left ventricular hypertrophy Probable inferior infarct, age indeterminate  Abnrm T, consider  ischemia, anterolateral lds Prolonged QT interval  Confirmed by Erroll Luna [phone]) on 09/02/2014 6:35:34 PM      CRITICAL CARE Performed by: Monte Fantasia   Total critical care time: 75  Critical care time was exclusive of separately billable procedures and treating other patients.  Critical care was necessary to treat or prevent imminent or life-threatening deterioration.  Critical care was time spent personally by me on the following activities: development of treatment plan with patient and/or  surrogate as well as nursing, discussions with consultants, evaluation of patient's response to treatment, examination of patient, obtaining history from patient or surrogate, ordering and performing treatments and interventions, ordering and review of laboratory studies, ordering and review of radiographic studies, pulse oximetry and re-evaluation of patient's condition.   MDM   Final diagnoses:  Fall  Femoral neck fracture    7:50 PM: Spoke with Dr. Winona LegatoSwintec with orthopedics in consultation regarding patient's hip fracture, Ortho recommends full femur x-rays, will follow up with patient in a.m. after cards consult.  Cardiology consult placed due to elevated troponin. Patient continued to deny chest pain, reports mild shortness of breath, which she states is baseline for her.  Spoke with Dr. Tresa EndoKelly with cardiology regarding patient's elevated troponin, cardiology recommends trending troponins, however do not recommend anticoagulation or intervention on this at this time with patient denying any chest pain or shortness of breath symptoms.  Spoke with Dr. Magnus IvanBlackman who is on-call for Dr. Zalika CharterYates office due to Dr. Allea CharterYates performing hip ORIF in the past, Dr. Magnus IvanBlackman [facility], orthopedics will see patient in consultation. Orthopedics recommends admission to medicine for further observation, trending of troponins, recommends cardiology clearance before patient  is able to go to surgery.  Patient noted to be tachypneic, continues to deny any shortness of breath, chest pain after multiple exams. [place] x-ray unremarkable for upper lobe infiltrate, consistent with possible pneumonia.  The patient history CHF, evaluation of previous x-rays just does look like a new possible community-acquired pneumonia.  Patient admitted to medicine for further workup for community-acquired pneumonia, hip fracture. Orthopedics and cardiology to consult tomorrow.  BP 153/96 mmHg  Pulse 100  Temp(Src) 98 F (36.7 C) (Oral)  Resp 24  Ht 5\' 5"  (1.651 m)  Wt 123 lb 10.9 oz (56.1 kg)  BMI 20.58 kg/m2  SpO2 91%  Signed,  Ladona MowJoe Yanil Dawe, PA-C 1:40 AM   Ladona MowJoe Tevion Laforge, PA-C 09/03/14 0140  Tomasita CrumbleAdeleke Oni, MD 09/03/14 2219

## 2014-09-02 NOTE — ED Notes (Signed)
[facility] presents with a 81 yo female from home who fell and has right hip pain from running from bees today at approximately 2:30 pm.  Pt was found on ground in backyard by neighbor who carried patient into her house and called for medical assistance.  Pt has hx of HTN, CHF and pneumonia; however, is not taking related medications because, per family, patient was taken off of medications because she was falling from them.  Patient was given 50 mcg of fentanyl in route to this facility for 10/10 right hip pain.  Patient is not in any pain at this time.  NKDA.  No LOC after fall, no obvious deformities.

## 2014-09-03 ENCOUNTER — Inpatient Hospital Stay (HOSPITAL_COMMUNITY): Payer: Commercial Managed Care - HMO

## 2014-09-03 ENCOUNTER — Encounter (HOSPITAL_COMMUNITY): Payer: Self-pay | Admitting: Internal Medicine

## 2014-09-03 DIAGNOSIS — W19XXXA Unspecified fall, initial encounter: Secondary | ICD-10-CM | POA: Diagnosis present

## 2014-09-03 DIAGNOSIS — W19XXXD Unspecified fall, subsequent encounter: Secondary | ICD-10-CM

## 2014-09-03 DIAGNOSIS — J9601 Acute respiratory failure with hypoxia: Secondary | ICD-10-CM

## 2014-09-03 DIAGNOSIS — R778 Other specified abnormalities of plasma proteins: Secondary | ICD-10-CM | POA: Diagnosis present

## 2014-09-03 DIAGNOSIS — I5022 Chronic systolic (congestive) heart failure: Secondary | ICD-10-CM

## 2014-09-03 DIAGNOSIS — Z5111 Encounter for antineoplastic chemotherapy: Secondary | ICD-10-CM

## 2014-09-03 DIAGNOSIS — S72009A Fracture of unspecified part of neck of unspecified femur, initial encounter for closed fracture: Secondary | ICD-10-CM | POA: Diagnosis present

## 2014-09-03 DIAGNOSIS — R7989 Other specified abnormal findings of blood chemistry: Secondary | ICD-10-CM

## 2014-09-03 LAB — CBC WITH DIFFERENTIAL/PLATELET
Basophils Absolute: 0 10*3/uL (ref 0.0–0.1)
Basophils Relative: 0 % (ref 0–1)
EOS ABS: 0 10*3/uL (ref 0.0–0.7)
Eosinophils Relative: 0 % (ref 0–5)
HCT: 36.6 % (ref 36.0–46.0)
HEMOGLOBIN: 11.8 g/dL — AB (ref 12.0–15.0)
LYMPHS ABS: 0.8 10*3/uL (ref 0.7–4.0)
Lymphocytes Relative: 14 % (ref 12–46)
MCH: 33.9 pg (ref 26.0–34.0)
MCHC: 32.2 g/dL (ref 30.0–36.0)
MCV: 105.2 fL — AB (ref 78.0–100.0)
Monocytes Absolute: 0.4 10*3/uL (ref 0.1–1.0)
Monocytes Relative: 8 % (ref 3–12)
NEUTROS ABS: 4.3 10*3/uL (ref 1.7–7.7)
Neutrophils Relative %: 78 % — ABNORMAL HIGH (ref 43–77)
PLATELETS: 386 10*3/uL (ref 150–400)
RBC: 3.48 MIL/uL — ABNORMAL LOW (ref 3.87–5.11)
RDW: 14.7 % (ref 11.5–15.5)
WBC: 5.5 10*3/uL (ref 4.0–10.5)

## 2014-09-03 LAB — COMPREHENSIVE METABOLIC PANEL
ALT: 12 U/L — AB (ref 14–54)
ANION GAP: 8 (ref 5–15)
AST: 33 U/L (ref 15–41)
Albumin: 3.4 g/dL — ABNORMAL LOW (ref 3.5–5.0)
Alkaline Phosphatase: 62 U/L (ref 38–126)
BUN: 19 mg/dL (ref 6–20)
CHLORIDE: 109 mmol/L (ref 101–111)
CO2: 24 mmol/L (ref 22–32)
CREATININE: 0.84 mg/dL (ref 0.44–1.00)
Calcium: 8.9 mg/dL (ref 8.9–10.3)
Glucose, Bld: 154 mg/dL — ABNORMAL HIGH (ref 65–99)
POTASSIUM: 4.1 mmol/L (ref 3.5–5.1)
Sodium: 141 mmol/L (ref 135–145)
Total Bilirubin: 0.4 mg/dL (ref 0.3–1.2)
Total Protein: 8.3 g/dL — ABNORMAL HIGH (ref 6.5–8.1)

## 2014-09-03 LAB — PROTIME-INR
INR: 1.22 (ref 0.00–1.49)
Prothrombin Time: 15.5 seconds — ABNORMAL HIGH (ref 11.6–15.2)

## 2014-09-03 LAB — GLUCOSE, CAPILLARY
GLUCOSE-CAPILLARY: 115 mg/dL — AB (ref 65–99)
Glucose-Capillary: 123 mg/dL — ABNORMAL HIGH (ref 65–99)
Glucose-Capillary: 123 mg/dL — ABNORMAL HIGH (ref 65–99)

## 2014-09-03 LAB — TYPE AND SCREEN
ABO/RH(D): A POS
ANTIBODY SCREEN: NEGATIVE

## 2014-09-03 LAB — PROCALCITONIN

## 2014-09-03 LAB — TROPONIN I
TROPONIN I: 1.03 ng/mL — AB (ref <0.031)
Troponin I: 2.14 ng/mL (ref <0.031)
Troponin I: 5.87 ng/mL (ref <0.031)
Troponin I: 6.09 ng/mL (ref <0.031)

## 2014-09-03 MED ORDER — SIMVASTATIN 20 MG PO TABS
20.0000 mg | ORAL_TABLET | Freq: Every day | ORAL | Status: DC
  Administered 2014-09-03 – 2014-09-08 (×6): 20 mg via ORAL
  Filled 2014-09-03: qty 2
  Filled 2014-09-03 (×3): qty 1
  Filled 2014-09-03: qty 2
  Filled 2014-09-03 (×2): qty 1

## 2014-09-03 MED ORDER — NIFEDIPINE ER OSMOTIC RELEASE 90 MG PO TB24
90.0000 mg | ORAL_TABLET | Freq: Every day | ORAL | Status: DC
  Filled 2014-09-03: qty 1

## 2014-09-03 MED ORDER — FUROSEMIDE 10 MG/ML IJ SOLN
20.0000 mg | Freq: Once | INTRAMUSCULAR | Status: AC
  Administered 2014-09-03: 20 mg via INTRAVENOUS
  Filled 2014-09-03: qty 2

## 2014-09-03 MED ORDER — NIFEDIPINE ER 60 MG PO TB24
60.0000 mg | ORAL_TABLET | Freq: Every day | ORAL | Status: DC
  Administered 2014-09-04 – 2014-09-09 (×5): 60 mg via ORAL
  Filled 2014-09-03 (×6): qty 1

## 2014-09-03 MED ORDER — FA-PYRIDOXINE-CYANOCOBALAMIN 2.5-25-2 MG PO TABS
1.0000 | ORAL_TABLET | Freq: Every day | ORAL | Status: DC
  Administered 2014-09-03 – 2014-09-09 (×6): 1 via ORAL
  Filled 2014-09-03 (×7): qty 1

## 2014-09-03 MED ORDER — OMEGA-3-ACID ETHYL ESTERS 1 G PO CAPS
1.0000 | ORAL_CAPSULE | Freq: Every day | ORAL | Status: DC
  Administered 2014-09-03 – 2014-09-09 (×6): 1 g via ORAL
  Filled 2014-09-03 (×7): qty 1

## 2014-09-03 MED ORDER — VITAMIN D 1000 UNITS PO TABS
5000.0000 [IU] | ORAL_TABLET | Freq: Every day | ORAL | Status: DC
  Administered 2014-09-03 – 2014-09-09 (×6): 5000 [IU] via ORAL
  Filled 2014-09-03 (×6): qty 5

## 2014-09-03 MED ORDER — DEXTROSE 5 % IV SOLN
2.0000 g | Freq: Two times a day (BID) | INTRAVENOUS | Status: DC
  Administered 2014-09-03 – 2014-09-06 (×8): 2 g via INTRAVENOUS
  Filled 2014-09-03 (×10): qty 2

## 2014-09-03 MED ORDER — LISINOPRIL 2.5 MG PO TABS
2.5000 mg | ORAL_TABLET | Freq: Every day | ORAL | Status: DC
  Administered 2014-09-03 – 2014-09-09 (×6): 2.5 mg via ORAL
  Filled 2014-09-03 (×7): qty 1

## 2014-09-03 MED ORDER — FUROSEMIDE 10 MG/ML IJ SOLN
40.0000 mg | Freq: Two times a day (BID) | INTRAMUSCULAR | Status: DC
  Administered 2014-09-03 (×2): 40 mg via INTRAVENOUS
  Filled 2014-09-03 (×2): qty 4

## 2014-09-03 MED ORDER — CARVEDILOL 3.125 MG PO TABS
3.1250 mg | ORAL_TABLET | Freq: Two times a day (BID) | ORAL | Status: DC
  Administered 2014-09-03 – 2014-09-09 (×11): 3.125 mg via ORAL
  Filled 2014-09-03 (×11): qty 1

## 2014-09-03 MED ORDER — HYDROCODONE-ACETAMINOPHEN 5-325 MG PO TABS
1.0000 | ORAL_TABLET | Freq: Four times a day (QID) | ORAL | Status: DC | PRN
  Administered 2014-09-04: 2 via ORAL
  Administered 2014-09-04 (×2): 1 via ORAL
  Administered 2014-09-05 – 2014-09-06 (×4): 2 via ORAL
  Administered 2014-09-07 – 2014-09-09 (×6): 1 via ORAL
  Filled 2014-09-03 (×2): qty 2
  Filled 2014-09-03 (×3): qty 1
  Filled 2014-09-03: qty 2
  Filled 2014-09-03 (×3): qty 1
  Filled 2014-09-03 (×2): qty 2
  Filled 2014-09-03: qty 1
  Filled 2014-09-03: qty 2

## 2014-09-03 MED ORDER — VANCOMYCIN HCL 500 MG IV SOLR
500.0000 mg | Freq: Two times a day (BID) | INTRAVENOUS | Status: DC
  Administered 2014-09-03 – 2014-09-07 (×9): 500 mg via INTRAVENOUS
  Filled 2014-09-03 (×10): qty 500

## 2014-09-03 MED ORDER — MORPHINE SULFATE 2 MG/ML IJ SOLN
0.5000 mg | INTRAMUSCULAR | Status: DC | PRN
  Administered 2014-09-03 – 2014-09-09 (×11): 0.5 mg via INTRAVENOUS
  Filled 2014-09-03 (×11): qty 1

## 2014-09-03 MED ORDER — HYDRALAZINE HCL 20 MG/ML IJ SOLN
5.0000 mg | INTRAMUSCULAR | Status: DC | PRN
  Administered 2014-09-03 – 2014-09-06 (×2): 5 mg via INTRAVENOUS
  Filled 2014-09-03 (×2): qty 1

## 2014-09-03 NOTE — Clinical Social Work Placement (Signed)
   CLINICAL SOCIAL WORK PLACEMENT  NOTE  Date:  09/03/2014  Patient Details  Name: Traci Hale MRN: [id number] Date of Birth: July 19, 1924  Clinical Social Work is seeking post-discharge placement for this patient at the Skilled  Nursing Facility level of care (*CSW will initial, date and re-position this form in  chart as items are completed):  Yes   Patient/family provided with [facility] Clinical Social Work Department's list of facilities offering this level of care within the geographic area requested by the patient (or if unable, by the patient's family).  Yes   Patient/family informed of their freedom to choose among providers that offer the needed level of care, that participate in Medicare, Medicaid or managed care program needed by the patient, have an available bed and are willing to accept the patient.  Yes   Patient/family informed of [facility]'s ownership interest in [facility] and [facility], as well as of the fact that they are under no obligation to receive care at these facilities.  PASRR submitted to EDS on 09/03/14     PASRR number received on       Existing PASRR number confirmed on 09/03/14     FL2 transmitted to all facilities in geographic area requested by pt/family on 09/03/14     FL2 transmitted to all facilities within larger geographic area on       Patient informed that his/her managed care company has contracts with or will negotiate with certain facilities, including the following:            Patient/family informed of bed offers received.  Patient chooses bed at       Physician recommends and patient chooses bed at      Patient to be transferred to   on  .  Patient to be transferred to facility by       Patient family notified on   of transfer.  Name of family member notified:        PHYSICIAN       Additional Comment:    _______________________________________________ Arlyss RepressHarrison, Trinidad Ingle F, LCSW 09/03/2014, 3:55 PM

## 2014-09-03 NOTE — Progress Notes (Signed)
Initial Nutrition Assessment  DOCUMENTATION CODES:  Severe malnutrition in context of chronic illness  INTERVENTION: - Will order nutrition supplements with diet advancement - RD to continue to monitor for needs  NUTRITION DIAGNOSIS:  Inadequate oral intake related to inability to eat as evidenced by NPO status.  GOAL:  Patient will meet greater than or equal to 90% of their needs  MONITOR:  Diet advancement, Weight trends, Labs, I & O's  REASON FOR ASSESSMENT:  Consult Hip fracture protocol  ASSESSMENT: Per H&P, 81 y.o. female with history of chronic systolic he4art failure last year measured was 35-40%, hypertension, chronic anemia, dyslipidemia was brought to the ER after patient had a fall at the backyard of her house.   Pt seen for consult. BMI indicates normal weight status. Per weight hx review, pt has lost 4 lbs (3% body weight) in 3 months which is not significant for time frame. She has been NPO since admission. Pending surgery with need for troponin to resolve prior to surgery, per rounds this AM.  During visit with pt, it was noted that she does not have any teeth; unsure at this time if she has dentures. When asked about appetite and weight trends PTA pt reported that she was unsure, that she was sleepy, and re-iterated a few times that she broke her R hip.  Pt unable to meet needs at this time. Will order appropriate supplements with diet advancement. Medications reviewed; Lasix, 5000 units vitamin D/day. Labs reviewed.  Height:  Ht Readings from Last 1 Encounters:  09/02/14 5\' 5"  (1.651 m)    Weight:  Wt Readings from Last 1 Encounters:  09/03/14 125 lb 14.1 oz (57.1 kg)    Ideal Body Weight:  56.8 kg (kg)  Wt Readings from Last 10 Encounters:  09/03/14 125 lb 14.1 oz (57.1 kg)  06/12/14 129 lb 3 oz (58.6 kg)  01/10/14 108 lb 15.2 oz (49.42 kg)    BMI:  Body mass index is 20.95 kg/(m^2).  Estimated Nutritional Needs:  Kcal:   1150-1350  Protein:  60-70 grams  Fluid:  2-2.2 L/day  Skin:  Reviewed, no issues  Diet Order:  Diet Carb Modified Fluid consistency:: Thin; Room service appropriate?: Yes  EDUCATION NEEDS:  No education needs identified at this time   Intake/Output Summary (Last 24 hours) at 09/03/14 1147 Last data filed at 09/03/14 0700  Gross per 24 hour  Intake    250 ml  Output    500 ml  Net   -250 ml    Last BM:  PTA   Trenton GammonJessica Sueann Brownley, RD, LDN Inpatient Clinical Dietitian Pager # [phone] After hours/weekend pager # [phone]

## 2014-09-03 NOTE — Progress Notes (Addendum)
Patient Profile: 81 yo woman with PMH of systolic HF (EF 35-40% 01/2014), hypertension, dyslipidemia, GERD and recent hospitalization from 3/9-3/17 for PNA/sepsis requiring mechanical ventilation who had an episode of atrial fibrillation with RVR in the hospital, who was re-admitted 09/02/14 for right hip fracture after a mechanical fall. Also with  elevated troponins 1.03-->2.14 and increased O2 requirements.   Subjective: Resting. Denies any chest pain. Breathing ok on 4L [place]. Hip pain is controlled.   Objective: Vital signs in last 24 hours: Temp:  [98 F (36.7 C)-98.2 F (36.8 C)] 98.2 F (36.8 C) (06/02 0544) Pulse Rate:  [31-113] 106 (06/02 0544) Resp:  [14-35] 26 (06/02 0544) BP: (131-176)/(75-111) 176/96 mmHg (06/02 0544) SpO2:  [85 %-98 %] 94 % (06/02 0544) Weight:  [123 lb 10.9 oz (56.1 kg)-125 lb 14.1 oz (57.1 kg)] 125 lb 14.1 oz (57.1 kg) (06/02 0440) Last BM Date: 09/02/14  Intake/Output from previous day: 06/01 0701 - 06/02 0700 In: 250 [IV Piggyback:150] Out: 500 [Urine:500] Intake/Output this shift:    Medications Current Facility-Administered Medications  Medication Dose Route Frequency Provider Last Rate Last Dose  . cefTAZidime (FORTAZ) 2 g in dextrose 5 % 50 mL IVPB  2 g Intravenous Q12H Lorenza Evangelist, RPH   2 g at 09/03/14 0506  . cholecalciferol (VITAMIN D) tablet 5,000 Units  5,000 Units Oral Daily Eduard Clos, MD      . folic acid-pyridoxine-cyancobalamin (FOLTX) 2.5-25-2 MG per tablet 1 tablet  1 tablet Oral Daily Eduard Clos, MD      . hydrALAZINE (APRESOLINE) injection 5 mg  5 mg Intravenous Q4H PRN Eduard Clos, MD   5 mg at 09/03/14 0506  . HYDROcodone-acetaminophen (NORCO/VICODIN) 5-325 MG per tablet 1-2 tablet  1-2 tablet Oral Q6H PRN Eduard Clos, MD      . morphine 2 MG/ML injection 0.5 mg  0.5 mg Intravenous Q2H PRN Eduard Clos, MD   0.5 mg at 09/03/14 1610  . NIFEdipine (PROCARDIA XL/ADALAT-CC) 24 hr  tablet 90 mg  90 mg Oral Daily Eduard Clos, MD      . omega-3 acid ethyl esters (LOVAZA) capsule 1 g  1 capsule Oral Daily Eduard Clos, MD      . simvastatin (ZOCOR) tablet 20 mg  20 mg Oral Daily Eduard Clos, MD      . vancomycin (VANCOCIN) 500 mg in sodium chloride 0.9 % 100 mL IVPB  500 mg Intravenous Q12H Lorenza Evangelist, RPH   500 mg at 09/03/14 9604    PE: General appearance: alert, cooperative, no distress and frail  Neck: no carotid bruit and no JVD Lungs: faint bibasilar rales and rhonchi in right lung field  Heart: regular rate and rhythm Extremities: no LEE + SCDs Pulses: 2+ and symmetric Skin: warm and dry Neurologic: Grossly normal  Lab Results:   Recent Labs  09/02/14 1835 09/03/14 0156  WBC 5.6 5.5  HGB 11.0* 11.8*  HCT 34.9* 36.6  PLT 369 386   BMET  Recent Labs  09/02/14 1835 09/03/14 0156  NA 143 141  K 3.6 4.1  CL 112* 109  CO2 25 24  GLUCOSE 123* 154*  BUN 18 19  CREATININE 0.82 0.84  CALCIUM 8.9 8.9   PT/INR  Recent Labs  09/03/14 0156  LABPROT 15.5*  INR 1.22   Cholesterol No results for input(s): CHOL in the last 72 hours.  Cardiac Panel (last 3 results)  Recent Labs  09/02/14 2311  09/03/14 0156 09/03/14 0650  TROPONINI 1.03* 2.14* 5.87*    BNP    Component Value Date/Time   BNP 1502.4* 09/02/2014 1835    ProBNP    Component Value Date/Time   PROBNP 17819.0* 01/09/2014 0040    Studies/Results: CXR 07/22/14 FINDINGS: New interstitial and airspace opacities involving upper lung zones more than bases, right greater than left. Mild cardiomegaly stable. Tortuous atheromatous aorta. No pneumothorax. No effusion. Visualized skeletal structures are unremarkable.  IMPRESSION: 1. New asymmetric infiltrates or edema as above. 2. Stable mild cardiomegaly   Assessment/Plan  Principal Problem:   Femoral neck fracture Active Problems:   HTN (hypertension)   Chronic systolic heart failure    Acute respiratory failure with hypoxia   Hip fracture   Elevated troponin   1. NSTEMI: significant serial increases in serial troponins from 1.03-->2.14-->5.87. She denies any chest pain but presented with increased O2 requirements, now at 4L [place]. She appears to be comfortable at rest. She has known systolic HF with EF of 35-40% on previous echo 01/2014. Ischemic w/u was not pursued at time of initial diagnosis given advanced age and lack of symptoms. Her admission EKG showed abnormal T waves/ inversions in the anterolateral leads, more pronounced than prior EKGs. Given rise in troponins, will recheck 12 lead and will order a 2D echo. Given significant troponin elevation and systolic dysfunction, she is high risk for surgery. She may not be a candidate for invasive ischemic eval, but we will try to optimize her acute  CHF and continue to diuresis.     2. CHF: last known EF 35-40%. With increased O2 requirements. Sats stable on 4L. No labored breathing on my exam. Ordered to get 2 doses of lasix. First dose given at 0500. Mild bibasilar rales no LEE. Monitor response.   3. Right Hip Fracture: Surgery recommended by ortho. Will try to optimize her HF prior to OR. She is High Risk.     LOS: 1 day    Brittainy M. Delmer Islam 09/03/2014 7:43 AM  Patient seen and examined and history reviewed. Agree with above findings and plan. 81 yo BF admitted after mechanical fall and right hip fracture. She has a history of chronic systolic CHF with EF by Echo in October 2015 of 30-35%. She had segmental wall motion abnormalites suggesting ischemic heart disease but due to age and frail status she has no prior ischemic work up. According to her grand-daughter her functional status is limited- able to get around home with a walker. History of prior falls. Now with evidence of decompensated CHF with edema noted on CXR and elevated BNP. Also evidence of NSTEMI with elevation of troponins and new inferolateral ST-T  wave changes. She denies any chest pain or SOB currently. Exam reveals an elderly thin BM in NAD. No JVD. Bibasilar rales. CV exam reveals a LV heave and 2/6 harsh systolic murmur at the apex. Given acute on chronic systolic CHF and NSTEMI she will be high risk for general anesthesia and surgery. We may be able to mitigate this risk some by tuning up her CHF. Will diurese with IV lasix. Update Echo. She is not a candidate for invasive ischemic evaluation with cardiac cath. Decision concerning surgery will need to weigh risk versus quality of life (pain control and mobility). I discussed with grand-daughter Joni Reining. Will need further discussion with family concerning wishes and goals of care. At best I would postpone surgery until Friday to allow [place] to improve her volume status. Will start low  dose ACEi and Coreg. Lasix IV 40 mg bid. Follow I/O and daily BMET.  Peter SwazilandJordan, MDFACC 09/03/2014 11:23 AM

## 2014-09-03 NOTE — Progress Notes (Signed)
ANTIBIOTIC CONSULT NOTE - INITIAL  Pharmacy Consult for Fortaz/Vancomycin Indication: HCAP  No Known Allergies  Patient Measurements: Height: 5\' 5"  (165.1 cm) Weight: 123 lb 10.9 oz (56.1 kg) IBW/kg (Calculated) : 57   Vital Signs: Temp: 98.1 F (36.7 C) (06/02 0231) Temp Source: Oral (06/02 0231) BP: 158/92 mmHg (06/02 0231) Pulse Rate: 101 (06/02 0231) Intake/Output from previous day: 06/01 0701 - 06/02 0700 In: -  Out: 100 [Urine:100] Intake/Output from this shift: Total I/O In: -  Out: 100 [Urine:100]  Labs:  Recent Labs  09/02/14 1835 09/03/14 0156  WBC 5.6 5.5  HGB 11.0* 11.8*  PLT 369 386  CREATININE 0.82  --    Estimated Creatinine Clearance: 42 mL/min (by C-G formula based on Cr of 0.82). No results for input(s): VANCOTROUGH, VANCOPEAK, VANCORANDOM, GENTTROUGH, GENTPEAK, GENTRANDOM, TOBRATROUGH, TOBRAPEAK, TOBRARND, AMIKACINPEAK, AMIKACINTROU, AMIKACIN in the last 72 hours.   Microbiology: No results found for this or any previous visit (from the past 720 hour(s)).  Medical History: Past Medical History  Diagnosis Date  . Hypertension   . CHF (congestive heart failure)   . Hyperlipidemia   . Heart murmur   . Shortness of breath   . GERD (gastroesophageal reflux disease)     Medications:  Prescriptions prior to admission  Medication Sig Dispense Refill Last Dose  . acetaminophen (TYLENOL) 325 MG tablet Take 2 tablets (650 mg total) by mouth every 6 (six) hours as needed for mild pain, moderate pain, fever or headache. (Patient not taking: Reported on 09/02/2014)   Not Taking at Unknown time  . amiodarone (PACERONE) 200 MG tablet Take 1 tablet (200 mg total) by mouth daily. (Patient not taking: Reported on 09/02/2014)   Not Taking at Unknown time  . amoxicillin-clavulanate (AUGMENTIN) 500-125 MG per tablet Take 1 tablet (500 mg total) by mouth 3 (three) times daily. (Patient not taking: Reported on 09/02/2014)   Not Taking at Unknown time  . aspirin 325  MG tablet Take 1 tablet (325 mg total) by mouth daily. (Patient not taking: Reported on 09/02/2014)   Not Taking at Unknown time  . bisoprolol (ZEBETA) 5 MG tablet Take 0.5 tablets (2.5 mg total) by mouth daily. (Patient not taking: Reported on 09/02/2014)   Not Taking at Unknown time  . Calcium Carbonate-Vitamin D (CALCIUM 600+D) 600-200 MG-UNIT TABS Take 1 tablet by mouth 2 (two) times daily.   Not Taking at Unknown time  . Cholecalciferol (VITAMIN D-3) 5000 UNITS TABS Take 1 tablet by mouth daily.   Not Taking at Unknown time  . feeding supplement, RESOURCE BREEZE, (RESOURCE BREEZE) LIQD Take 1 Container by mouth 3 (three) times daily between meals. (Patient not taking: Reported on 09/02/2014)  0 Not Taking at Unknown time  . Folic Acid-Vit B6-Vit B12 (FOLGARD) 0.8-10-0.115 MG TABS Take 1 tablet by mouth daily.   Not Taking at Unknown time  . furosemide (LASIX) 20 MG tablet Take 1 tablet (20 mg total) by mouth daily. (Patient not taking: Reported on 09/02/2014) 30 tablet  Not Taking at Unknown time  . ipratropium-albuterol (DUONEB) 0.5-2.5 (3) MG/3ML SOLN Take 3 mLs by nebulization every 4 (four) hours as needed. (Patient not taking: Reported on 09/02/2014) 360 mL  Not Taking at Unknown time  . NIFEdipine (ADALAT CC) 90 MG 24 hr tablet Take 1 tablet by mouth daily.   Not Taking at Unknown time  . Omega 3-6-9 Fatty Acids (OMEGA-3 FUSION) LIQD Take 5 mLs by mouth daily. 1000mg  omega 3 liquid   Not  Taking at Unknown time  . Probiotic Product (PROBIOTIC DAILY PO) Take 1 capsule by mouth daily.   Not Taking at Unknown time  . simvastatin (ZOCOR) 20 MG tablet Take 20 mg by mouth daily.   Not Taking at Unknown time   Scheduled:  . cefTAZidime (FORTAZ)  IV  2 g Intravenous Q12H  . cholecalciferol  5,000 Units Oral Daily  . folic acid-pyridoxine-cyancobalamin  1 tablet Oral Daily  . NIFEdipine  90 mg Oral Daily  . omega-3 acid ethyl esters  1 capsule Oral Daily  . simvastatin  20 mg Oral Daily  . vancomycin  500  mg Intravenous Q12H   Infusions:   Assessment: 89 yoF s/p fall at home will go to surgery 6/2.  Fortaz and Vancomycin per Rx for HCAP.   Goal of Therapy:  Vancomycin trough level 15-20 mcg/ml  Plan:   Elita Quick 2Gm IV q12h  Vancomycin  IV q12h  F/u Scr/cultures/levels as needed  Susanne Greenhouse R 09/03/2014,2:52 AM

## 2014-09-03 NOTE — Progress Notes (Signed)
CRITICAL VALUE ALERT  Critical value received:  Trop 1.03  Date of notification:  09/03/14  Time of notification:  00:48  Critical value read back:Yes.    Nurse who received alert:  Lorretta Harp Keianna Signer, RN  MD notified (1st page):  Toniann FailKakrakandy  Time of first page:  0050  MD notified (2nd page):  Time of second page:  Responding MD:  Toniann FailKakrakandy  Time MD responded:  [phone]

## 2014-09-03 NOTE — Progress Notes (Signed)
Pt. Has valuables in hospital safety. She has a silver necklace and $371 dollars in cash. The key is in her chart.  Rockne Coonsorcoran, Yifan Auker C, RN

## 2014-09-03 NOTE — Progress Notes (Signed)
Pt blood pressure 162/18502mmHg. Made MD aware. Hydralazine PRN Q4 has been ordered per MD for systolic blood pressure over 160. Rockne Coonsorcoran, Jazlyn Tippens C, RN

## 2014-09-03 NOTE — H&P (Addendum)
[facility] History and Physical  Traci CopperOphelia G Hale RUE:[id number] DOB: 12/16/1924 DOA: 09/02/2014  Referring physician: Mr.Mintz. PCP: Cain SaupeFULP, CAMMIE, MD  Specialists: None.  Chief Complaint: Fall.  HPI: Traci CopperOphelia G Hale is a 81 y.o. female with history of chronic systolic heart failure last year measured was 35-40%, hypertension, chronic anemia, dyslipidemia was brought to the ER after patient had a fall at the backyard of her house. Patient states she still been fairly not hit her head or lose consciousness. In the ER x-rays revealed right hip fracture and Dr. Magnus IvanBlackman on call orthopedic surgeon was consulted. Patient's chest x-ray shows infiltrates versus edema and patient is requiring at least 3 L of function at this time. Patient does say that she has some shortness of breath over the last few days. Denies any chest pain. Denies any fever or chills. Patient's troponin is mildly elevated and Dr. Tresa EndoKelly on-call cardiologist was consulted. Patient has been admitted for further management. Patient denies any headache visual symptoms nausea vomiting abdominal pain and diarrhea.   Review of Systems: As presented in the history of presenting illness, rest negative.  Past Medical History  Diagnosis Date  . Hypertension   . CHF (congestive heart failure)   . Hyperlipidemia   . Heart murmur   . Shortness of breath   . GERD (gastroesophageal reflux disease)    Past Surgical History  Procedure Laterality Date  . Leg surgery     Social History:  reports that she has quit smoking. She has never used smokeless tobacco. She reports that she does not drink alcohol or use illicit drugs. Where does patient live home. Can patient participate in ADLs? Yes.  No Known Allergies  Family History:  Family History  Problem Relation Age of Onset  . Hypertension Father   . Breast cancer Daughter       Prior to Admission medications   Medication Sig Start Date End Date Taking? Authorizing  Provider  acetaminophen (TYLENOL) 325 MG tablet Take 2 tablets (650 mg total) by mouth every 6 (six) hours as needed for mild pain, moderate pain, fever or headache. Patient not taking: Reported on 09/02/2014 06/18/14   Joseph ArtJessica U Vann, DO  amiodarone (PACERONE) 200 MG tablet Take 1 tablet (200 mg total) by mouth daily. Patient not taking: Reported on 09/02/2014 06/18/14   Joseph ArtJessica U Vann, DO  amoxicillin-clavulanate (AUGMENTIN) 500-125 MG per tablet Take 1 tablet (500 mg total) by mouth 3 (three) times daily. Patient not taking: Reported on 09/02/2014 06/18/14   Joseph ArtJessica U Vann, DO  aspirin 325 MG tablet Take 1 tablet (325 mg total) by mouth daily. Patient not taking: Reported on 09/02/2014 06/18/14   Joseph ArtJessica U Vann, DO  bisoprolol (ZEBETA) 5 MG tablet Take 0.5 tablets (2.5 mg total) by mouth daily. Patient not taking: Reported on 09/02/2014 06/18/14   Joseph ArtJessica U Vann, DO  Calcium Carbonate-Vitamin D (CALCIUM 600+D) 600-200 MG-UNIT TABS Take 1 tablet by mouth 2 (two) times daily.    Historical Provider, MD  Cholecalciferol (VITAMIN D-3) 5000 UNITS TABS Take 1 tablet by mouth daily.    Historical Provider, MD  feeding supplement, RESOURCE BREEZE, (RESOURCE BREEZE) LIQD Take 1 Container by mouth 3 (three) times daily between meals. Patient not taking: Reported on 09/02/2014 06/18/14   Joseph ArtJessica U Vann, DO  Folic Acid-Vit B6-Vit B12 (FOLGARD) 0.8-10-0.115 MG TABS Take 1 tablet by mouth daily.    Historical Provider, MD  furosemide (LASIX) 20 MG tablet Take 1 tablet (20 mg total) by mouth  daily. Patient not taking: Reported on 09/02/2014 06/18/14   Joseph Art, DO  ipratropium-albuterol (DUONEB) 0.5-2.5 (3) MG/3ML SOLN Take 3 mLs by nebulization every 4 (four) hours as needed. Patient not taking: Reported on 09/02/2014 06/18/14   Joseph Art, DO  NIFEdipine (ADALAT CC) 90 MG 24 hr tablet Take 1 tablet by mouth daily. 06/05/14   Historical Provider, MD  Omega 3-6-9 Fatty Acids (OMEGA-3 FUSION) LIQD Take 5 mLs by mouth daily.   omega 3 liquid    Historical Provider, MD  Probiotic Product (PROBIOTIC DAILY PO) Take 1 capsule by mouth daily.    Historical Provider, MD  simvastatin (ZOCOR) 20 MG tablet Take 20 mg by mouth daily.    Historical Provider, MD    Physical Exam: Filed Vitals:   09/02/14 2300 09/02/14 2351 09/03/14 0007 09/03/14 0024  BP: 157/110 138/104    Pulse: 51 42    Temp:  98.2 F (36.8 C)    TempSrc:  Oral    Resp: 27 26    Height:   (1.651 m)    Weight:  56.1 kg (123 lb 10.9 oz)    SpO2: 88% 85% 91% 92%     General:  Moderately built and nourished.  Eyes: Anicteric no pallor.  ENT: No discharge from the ears eyes nose and mouth.  Neck: No mass felt. JVD not appreciated.  Cardiovascular: S1-S2 heard.  Respiratory: No rhonchi or crepitations.  Abdomen: Soft nontender bowel sounds present.  Skin: No rash.  Musculoskeletal: Pain on moving right hip.  Psychiatric: Appears normal.  Neurologic: Alert awake oriented to time place and person. Moves all extremities.  Labs on Admission:  Basic Metabolic Panel:  Recent Labs Lab 09/02/14 1835  NA 143  K 3.6  CL 112*  CO2 25  GLUCOSE 123*  BUN 18  CREATININE 0.82  CALCIUM 8.9   Liver Function Tests: No results for input(s): AST, ALT, ALKPHOS, BILITOT, PROT, ALBUMIN in the last 168 hours. No results for input(s): LIPASE, AMYLASE in the last 168 hours. No results for input(s): AMMONIA in the last 168 hours. CBC:  Recent Labs Lab 09/02/14 1835  WBC 5.6  NEUTROABS 4.9  HGB 11.0*  HCT 34.9*  MCV 105.1*  PLT 369   Cardiac Enzymes:  Recent Labs Lab 09/02/14 2311  TROPONINI 1.03*    BNP (last 3 results)  Recent Labs  09/02/14 1835  BNP 1502.4*    ProBNP (last 3 results)  Recent Labs  01/08/14 1401 01/09/14 0040  PROBNP 18570.0* 17819.0*    CBG: No results for input(s): GLUCAP in the last 168 hours.  Radiological Exams on Admission: Dg Chest 2 View  09/02/2014   CLINICAL DATA:  Fall  today.  Hip fx. Htn. Chf  EXAM: CHEST - 2 VIEW  COMPARISON:  07/22/2014  FINDINGS: New interstitial and airspace opacities involving upper lung zones more than bases, right greater than left. Mild cardiomegaly stable. Tortuous atheromatous aorta. No pneumothorax. No effusion. Visualized skeletal structures are unremarkable.  IMPRESSION: 1. New asymmetric infiltrates or edema as above. 2. Stable mild cardiomegaly   Electronically Signed   By: Corlis Leak M.D.   On: 09/02/2014 19:50   Dg Hip Unilat  With Pelvis 2-3 Views Right  09/02/2014   CLINICAL DATA:  Pain post fall at home today  EXAM: RIGHT HIP (WITH PELVIS) 2-3 VIEWS  COMPARISON:  06/10/2014  FINDINGS: Midcervical fracture of the right femur, impacted with some displacement and foreshortening. No dislocation. IM rod and  sliding pin fixation hardware across the left femoral neck as before. Bony pelvis intact. Degenerative disc disease in the visualized lower lumbar spine.  IMPRESSION: 1. Displaced midcervical right femur fracture fracture.   Electronically Signed   By: Corlis Leak M.D.   On: 09/02/2014 17:30   Dg Femur, Min 2 Views Right  09/02/2014   CLINICAL DATA:  Proximal RIGHT femur fracture.  EXAM: RIGHT FEMUR 2 VIEWS  COMPARISON:  None.  FINDINGS: Distal femur is intact. Tricompartmental osteoarthritis of the knee is incidentally noted.  IMPRESSION: Intact distal femur.   Electronically Signed   By: Andreas Newport M.D.   On: 09/02/2014 20:17    EKG: Independently reviewed. Sinus tachycardia.  Assessment/Plan Principal Problem:   Femoral neck fracture Active Problems:   HTN (hypertension)   Chronic systolic heart failure   Acute respiratory failure with hypoxia   Hip fracture   Elevated troponin   1. Right hip fracture status post mechanical fall - appreciate orthopedic surgery consult and cardiology consult. At this time per cardiology patient is moderate risk for intermediate risk procedure. Cardiology at this time once patient's  wallet status to be optimized before surgery. See #2. 2. Acute respiratory failure secondary to CHF and possible pneumonia - at this time patient is requiring at least 3 L of oxygen. Chest x-ray shows infiltrates versus edema. Patient is making some wrapping sound and breathing. Patient also has gained weight from her baseline of 108 pounds and presently is weighing around 123 pounds. I have discussed with Dr. Tresa Endo on-call cardiologist at this time who has recommended to give Lasix for which I have ordered 20 mg IV and we will see response. May need another dose. For now I have placed patient on empiric antibiotics for possible pneumonia. Check pro-calcitonin levels and if negative may discontinue antibiotics. Closely follow intake and output and daily weights. Patient's last EF measured was 35-40%. 3. Elevated troponin - per cardiology this may be secondary to demand ischemia. They have recommended to cycle cardiac markers. 4. Uncontrolled hypertension - continue home medications and I have placed patient on when necessary IV hydralazine. 5. Chronic anemia - follow CBC.  Patient's CODE STATUS is DO NOT RESUSCITATE but patient was intubated in March this year for acute respiratory failure secondary to pneumonia and sepsis.   DVT Prophylaxis SCDs in anticipation of possible surgery.  Code Status: DO NOT RESUSCITATE.  Family Communication: Discussed with patient.  Disposition Plan: Admit to inpatient.    Parish Augustine N. [facility] Pager [phone].  If 7PM-7AM, please contact night-coverage [email or web address] Password [email or web address] 09/03/2014, 1:16 AM

## 2014-09-03 NOTE — Progress Notes (Signed)
Echocardiogram 2D Echocardiogram has been performed.  Nolon RodBrown, Tony 09/03/2014, 10:16 AM

## 2014-09-03 NOTE — Progress Notes (Signed)
Pt troponin level 2.14. Notified Toniann FailKakrakandy and the cardiologist J.Kelly aware of pt. Pt has no complaints of chest pain. Will continue to monitor patient and let day shift nurse aware. Rockne Coonsorcoran, Penina Reisner C, RN

## 2014-09-03 NOTE — Progress Notes (Signed)
CRITICAL VALUE ALERT  Critical value received:  Troponin 2.14  Date of notification:  09/03/2014  Time of notification:  0350  Critical value read back:Yes.    Nurse who received alert:  Isaac Laud Corcoran, RN   MD notified (1st page):  Toniann FailKakrakandy and J.Kelly  Time of first page:  [phone] and [phone]  MD notified (2nd page):  Time of second page:  Responding MD:  Reginia NaasKakrakandy and J.Kelly  Time MD responded:  [phone] and [phone]

## 2014-09-03 NOTE — Care Management Note (Signed)
Case Management Note  Patient Details  Name: Creed CopperOphelia G Kahler MRN: [id number] Date of Birth: Jun 16, 1924  Subjective/Objective: 81 y/o f admitted w/fall.hip fx.CHF, elevated troponin's.From home.                   Action/Plan:Ortho following for sx in am if medically stable.   Expected Discharge Date:   (unknown)               Expected Discharge Plan:  Skilled Nursing Facility  In-House Referral:  Clinical Social Work  Discharge planning Services  CM Consult  Post Acute Care Choice:    Choice offered to:     DME Arranged:    DME Agency:     HH Arranged:    HH Agency:     Status of Service:  In process, will continue to follow  Medicare Important Message Given:    Date Medicare IM Given:    Medicare IM give by:    Date Additional Medicare IM Given:    Additional Medicare Important Message give by:     If discussed at Long Length of Stay Meetings, dates discussed:    Additional Comments:  Lanier ClamMahabir, Cale Decarolis, RN 09/03/2014, 2:34 PM

## 2014-09-03 NOTE — Progress Notes (Signed)
Patient ID: Traci Hale, female   DOB: 12-24-1924, 81 y.o.   MRN: [id number] Given her increased troponin (no even higher) as well as her increased oxygen requirements, will hold on surgery for today.  I have spoken to her about the significant high risk involved with surgery today.  I'll see her again this evening and talk with her and her family further about this.  Of course, quality of life and pain control are quite important, but today, the surgery risks are too great.

## 2014-09-03 NOTE — Progress Notes (Signed)
Patient ID: Traci Hale, female   DOB: September 14, 1924, 81 y.o.   MRN: [id number] Ms. Traci Hale has a displaced right hip femoral neck fracture that will require a partial hip replacement for pain and mobility purposes.  Awaiting Cardiac clearance prior to scheduling for surgery.  An increase in Troponin and need for oxygen requirement noted.  Will post for surgery once cleared.

## 2014-09-03 NOTE — Progress Notes (Signed)
CRITICAL VALUE ALERT  Critical value received:  Troponin 6.09  Date of notification:  09/03/14  Time of notification:  1340  Critical value read back:Yes.    Nurse who received alert:  Catie Tildon HuskyBeeson  MD notified (1st page):  Rai  Time of first page:  1341  MD notified (2nd page):  Time of second page:  Responding MD:  Rai   Time MD responded:  1344

## 2014-09-03 NOTE — Progress Notes (Signed)
[facility]                                                                              Patient Demographics  Traci Hale, is a 81 y.o. female, DOB - 03-29-1925, ZOX:[id number]  Admit date - 09/02/2014   Admitting Physician Eduard Clos, MD  Outpatient Primary MD for the patient is FULP, CAMMIE, MD  LOS - 1   Chief Complaint  Patient presents with  . Fall  . Hip Pain       Brief HPI   Traci Hale is a 81 y.o. female with history of chronic systolic heart failure last year measured was 35-40%, hypertension, chronic anemia, dyslipidemia was brought to the ER after patient had a fall at the backyard of her house. Patient states she still been fairly not hit her head or lose consciousness. In the ER x-rays revealed right hip fracture and Dr. Magnus Ivan on call orthopedic surgeon was consulted. Patient's chest x-ray showed infiltrates versus edema and patient was requiring at least 3 L of O2 in ED.  Patient reported some shortness of breath over the last few days. She denied any chest pain, fevers or chills. Patient's troponinwas mildly elevated and Dr. Tresa Endo on-call cardiologist was consulted. Patient was admitted for further management. Patient denied any headache visual symptoms nausea vomiting abdominal pain and diarrhea   Assessment & Plan    Principal Problem:   Femoral neck fracture/right hip fracture after mechanical fall - Appreciate cardiology and orthopedics recommendations., Currently high risk and medical status needs to be optimized prior to surgery hopefully tomorrow  - Continue pain control  Active Problems: NSTEMI: - Cardiology consulted, patient has significant increase in the troponins, last one 5.87, currently denying any chest pain - Cardiology following, recommending 2-D echocardiogram, recheck EKG and due to elevated troponin and systolic dysfunction, currently too high risk for surgery. Also per cardiology, may not be a candidate  for invasive ischemic evaluation, continue to diurese. - 2-D echo today. EF of 35-40% with diffuse hypokinesis, grade 2 diastolic dysfunction - Currently on Lasix, nifedipine, Coreg, statin. Heparin drip, follow cardiology recommendations?  Acute hypoxic respiratory failure with acute on chronic combined systolic and diastolic CHF: Currently O2 sats 98% on 4 L - 2-D echo done today, EF of 35-40% with diffuse hypokinesis, grade 2 diastolic dysfunction, no significant change in the EF from the prior echo -  currently hypoxic, was given Lasix, cardiology following - Continue strict I's and O's and daily weights, 2-D echocardiogram  - Chest x-ray with possible infiltrates versus edema, patient was placed on IV antibiotics  Uncontrolled hypertension - Continue Lasix, lisinopril, Coreg, nifedipine, hydralazine  Chronic anemia - Closely monitor, keep hemoglobin > 10 due to NSTEMI  Code Status: dnr   Family Communication: Discussed in detail with the patient, all imaging results, lab results explained to the patient    Disposition Plan:   Time Spent in minutes  25 minutes  Procedures  2-D echocardiogram Chest x-ray  Consults   Cardiology  Orthopedics   DVT Prophylaxis  SCD's  Medications  Scheduled Meds: . carvedilol  3.125 mg Oral BID WC  .  cefTAZidime (FORTAZ)  IV  2 g Intravenous Q12H  . cholecalciferol  5,000 Units Oral Daily  . folic acid-pyridoxine-cyancobalamin  1 tablet Oral Daily  . furosemide  40 mg Intravenous Q12H  . lisinopril  2.5 mg Oral Daily  . [START ON 09/04/2014] NIFEdipine  60 mg Oral Daily  . omega-3 acid ethyl esters  1 capsule Oral Daily  . simvastatin  20 mg Oral Daily  . vancomycin  500 mg Intravenous Q12H   Continuous Infusions:  PRN Meds:.hydrALAZINE, HYDROcodone-acetaminophen, morphine injection   Antibiotics   Anti-infectives    Start     Dose/Rate Route Frequency Ordered Stop   09/03/14 0600  cefTAZidime (FORTAZ) 2 g in dextrose 5 % 50 mL  IVPB     2 g 100 mL/hr over 30 Minutes Intravenous Every 12 hours 09/03/14 0134     09/03/14 0200  vancomycin (VANCOCIN) 500 mg in sodium chloride 0.9 % 100 mL IVPB     500 mg 100 mL/hr over 60 Minutes Intravenous Every 12 hours 09/03/14 0126     09/02/14 2215  cefTRIAXone (ROCEPHIN) 1 g in dextrose 5 % 50 mL IVPB     1 g 100 mL/hr over 30 Minutes Intravenous  Once 09/02/14 2201 09/03/14 1224   09/02/14 2215  azithromycin (ZITHROMAX) 500 mg in dextrose 5 % 250 mL IVPB     500 mg 250 mL/hr over 60 Minutes Intravenous  Once 09/02/14 2201 09/03/14 1224        Subjective:   Traci Hale was seen and examined today.  Patient denies dizziness, chest pain, shortness of breath, abdominal pain, N/V/D/C, new weakness, numbess, tingling. No acute events overnight.    Objective:   Blood pressure 155/90, pulse 96, temperature 98.1 F (36.7 C), temperature source Oral, resp. rate 28, height  (1.651 m), weight 57.1 kg (125 lb 14.1 oz), SpO2 98 %.  Wt Readings from Last 3 Encounters:  09/03/14 57.1 kg (125 lb 14.1 oz)  06/12/14 58.6 kg (129 lb 3 oz)  01/10/14 49.42 kg (108 lb 15.2 oz)     Intake/Output Summary (Last 24 hours) at 09/03/14 1339 Last data filed at 09/03/14 0700  Gross per 24 hour  Intake    250 ml  Output    500 ml  Net   -250 ml    Exam  General: Alert and oriented x 2, NAD  HEENT:  PERRLA, EOMI, Anicteic Sclera, mucous membranes moist.   Neck: Supple, no JVD, no masses  CVS: S1 S2 auscultated, no rubs, murmurs or gallops. Regular rate and rhythm.  Respiratory: Bibasilar Rales   Abdomen: Soft, nontender, nondistended, + bowel sounds  Ext: no cyanosis clubbing or edema  Neuro: AAOx2, Cr N's II- XII. Strength 5/5 upper and lower extremities bilaterally  Skin: No rashes  Psych: Normal affect and demeanor, alert and oriented x2   Data Review   Micro Results No results found for this or any previous visit (from the past 240 hour(s)).  Radiology  Reports Dg Chest 2 View  09/02/2014   CLINICAL DATA:  Fall today.  Hip fx. Htn. Chf  EXAM: CHEST - 2 VIEW  COMPARISON:  07/22/2014  FINDINGS: New interstitial and airspace opacities involving upper lung zones more than bases, right greater than left. Mild cardiomegaly stable. Tortuous atheromatous aorta. No pneumothorax. No effusion. Visualized skeletal structures are unremarkable.  IMPRESSION: 1. New asymmetric infiltrates or edema as above. 2. Stable mild cardiomegaly   Electronically Signed   By: Corlis Leak  M.D.   On: 09/02/2014 19:50   Dg Hip Unilat  With Pelvis 2-3 Views Right  09/02/2014   CLINICAL DATA:  Pain post fall at home today  EXAM: RIGHT HIP (WITH PELVIS) 2-3 VIEWS  COMPARISON:  06/10/2014  FINDINGS: Midcervical fracture of the right femur, impacted with some displacement and foreshortening. No dislocation. IM rod and sliding pin fixation hardware across the left femoral neck as before. Bony pelvis intact. Degenerative disc disease in the visualized lower lumbar spine.  IMPRESSION: 1. Displaced midcervical right femur fracture fracture.   Electronically Signed   By: Corlis Leak  Hassell M.D.   On: 09/02/2014 17:30   Dg Femur, Min 2 Views Right  09/02/2014   CLINICAL DATA:  Proximal RIGHT femur fracture.  EXAM: RIGHT FEMUR 2 VIEWS  COMPARISON:  None.  FINDINGS: Distal femur is intact. Tricompartmental osteoarthritis of the knee is incidentally noted.  IMPRESSION: Intact distal femur.   Electronically Signed   By: Andreas NewportGeoffrey  Lamke M.D.   On: 09/02/2014 20:17    CBC  Recent Labs Lab 09/02/14 1835 09/03/14 0156  WBC 5.6 5.5  HGB 11.0* 11.8*  HCT 34.9* 36.6  PLT 369 386  MCV 105.1* 105.2*  MCH 33.1 33.9  MCHC 31.5 32.2  RDW 14.6 14.7  LYMPHSABS 0.5* 0.8  MONOABS 0.2 0.4  EOSABS 0.0 0.0  BASOSABS 0.0 0.0    Chemistries   Recent Labs Lab 09/02/14 1835 09/03/14 0156  NA 143 141  K 3.6 4.1  CL 112* 109  CO2 25 24  GLUCOSE 123* 154*  BUN 18 19  CREATININE 0.82 0.84  CALCIUM 8.9 8.9   AST  --  33  ALT  --  12*  ALKPHOS  --  62  BILITOT  --  0.4   ------------------------------------------------------------------------------------------------------------------ estimated creatinine clearance is 41.7 mL/min (by C-G formula based on Cr of 0.84). ------------------------------------------------------------------------------------------------------------------ No results for input(s): HGBA1C in the last 72 hours. ------------------------------------------------------------------------------------------------------------------ No results for input(s): CHOL, HDL, LDLCALC, TRIG, CHOLHDL, LDLDIRECT in the last 72 hours. ------------------------------------------------------------------------------------------------------------------ No results for input(s): TSH, T4TOTAL, T3FREE, THYROIDAB in the last 72 hours.  Invalid input(s): FREET3 ------------------------------------------------------------------------------------------------------------------ No results for input(s): VITAMINB12, FOLATE, FERRITIN, TIBC, IRON, RETICCTPCT in the last 72 hours.  Coagulation profile  Recent Labs Lab 09/03/14 0156  INR 1.22    No results for input(s): DDIMER in the last 72 hours.  Cardiac Enzymes  Recent Labs Lab 09/02/14 2311 09/03/14 0156 09/03/14 0650  TROPONINI 1.03* 2.14* 5.87*   ------------------------------------------------------------------------------------------------------------------ Invalid input(s): POCBNP  No results for input(s): GLUCAP in the last 72 hours.   Narayan Scull M.D. [facility] 09/03/2014, 1:39 PM  Pager: [phone]   Between 7am to 7pm - call Pager - [phone]  After 7pm go to [email or web address] - password [email or web address]  Call night coverage person covering after 7pm

## 2014-09-04 DIAGNOSIS — I5043 Acute on chronic combined systolic (congestive) and diastolic (congestive) heart failure: Secondary | ICD-10-CM

## 2014-09-04 DIAGNOSIS — I214 Non-ST elevation (NSTEMI) myocardial infarction: Secondary | ICD-10-CM | POA: Diagnosis present

## 2014-09-04 LAB — GLUCOSE, CAPILLARY
GLUCOSE-CAPILLARY: 99 mg/dL (ref 65–99)
GLUCOSE-CAPILLARY: 101 mg/dL — AB (ref 65–99)
Glucose-Capillary: 107 mg/dL — ABNORMAL HIGH (ref 65–99)

## 2014-09-04 LAB — BASIC METABOLIC PANEL
ANION GAP: 8 (ref 5–15)
BUN: 26 mg/dL — ABNORMAL HIGH (ref 6–20)
CALCIUM: 8.5 mg/dL — AB (ref 8.9–10.3)
CO2: 28 mmol/L (ref 22–32)
CREATININE: 0.87 mg/dL (ref 0.44–1.00)
Chloride: 103 mmol/L (ref 101–111)
GFR calc Af Amer: >60 mL/min (ref >60)
GFR, EST NON AFRICAN AMERICAN: 58 mL/min — AB (ref >60)
Glucose, Bld: 106 mg/dL — ABNORMAL HIGH (ref 65–99)
Potassium: 3.2 mmol/L — ABNORMAL LOW (ref 3.5–5.1)
Sodium: 139 mmol/L (ref 135–145)

## 2014-09-04 MED ORDER — POTASSIUM CHLORIDE CRYS ER 20 MEQ PO TBCR
40.0000 meq | EXTENDED_RELEASE_TABLET | Freq: Once | ORAL | Status: AC
  Administered 2014-09-04: 40 meq via ORAL
  Filled 2014-09-04: qty 2

## 2014-09-04 MED ORDER — FUROSEMIDE 40 MG PO TABS
40.0000 mg | ORAL_TABLET | Freq: Two times a day (BID) | ORAL | Status: DC
  Administered 2014-09-04 – 2014-09-09 (×10): 40 mg via ORAL
  Filled 2014-09-04 (×10): qty 1

## 2014-09-04 NOTE — Clinical Documentation Improvement (Signed)
Supporting Information:  6/2 Initial Nutrition Assessment  Severe malnutrition in context of chronic illness  NUTRITION DIAGNOSIS:  Inadequate oral intake related to inability to eat as evidenced by NPO status. During visit with pt, it was noted that she does not have any teeth; unsure at this time if she has dentures.  Pt unable to meet needs at this time.  09/02/14 5\' 5"  (1.651 m)       09/03/14 125 lb 14.1 oz (57.1 kg)      Ideal Body Weight: 56.8 kg (kg) BMI: Body mass index is 20.95 kg/(m^2).  After study, please clarify nutritional status in progress notes and discharge summary  . Severity: --Mild (first degree) --Moderate (second degree) --Severe (third degree) --Other --Unable to determine  . Avoid documenting a range of severity, such as "moderate to severe"  . Form: --Marasmus --Marasmic kwashiorkor --Other --Unable to determine  . Document any associated diagnoses/conditions  Thank You, Caydence Enck T. Luiz OchoaWilliams RN, MSN, MBA/MHA Clinical Documentation Specialist [email or web address] Office # [phone]

## 2014-09-04 NOTE — Consult Note (Signed)
WOC wound consult note Reason for Consult: Stage II sacral pressure ulcer, present on admission Left medial malleolus ulcers, chronic. unstageable Wound type:Pressure ulcer Pressure Ulcer POA: Yes Measurement: Left malleolus 1 cm x 0.5 cm 100% scabbed Sacrum 1.5 cm x 1 cm x 0.1 cm Wound WUJ:[id number]:sacrum 100% pink and moist Drainage (amount, consistency, odor) None noted Periwound:Intact Dressing procedure/placement/frequency:Cleanse sacrum with soap and water. Apply Allevyn sacral dressing.  Change every 3 days and PRN soilage.  Prevalon boots to heels Will not follow at this time.  Please re-consult if needed.  Maple HudsonKaren Avishai Reihl RN BSN CWON Pager [phone]

## 2014-09-04 NOTE — Progress Notes (Signed)
Patient ID: Traci Hale, female   DOB: [birth year], 81 y.o.   MRN: [id number] I spoke with several family members as well as Ms. Jean RosenthalJackson and they understand the situation.  I will see her tomorrow 09/05/14 after they talk further and medical management has been optimized.  If surgery is chosen as a course of treatment for her right hip fracture, then I will schedule her for 6/5.

## 2014-09-04 NOTE — Clinical Social Work Note (Signed)
CSW met with pt and her daughter at bedside to provide bed offers  CSW provided pt and her daughter with a list of facilities that stated they can accommodate pt at discharge  Pt and her daughter discussed pt history at [facility] and stated they wanted pt to go to this SNF at discharge from hosspital   CSW will continue to follow pt until discharge  .Dede Query, LCSW [facility] Clinical Social Worker - Weekend Coverage cell #: [phone]

## 2014-09-04 NOTE — Progress Notes (Signed)
[facility]                                                                              Patient Demographics  Traci Hale, is a 81 y.o. female, DOB - 1925-01-28, ZOX:[id number]  Admit date - 09/02/2014   Admitting Physician Eduard Clos, MD  Outpatient Primary MD for the patient is FULP, CAMMIE, MD  LOS - 2   Chief Complaint  Patient presents with  . Fall  . Hip Pain       Brief HPI   Traci Hale is a 81 y.o. female with history of chronic systolic heart failure last year measured was 35-40%, hypertension, chronic anemia, dyslipidemia was brought to the ER after patient had a fall at the backyard of her house. Patient states she still been fairly not hit her head or lose consciousness. In the ER x-rays revealed right hip fracture and Dr. Magnus Ivan on call orthopedic surgeon was consulted. Patient's chest x-ray showed infiltrates versus edema and patient was requiring at least 3 L of O2 in ED.  Patient reported some shortness of breath over the last few days. She denied any chest pain, fevers or chills. Patient's troponinwas mildly elevated and Dr. Tresa Endo on-call cardiologist was consulted. Patient was admitted for further management. Patient denied any headache visual symptoms nausea vomiting abdominal pain and diarrhea   Assessment & Plan    Principal Problem:   Femoral neck fracture/right hip fracture after mechanical fall - Appreciate cardiology and orthopedics recommendations., Currently high risk and medical status needs to be optimized prior to surgery - Continue pain control  Active Problems: NSTEMI: - Cardiology consulted, patient has significant increase in the troponins, last one 6.09, currently denying any chest pain - Cardiology following, currently too high risk for surgery. Also per cardiology, may not be a candidate for invasive ischemic evaluation, continue to diurese. - 2-D echo EF of 35-40% with diffuse hypokinesis, grade 2  diastolic dysfunction - Currently on Lasix, nifedipine, Coreg, statin.   Acute hypoxic respiratory failure with acute on chronic combined systolic and diastolic CHF: Currently O2 sats 100% on 4 L - 2-D echo EF of 35-40% with diffuse hypokinesis, grade 2 diastolic dysfunction, no significant change in the EF from the prior echo -  Continue Lasix, negative balance of 2.3 L, no significant improvement in the weight - Continue strict I's and O's and daily weights - Chest x-ray with possible infiltrates versus edema, patient was placed on IV antibiotics  Uncontrolled hypertension - Continue Lasix, lisinopril, Coreg, nifedipine, hydralazine  Chronic anemia - Closely monitor, keep hemoglobin > 10 due to NSTEMI  Code Status: dnr   Family Communication: Discussed in detail with the patient, all imaging results, lab results explained to the patient and daughter at the bedside   Disposition Plan:   Time Spent in minutes  25 minutes  Procedures  2-D echocardiogram Chest x-ray  Consults   Cardiology  Orthopedics   DVT Prophylaxis  SCD's  Medications  Scheduled Meds: . carvedilol  3.125 mg Oral BID WC  . cefTAZidime (FORTAZ)  IV  2 g Intravenous Q12H  . cholecalciferol  5,000 Units  Oral Daily  . folic acid-pyridoxine-cyancobalamin  1 tablet Oral Daily  . furosemide  40 mg Oral BID  . lisinopril  2.5 mg Oral Daily  . NIFEdipine  60 mg Oral Daily  . omega-3 acid ethyl esters  1 capsule Oral Daily  . simvastatin  20 mg Oral Daily  . vancomycin  500 mg Intravenous Q12H   Continuous Infusions:  PRN Meds:.hydrALAZINE, HYDROcodone-acetaminophen, morphine injection   Antibiotics   Anti-infectives    Start     Dose/Rate Route Frequency Ordered Stop   09/03/14 0600  cefTAZidime (FORTAZ) 2 g in dextrose 5 % 50 mL IVPB     2 g 100 mL/hr over 30 Minutes Intravenous Every 12 hours 09/03/14 0134     09/03/14 0200  vancomycin (VANCOCIN) 500 mg in sodium chloride 0.9 % 100 mL IVPB      500 mg 100 mL/hr over 60 Minutes Intravenous Every 12 hours 09/03/14 0126     09/02/14 2215  cefTRIAXone (ROCEPHIN) 1 g in dextrose 5 % 50 mL IVPB     1 g 100 mL/hr over 30 Minutes Intravenous  Once 09/02/14 2201 09/03/14 1224   09/02/14 2215  azithromycin (ZITHROMAX) 500 mg in dextrose 5 % 250 mL IVPB     500 mg 250 mL/hr over 60 Minutes Intravenous  Once 09/02/14 2201 09/03/14 1224        Subjective:   Traci Hale was seen and examined today. Pain in the right hip otherwise no chest pain or shortness of breath. Patient denies dizziness,  abdominal pain, N/V/D/C, new weakness, numbess, tingling. No acute events overnight.    Objective:   Blood pressure 131/84, pulse 86, temperature 97.4 F (36.3 C), temperature source Oral, resp. rate 20, height  (1.651 m), weight 56.9 kg (125 lb 7.1 oz), SpO2 100 %.  Wt Readings from Last 3 Encounters:  09/04/14 56.9 kg (125 lb 7.1 oz)  06/12/14 58.6 kg (129 lb 3 oz)  01/10/14 49.42 kg (108 lb 15.2 oz)     Intake/Output Summary (Last 24 hours) at 09/04/14 1356 Last data filed at 09/04/14 0930  Gross per 24 hour  Intake    660 ml  Output   2100 ml  Net  -1440 ml    Exam  General: Alert and oriented x 2, NAD  HEENT:  PERRLA, EOMI, Anicteic Sclera, mucous membranes moist.   Neck: Supple, no JVD, no masses  CVS: S1 S2 clear  Respiratory: Bibasilar Rales   Abdomen: Soft, nontender, nondistended, + bowel sounds  Ext: no cyanosis clubbing or edema  Neuro: no new deficits  Skin: No rashes  Psych: Normal affect and demeanor, alert and oriented x2   Data Review   Micro Results No results found for this or any previous visit (from the past 240 hour(s)).  Radiology Reports Dg Chest 2 View  09/02/2014   CLINICAL DATA:  Fall today.  Hip fx. Htn. Chf  EXAM: CHEST - 2 VIEW  COMPARISON:  07/22/2014  FINDINGS: New interstitial and airspace opacities involving upper lung zones more than bases, right greater than left. Mild  cardiomegaly stable. Tortuous atheromatous aorta. No pneumothorax. No effusion. Visualized skeletal structures are unremarkable.  IMPRESSION: 1. New asymmetric infiltrates or edema as above. 2. Stable mild cardiomegaly   Electronically Signed   By: Corlis Leak M.D.   On: 09/02/2014 19:50   Dg Hip Unilat  With Pelvis 2-3 Views Right  09/02/2014   CLINICAL DATA:  Pain post fall at home today  EXAM: RIGHT HIP (WITH PELVIS) 2-3 VIEWS  COMPARISON:  06/10/2014  FINDINGS: Midcervical fracture of the right femur, impacted with some displacement and foreshortening. No dislocation. IM rod and sliding pin fixation hardware across the left femoral neck as before. Bony pelvis intact. Degenerative disc disease in the visualized lower lumbar spine.  IMPRESSION: 1. Displaced midcervical right femur fracture fracture.   Electronically Signed   By: Corlis Leak  Hassell M.D.   On: 09/02/2014 17:30   Dg Femur, Min 2 Views Right  09/02/2014   CLINICAL DATA:  Proximal RIGHT femur fracture.  EXAM: RIGHT FEMUR 2 VIEWS  COMPARISON:  None.  FINDINGS: Distal femur is intact. Tricompartmental osteoarthritis of the knee is incidentally noted.  IMPRESSION: Intact distal femur.   Electronically Signed   By: Andreas NewportGeoffrey  Lamke M.D.   On: 09/02/2014 20:17    CBC  Recent Labs Lab 09/02/14 1835 09/03/14 0156  WBC 5.6 5.5  HGB 11.0* 11.8*  HCT 34.9* 36.6  PLT 369 386  MCV 105.1* 105.2*  MCH 33.1 33.9  MCHC 31.5 32.2  RDW 14.6 14.7  LYMPHSABS 0.5* 0.8  MONOABS 0.2 0.4  EOSABS 0.0 0.0  BASOSABS 0.0 0.0    Chemistries   Recent Labs Lab 09/02/14 1835 09/03/14 0156 09/04/14 0427  NA 143 141 139  K 3.6 4.1 3.2*  CL 112* 109 103  CO2 25 24 28   GLUCOSE 123* 154* 106*  BUN 18 19 26*  CREATININE 0.82 0.84 0.87  CALCIUM 8.9 8.9 8.5*  AST  --  33  --   ALT  --  12*  --   ALKPHOS  --  62  --   BILITOT  --  0.4  --     ------------------------------------------------------------------------------------------------------------------ estimated creatinine clearance is 40.1 mL/min (by C-G formula based on Cr of 0.87). ------------------------------------------------------------------------------------------------------------------ No results for input(s): HGBA1C in the last 72 hours. ------------------------------------------------------------------------------------------------------------------ No results for input(s): CHOL, HDL, LDLCALC, TRIG, CHOLHDL, LDLDIRECT in the last 72 hours. ------------------------------------------------------------------------------------------------------------------ No results for input(s): TSH, T4TOTAL, T3FREE, THYROIDAB in the last 72 hours.  Invalid input(s): FREET3 ------------------------------------------------------------------------------------------------------------------ No results for input(s): VITAMINB12, FOLATE, FERRITIN, TIBC, IRON, RETICCTPCT in the last 72 hours.  Coagulation profile  Recent Labs Lab 09/03/14 0156  INR 1.22    No results for input(s): DDIMER in the last 72 hours.  Cardiac Enzymes  Recent Labs Lab 09/03/14 0156 09/03/14 0650 09/03/14 1233  TROPONINI 2.14* 5.87* 6.09*   ------------------------------------------------------------------------------------------------------------------ Invalid input(s): POCBNP   Recent Labs  09/03/14 1203 09/03/14 1728 09/03/14 2332  GLUCAP 115* 123* 123*     RAI,RIPUDEEP M.D. [facility] 09/04/2014, 1:56 PM  Pager: [phone]   Between 7am to 7pm - call Pager - [phone]  After 7pm go to [email or web address] - password [email or web address]  Call night coverage person covering after 7pm

## 2014-09-04 NOTE — Progress Notes (Signed)
Patient Profile: 81 yo woman with PMH of systolic HF (EF 35-40% 01/2014), hypertension, dyslipidemia, GERD and recent hospitalization from 3/9-3/17 for PNA/sepsis requiring mechanical ventilation who had an episode of atrial fibrillation with RVR in the hospital, who was re-admitted 09/02/14 for right hip fracture after a mechanical fall. Also with elevated troponins 1.03-->2.14 -->6.09 and increased O2 requirements.    Subjective: No chest pain, more upset that she was awakened from sleep for BK.    Objective: Vital signs in last 24 hours: Temp:  [97.4 F (36.3 C)-98.9 F (37.2 C)] 97.4 F (36.3 C) (06/03 0435) Pulse Rate:  [85-96] 86 (06/03 0435) Resp:  [20-28] 20 (06/03 0435) BP: (131-155)/(65-90) 131/84 mmHg (06/03 0435) SpO2:  [98 %-100 %] 100 % (06/03 0435) Weight:  [125 lb 7.1 oz (56.9 kg)] 125 lb 7.1 oz (56.9 kg) (06/03 0435) Weight change: 7.1 oz (0.2 kg) Last BM Date: 09/02/14 Intake/Output from previous day: -2140 ( since admit -2490) wt 125.7 down 7 oz.  06/02 0701 - 06/03 0700 In: 660 [P.O.:360; IV Piggyback:300] Out: 2900 [Urine:2900] Intake/Output this shift:    PE: General:Pleasant affect, NAD Skin:Warm and dry, brisk capillary refill HEENT:normocephalic, sclera clear, mucus membranes moist Neck:supple, + mild JVD  Heart:S1S2 RRR with 2-3/6 systolic murmur, no gallup, rub or click Lungs:clear without rales, rhonchi, or wheezes WUJ:WJXB, non tender, + BS, do not palpate liver spleen or masses Ext:no lower ext edema, 2+ pedal pulses, 2+ radial pulses Neuro:alert and oriented, MAE, follows commands, + facial symmetry Tele: SR with PACS short burst of PACs, and occ PVC    Lab Results:  Recent Labs  09/02/14 1835 09/03/14 0156  WBC 5.6 5.5  HGB 11.0* 11.8*  HCT 34.9* 36.6  PLT 369 386   BMET  Recent Labs  09/03/14 0156 09/04/14 0427  NA 141 139  K 4.1 3.2*  CL 109 103  CO2 24 28  GLUCOSE 154* 106*  BUN 19 26*  CREATININE 0.84 0.87    CALCIUM 8.9 8.5*    Recent Labs  09/03/14 0650 09/03/14 1233  TROPONINI 5.87* 6.09*    No results found for: CHOL, HDL, LDLCALC, LDLDIRECT, TRIG, CHOLHDL No results found for: JYNW2N   Lab Results  Component Value Date   TSH 1.000 06/13/2014    Hepatic Function Panel  Recent Labs  09/03/14 0156  PROT 8.3*  ALBUMIN 3.4*  AST 33  ALT 12*  ALKPHOS 62  BILITOT 0.4   No results for input(s): CHOL in the last 72 hours. No results for input(s): PROTIME in the last 72 hours.     Studies/Results: Dg Chest 2 View  09/02/2014   CLINICAL DATA:  Fall today.  Hip fx. Htn. Chf  EXAM: CHEST - 2 VIEW  COMPARISON:  07/22/2014  FINDINGS: New interstitial and airspace opacities involving upper lung zones more than bases, right greater than left. Mild cardiomegaly stable. Tortuous atheromatous aorta. No pneumothorax. No effusion. Visualized skeletal structures are unremarkable.  IMPRESSION: 1. New asymmetric infiltrates or edema as above. 2. Stable mild cardiomegaly   Electronically Signed   By: Corlis Leak M.D.   On: 09/02/2014 19:50   Dg Hip Unilat  With Pelvis 2-3 Views Right  09/02/2014   CLINICAL DATA:  Pain post fall at home today  EXAM: RIGHT HIP (WITH PELVIS) 2-3 VIEWS  COMPARISON:  06/10/2014  FINDINGS: Midcervical fracture of the right femur, impacted with some displacement and foreshortening. No dislocation. IM rod and sliding pin fixation  hardware across the left femoral neck as before. Bony pelvis intact. Degenerative disc disease in the visualized lower lumbar spine.  IMPRESSION: 1. Displaced midcervical right femur fracture fracture.   Electronically Signed   By: Corlis Leak  Hassell M.D.   On: 09/02/2014 17:30   Dg Femur, Min 2 Views Right  09/02/2014   CLINICAL DATA:  Proximal RIGHT femur fracture.  EXAM: RIGHT FEMUR 2 VIEWS  COMPARISON:  None.  FINDINGS: Distal femur is intact. Tricompartmental osteoarthritis of the knee is incidentally noted.  IMPRESSION: Intact distal femur.    Electronically Signed   By: Andreas NewportGeoffrey  Lamke M.D.   On: 09/02/2014 20:17   Echo: Left ventricle: Systolic function was moderately reduced. The estimated ejection fraction was in the range of 35% to 40%. Diffuse hypokinesis with no identifiable regional variations. Features are consistent with a pseudonormal left ventricular filling pattern, with concomitant abnormal relaxation and increased filling pressure (grade 2 diastolic dysfunction). - Aortic valve: There was trivial regurgitation. - Mitral valve: Calcified annulus. There was moderate regurgitation directed centrally. - Left atrium: The atrium was severely dilated. - Atrial septum: The septum bowed from left to right, consistent with increased left atrial pressure. - Tricuspid valve: Mild prolapse. There was mild-moderate regurgitation directed centrally. - Pulmonary arteries: Systolic pressure was moderately increased. PA peak pressure: 56 mm Hg (S).  Medications: I have reviewed the patient's current medications. Scheduled Meds: . carvedilol  3.125 mg Oral BID WC  . cefTAZidime (FORTAZ)  IV  2 g Intravenous Q12H  . cholecalciferol  5,000 Units Oral Daily  . folic acid-pyridoxine-cyancobalamin  1 tablet Oral Daily  . furosemide  40 mg Intravenous Q12H  . lisinopril  2.5 mg Oral Daily  . NIFEdipine  60 mg Oral Daily  . omega-3 acid ethyl esters  1 capsule Oral Daily  . simvastatin  20 mg Oral Daily  . vancomycin  500 mg Intravenous Q12H   Continuous Infusions:  PRN Meds:.hydrALAZINE, HYDROcodone-acetaminophen, morphine injection  Assessment/Plan: Principal Problem:   Femoral neck fracture Active Problems:   Acute on chronic systolic and diastolic heart failure, NYHA class 3   HTN (hypertension)   Hyperlipidemia   Hypokalemia   Chronic combined systolic and diastolic heart failure, NYHA class 1   Acute respiratory failure with hypoxia   Hip fracture   Elevated troponin   Fall   NSTEMI (non-ST  elevated myocardial infarction)   1. NSTEMI: significant serial increases in serial troponins from 1.03-->2.14-->5.87-->6.09. She denies any chest pain but presented with increased O2 requirements, now at 4L [place]. She appears to be comfortable at rest. She has known systolic HF with EF of 35-40% on previous echo and on current echo.  Ischemic w/u was not pursued at time of initial diagnosis given advanced age and lack of symptoms. Her admission EKG showed abnormal T waves/ inversions in the anterolateral leads, more pronounced than prior EKGs. Given rise in troponins,  12 lead repeated with mild elevation of st in V1-V2 and no change in t wave inversions, + LVH as well  and 2D echo done see results.. Given significant troponin elevation and systolic dysfunction, she is high risk for surgery. She is not be a candidate for invasive ischemic eval, but we will try to optimize her acute  CHF and continue to diuresis. Now negative 2490 since admit. ( pt was down in yard with 2 dogs and ants crawling on her at time of fall)   2. CHF: systolic and diastolic HF, with EF now as previously  EF 35-40% with G2DD. With increased O2 requirements. Sats stable on 4L. No labored breathing on my exam.  First dose given at 0500. . Monitor response. Is on ACE and BB. Lungs improved.  now negative 2490 since admit. Also moderate MR and mild-mod TR and elevated PA pk pressure at 56 mmHg  3. Right Hip Fracture: Surgery recommended by ortho. Will try to optimize her HF prior to OR. She is High Risk.   4. Hypokalemia - replace K+   LOS: 2 days   Time spent with pt. :15 minutes. [facility]  Nurse Practitioner Certified Pager [phone] or after 5pm and on weekends call [phone] 09/04/2014, 9:24 AM   Patient seen and examined and history reviewed. Agree with above findings and plan. She is doing much better today. More alert. Denies any dyspnea or chest discomfort. Excellent diuresis with lasix yesterday. Negative 2  liters. Ecg is stable with lateral ischemia. Need to replete potassium. Echo yesterday was unchanged from October with EF 35-40%. BP and heart rhythm are stable. She is at high risk for surgery with recent cardiac events but I believe she is optimized at this point and can proceed with surgery when Ortho thinks suitable. We will continue to follow to optimize her cardiac therapy post op. Will switch lasix to po today.  Peter [place], MDFACC 09/04/2014 10:18 AM

## 2014-09-05 DIAGNOSIS — I255 Ischemic cardiomyopathy: Secondary | ICD-10-CM

## 2014-09-05 DIAGNOSIS — L8990 Pressure ulcer of unspecified site, unspecified stage: Secondary | ICD-10-CM | POA: Insufficient documentation

## 2014-09-05 LAB — CBC
HCT: 34.1 % — ABNORMAL LOW (ref 36.0–46.0)
HEMOGLOBIN: 10.9 g/dL — AB (ref 12.0–15.0)
MCH: 32.8 pg (ref 26.0–34.0)
MCHC: 32 g/dL (ref 30.0–36.0)
MCV: 102.7 fL — AB (ref 78.0–100.0)
PLATELETS: 274 10*3/uL (ref 150–400)
RBC: 3.32 MIL/uL — ABNORMAL LOW (ref 3.87–5.11)
RDW: 14.3 % (ref 11.5–15.5)
WBC: 6.1 10*3/uL (ref 4.0–10.5)

## 2014-09-05 LAB — BASIC METABOLIC PANEL
ANION GAP: 10 (ref 5–15)
BUN: 29 mg/dL — ABNORMAL HIGH (ref 6–20)
CO2: 29 mmol/L (ref 22–32)
CREATININE: 0.92 mg/dL (ref 0.44–1.00)
Calcium: 8.8 mg/dL — ABNORMAL LOW (ref 8.9–10.3)
Chloride: 98 mmol/L — ABNORMAL LOW (ref 101–111)
GFR calc Af Amer: >60 mL/min (ref >60)
GFR, EST NON AFRICAN AMERICAN: 54 mL/min — AB (ref >60)
Glucose, Bld: 90 mg/dL (ref 65–99)
Potassium: 3.6 mmol/L (ref 3.5–5.1)
SODIUM: 137 mmol/L (ref 135–145)

## 2014-09-05 LAB — PROCALCITONIN: Procalcitonin: 0.13 ng/mL

## 2014-09-05 LAB — GLUCOSE, CAPILLARY
Glucose-Capillary: 88 mg/dL (ref 65–99)
Glucose-Capillary: 119 mg/dL — ABNORMAL HIGH (ref 65–99)

## 2014-09-05 LAB — VANCOMYCIN, TROUGH: Vancomycin Tr: 15 ug/mL (ref 10.0–20.0)

## 2014-09-05 NOTE — Progress Notes (Signed)
Patient ID: Traci Hale, female   DOB: Apr 27, 1924, 81 y.o.   MRN: [id number] I have spoken to Traci Hale and her daughters in great detail.  They understand fully her situation and the potential for adverse cardiac events with surgery.  However, her pain is quite extensive and they understand also the risks involved with non-operative treatment and immobility.  Surgery in planned for tomorrow am 6/5 for a right hip hemiarthroplasty vs total hip.

## 2014-09-05 NOTE — Progress Notes (Signed)
SUBJECTIVE: Resting.  Currently denies CP or SOB.  Marland Kitchen. carvedilol  3.125 mg Oral BID WC  . cefTAZidime (FORTAZ)  IV  2 g Intravenous Q12H  . cholecalciferol  5,000 Units Oral Daily  . folic acid-pyridoxine-cyancobalamin  1 tablet Oral Daily  . furosemide  40 mg Oral BID  . lisinopril  2.5 mg Oral Daily  . NIFEdipine  60 mg Oral Daily  . omega-3 acid ethyl esters  1 capsule Oral Daily  . simvastatin  20 mg Oral Daily  . vancomycin  500 mg Intravenous Q12H      OBJECTIVE: Physical Exam: Filed Vitals:   09/04/14 2117 09/05/14 0244 09/05/14 0508 09/05/14 0700  BP: 99/46 110/55 122/65   Pulse: 77 70 76   Temp: 99.2 F (37.3 C) 98.6 F (37 C) 97.7 F (36.5 C)   TempSrc: Oral Oral Oral   Resp: 18 18 18    Height:      Weight:    61.6 kg (135 lb 12.9 oz)  SpO2: 100% 100% 100%     Intake/Output Summary (Last 24 hours) at 09/05/14 [id number] Last data filed at 09/05/14 0700  Gross per 24 hour  Intake   1160 ml  Output   2300 ml  Net  -1140 ml    Telemetry reveals sinus rhythm  GEN- The patient is frail and fragile, alert and oriented x 3 today.   Head- normocephalic, atraumatic Eyes-  Sclera clear, conjunctiva pink Ears- hearing intact Oropharynx- clear Neck- supple  Lungs- few basilar rales, normal work of breathing Heart- Regular rate and rhythm, LV heave, harsh 2/6 SEM at the apex GI- soft, NT, ND, + BS Extremities- no clubbing, cyanosis, or edema Skin- no rash or lesion   LABS: Basic Metabolic Panel:  Recent Labs  96/07/5404/03/16 0427 09/05/14 0510  NA 139 137  K 3.2* 3.6  CL 103 98*  CO2 28 29  GLUCOSE 106* 90  BUN 26* 29*  CREATININE 0.87 0.92  CALCIUM 8.5* 8.8*   Liver Function Tests:  Recent Labs  09/03/14 0156  AST 33  ALT 12*  ALKPHOS 62  BILITOT 0.4  PROT 8.3*  ALBUMIN 3.4*   CBC:  Recent Labs  09/02/14 1835 09/03/14 0156 09/05/14 0510  WBC 5.6 5.5 6.1  NEUTROABS 4.9 4.3  --   HGB 11.0* 11.8* 10.9*  HCT 34.9* 36.6 34.1*  MCV 105.1*  105.2* 102.7*  PLT 369 386 274   Cardiac Enzymes:  Recent Labs  09/03/14 0156 09/03/14 0650 09/03/14 1233  TROPONINI 2.14* 5.87* 6.09*   ASSESSMENT AND PLAN:  Principal Problem:   Femoral neck fracture Active Problems:   Acute on chronic systolic and diastolic heart failure, NYHA class 3   HTN (hypertension)   Hyperlipidemia   Hypokalemia   Chronic combined systolic and diastolic heart failure, NYHA class 1   Acute respiratory failure with hypoxia   Hip fracture   Elevated troponin   Fall   NSTEMI (non-ST elevated myocardial infarction)  1. NSTEMI Robust elevation in cardiac markers with diffuse TWI on ekg. Currently chest pain free. I agree with Dr SwazilandJordan that given her advanced age and comoribidites that she is not a candidate for cardiology procedures. Would treat conservatively.  2. Ischemic CM EF 35-40% Gentle diuresis as able On coreg  3. Hip fx Recurrent history of prior falls.  Given acute on chronic systolic CHF and NSTEMI she will be high risk for general anesthesia and surgery.  She is not a candidate for invasive  ischemic evaluation with cardiac cath.   Decision concerning surgery will need to weigh risk versus quality of life (pain control and mobility).  I doubt that we can further optimize.  Proceed with surgery if medically indicated vs palliative management strategies as determined by ortho/ primary team.   Hillis Range, MD 09/05/2014 8:21 AM

## 2014-09-05 NOTE — Progress Notes (Signed)
[facility]                                                                              Patient Demographics  Traci Hale, is a 81 y.o. female, DOB - [birth year], ZOX:[id number]  Admit date - 09/02/2014   Admitting Physician Eduard Clos, MD  Outpatient Primary MD for the patient is FULP, CAMMIE, MD  LOS - 3   Chief Complaint  Patient presents with  . Fall  . Hip Pain       Brief HPI   Traci Hale is a 81 y.o. female with history of chronic systolic heart failure last year measured was 35-40%, hypertension, chronic anemia, dyslipidemia was brought to the ER after patient had a fall at the backyard of her house. Patient states she still been fairly not hit her head or lose consciousness. In the ER x-rays revealed right hip fracture and Dr. Magnus Ivan on call orthopedic surgeon was consulted. Patient's chest x-ray showed infiltrates versus edema and patient was requiring at least 3 L of O2 in ED.  Patient reported some shortness of breath over the last few days. She denied any chest pain, fevers or chills. Patient's troponinwas mildly elevated and Dr. Tresa Endo on-call cardiologist was consulted. Patient was admitted for further management. Patient denied any headache visual symptoms nausea vomiting abdominal pain and diarrhea   Assessment & Plan    Principal Problem:   Femoral neck fracture/right hip fracture after mechanical fall - Appreciate cardiology and orthopedics recommendations., Currently high risk  - Per cardiology today, decision concerning surgery. Need to weigh risk versus quality of life, doubt if we can further optimize, proceed with surgery if medically indicated versus palliative management  - Continue pain control - Discussed with the daughter at the bedside, family wants to proceed with the surgery   Active Problems: NSTEMI: - Cardiology consulted, patient has significant increase in the troponins, last one 6.09, currently denying any  chest pain - Cardiology following, currently too high risk for surgery. Also per cardiology, may not be a candidate for invasive ischemic evaluation, continue to diurese. - 2-D echo EF of 35-40% with diffuse hypokinesis, grade 2 diastolic dysfunction - Currently on Lasix, nifedipine, Coreg, statin.   Acute hypoxic respiratory failure with acute on chronic combined systolic and diastolic CHF: Currently O2 sats 100% on 4 L - 2-D echo EF of 35-40% with diffuse hypokinesis, grade 2 diastolic dysfunction, no significant change in the EF from the prior echo -  Continue Lasix, negative balance of 3.6 L - Continue strict I's and O's and daily weights - Chest x-ray with possible infiltrates versus edema, patient was placed on IV antibiotics  Uncontrolled hypertension - Continue Lasix, lisinopril, Coreg, nifedipine, hydralazine  Chronic anemia - Closely monitor, keep hemoglobin > 10 due to NSTEMI  Code Status: dnr   Family Communication: Discussed in detail with the patient, all imaging results, lab results explained to the patient and daughter at the bedside   Disposition Plan:   Time Spent in minutes  25 minutes  Procedures  2-D echocardiogram Chest x-ray  Consults   Cardiology  Orthopedics   DVT Prophylaxis  SCD's  Medications  Scheduled Meds: . carvedilol  3.125 mg Oral BID WC  . cefTAZidime (FORTAZ)  IV  2 g Intravenous Q12H  . cholecalciferol  5,000 Units Oral Daily  . folic acid-pyridoxine-cyancobalamin  1 tablet Oral Daily  . furosemide  40 mg Oral BID  . lisinopril  2.5 mg Oral Daily  . NIFEdipine  60 mg Oral Daily  . omega-3 acid ethyl esters  1 capsule Oral Daily  . simvastatin  20 mg Oral Daily  . vancomycin  500 mg Intravenous Q12H   Continuous Infusions:  PRN Meds:.hydrALAZINE, HYDROcodone-acetaminophen, morphine injection   Antibiotics   Anti-infectives    Start     Dose/Rate Route Frequency Ordered Stop   09/03/14 0600  cefTAZidime (FORTAZ) 2 g in  dextrose 5 % 50 mL IVPB     2 g 100 mL/hr over 30 Minutes Intravenous Every 12 hours 09/03/14 0134     09/03/14 0200  vancomycin (VANCOCIN) 500 mg in sodium chloride 0.9 % 100 mL IVPB     500 mg 100 mL/hr over 60 Minutes Intravenous Every 12 hours 09/03/14 0126     09/02/14 2215  cefTRIAXone (ROCEPHIN) 1 g in dextrose 5 % 50 mL IVPB     1 g 100 mL/hr over 30 Minutes Intravenous  Once 09/02/14 2201 09/03/14 1224   09/02/14 2215  azithromycin (ZITHROMAX) 500 mg in dextrose 5 % 250 mL IVPB     500 mg 250 mL/hr over 60 Minutes Intravenous  Once 09/02/14 2201 09/03/14 1224        Subjective:   Traci Hale was seen and examined today. Pain in the right hip, 4/10, denied any chest pain or shortness of breath. Daughter at the bedside. Patient denies dizziness,  abdominal pain, N/V/D/C, new weakness, numbess, tingling. No acute events overnight.    Objective:   Blood pressure 122/65, pulse 76, temperature 97.7 F (36.5 C), temperature source Oral, resp. rate 18, height 5\' 5"  (1.651 m), weight 61.6 kg (135 lb 12.9 oz), SpO2 100 %.  Wt Readings from Last 3 Encounters:  09/05/14 61.6 kg (135 lb 12.9 oz)  06/12/14 58.6 kg (129 lb 3 oz)  01/10/14 49.42 kg (108 lb 15.2 oz)     Intake/Output Summary (Last 24 hours) at 09/05/14 1036 Last data filed at 09/05/14 0700  Gross per 24 hour  Intake   1040 ml  Output   2300 ml  Net  -1260 ml    Exam  General: Alert and oriented x 2, NAD  HEENT:  PERRLA, EOMI, Anicteic Sclera  Neck: Supple, no JVD, no masses  CVS: S1 S2 clear  Respiratory: CTAB  Abdomen: Soft, NT, ND, NBS  Ext: no cyanosis clubbing or edema  Neuro: no new deficits  Skin: No rashes  Psych: Normal affect and demeanor, alert and oriented x2   Data Review   Micro Results No results found for this or any previous visit (from the past 240 hour(s)).  Radiology Reports Dg Chest 2 View  09/02/2014   CLINICAL DATA:  Fall today.  Hip fx. Htn. Chf  EXAM: CHEST - 2  VIEW  COMPARISON:  07/22/2014  FINDINGS: New interstitial and airspace opacities involving upper lung zones more than bases, right greater than left. Mild cardiomegaly stable. Tortuous atheromatous aorta. No pneumothorax. No effusion. Visualized skeletal structures are unremarkable.  IMPRESSION: 1. New asymmetric infiltrates or edema as above. 2. Stable mild cardiomegaly   Electronically Signed   By: Ronald Pippins  Hassell M.D.  On: 09/02/2014 19:50   Dg Hip Unilat  With Pelvis 2-3 Views Right  09/02/2014   CLINICAL DATA:  Pain post fall at home today  EXAM: RIGHT HIP (WITH PELVIS) 2-3 VIEWS  COMPARISON:  06/10/2014  FINDINGS: Midcervical fracture of the right femur, impacted with some displacement and foreshortening. No dislocation. IM rod and sliding pin fixation hardware across the left femoral neck as before. Bony pelvis intact. Degenerative disc disease in the visualized lower lumbar spine.  IMPRESSION: 1. Displaced midcervical right femur fracture fracture.   Electronically Signed   By: Corlis Leak M.D.   On: 09/02/2014 17:30   Dg Femur, Min 2 Views Right  09/02/2014   CLINICAL DATA:  Proximal RIGHT femur fracture.  EXAM: RIGHT FEMUR 2 VIEWS  COMPARISON:  None.  FINDINGS: Distal femur is intact. Tricompartmental osteoarthritis of the knee is incidentally noted.  IMPRESSION: Intact distal femur.   Electronically Signed   By: Andreas Newport M.D.   On: 09/02/2014 20:17    CBC  Recent Labs Lab 09/02/14 1835 09/03/14 0156 09/05/14 0510  WBC 5.6 5.5 6.1  HGB 11.0* 11.8* 10.9*  HCT 34.9* 36.6 34.1*  PLT 369 386 274  MCV 105.1* 105.2* 102.7*  MCH 33.1 33.9 32.8  MCHC 31.5 32.2 32.0  RDW 14.6 14.7 14.3  LYMPHSABS 0.5* 0.8  --   MONOABS 0.2 0.4  --   EOSABS 0.0 0.0  --   BASOSABS 0.0 0.0  --     Chemistries   Recent Labs Lab 09/02/14 1835 09/03/14 0156 09/04/14 0427 09/05/14 0510  NA 143 141 139 137  K 3.6 4.1 3.2* 3.6  CL 112* 109 103 98*  CO2 GLUCOSE 123* 154* 106* 90  BUN 18  19 26* 29*  CREATININE 0.82 0.84 0.87 0.92  CALCIUM 8.9 8.9 8.5* 8.8*  AST  --  33  --   --   ALT  --  12*  --   --   ALKPHOS  --  62  --   --   BILITOT  --  0.4  --   --    ------------------------------------------------------------------------------------------------------------------ estimated creatinine clearance is 38 mL/min (by C-G formula based on Cr of 0.92). ------------------------------------------------------------------------------------------------------------------ No results for input(s): HGBA1C in the last 72 hours. ------------------------------------------------------------------------------------------------------------------ No results for input(s): CHOL, HDL, LDLCALC, TRIG, CHOLHDL, LDLDIRECT in the last 72 hours. ------------------------------------------------------------------------------------------------------------------ No results for input(s): TSH, T4TOTAL, T3FREE, THYROIDAB in the last 72 hours.  Invalid input(s): FREET3 ------------------------------------------------------------------------------------------------------------------ No results for input(s): VITAMINB12, FOLATE, FERRITIN, TIBC, IRON, RETICCTPCT in the last 72 hours.  Coagulation profile  Recent Labs Lab 09/03/14 0156  INR 1.22    No results for input(s): DDIMER in the last 72 hours.  Cardiac Enzymes  Recent Labs Lab 09/03/14 0156 09/03/14 0650 09/03/14 1233  TROPONINI 2.14* 5.87* 6.09*   ------------------------------------------------------------------------------------------------------------------ Invalid input(s): POCBNP   Recent Labs  09/03/14 1728 09/03/14 2332 09/04/14 1151 09/04/14 1715 09/04/14 2333 09/05/14 0554  GLUCAP 123* 123* 99 101* 107* 88     RAI,RIPUDEEP M.D. [facility] 09/05/2014, 10:36 AM  Pager: [phone]   Between 7am to 7pm - call Pager - [phone]  After 7pm go to [email or web address] - password [email or web address]  Call night coverage person  covering after 7pm

## 2014-09-05 NOTE — Progress Notes (Signed)
ANTIBIOTIC CONSULT NOTE - INITIAL  Pharmacy Consult for Fortaz/Vancomycin Indication: HCAP  No Known Allergies  Patient Measurements: Height: 5\' 5"  (165.1 cm) Weight: 135 lb 12.9 oz (61.6 kg) IBW/kg (Calculated) : 57  Vital Signs: Temp: 97.7 F (36.5 C) (06/04 0508) Temp Source: Oral (06/04 0508) BP: 122/65 mmHg (06/04 0508) Pulse Rate: 76 (06/04 0508)  Labs:  Recent Labs  09/02/14 1835 09/03/14 0156 09/04/14 0427 09/05/14 0510  WBC 5.6 5.5  --  6.1  HGB 11.0* 11.8*  --  10.9*  PLT 369 386  --  274  CREATININE 0.82 0.84 0.87 0.92   Estimated Creatinine Clearance: 38 mL/min (by C-G formula based on Cr of 0.92).  Recent Labs  09/05/14 1330  VANCOTROUGH 15    Microbiology: No results found for this or any previous visit (from the past 720 hour(s)).  Medical History: Past Medical History  Diagnosis Date  . Hypertension   . CHF (congestive heart failure)   . Hyperlipidemia   . Heart murmur   . Shortness of breath   . GERD (gastroesophageal reflux disease)    Medications:  Scheduled:  . carvedilol  3.125 mg Oral BID WC  . cefTAZidime (FORTAZ)  IV  2 g Intravenous Q12H  . cholecalciferol  5,000 Units Oral Daily  . folic acid-pyridoxine-cyancobalamin  1 tablet Oral Daily  . furosemide  40 mg Oral BID  . lisinopril  2.5 mg Oral Daily  . NIFEdipine  60 mg Oral Daily  . omega-3 acid ethyl esters  1 capsule Oral Daily  . simvastatin  20 mg Oral Daily  . vancomycin  500 mg Intravenous Q12H   Assessment: [age] yoF s/p fall at home, plan for surgery 6/5.  Fortaz and Vancomycin per Rx for HCAP, CXray 6/1: new bilateral infiltrates R > L  6/1 >>rocephin  >>6/2 6/1 >>zmax  >>  6/2 6/2 >>fortaz >> 6/2 >>vancomycin >>  6/4 trough prior to 6th dose of 500mg  q12 = 15 mcg/ml Renal function stable, Cl ~ 40 ml/min  Goal of Therapy:  Vancomycin trough level 15-20 mcg/ml  Plan:   No change Fortaz 2Gm IV q12h  No change Vancomycin 500mg  IV q12h  Plan surgery  tomorrow, hemiarthroplasty vs THA  Otho BellowsGreen, Vanity Larsson L PharmD Pager [phone] 09/05/2014, 2:36 PM

## 2014-09-05 NOTE — Anesthesia Preprocedure Evaluation (Addendum)
Anesthesia Evaluation  Patient identified by MRN, date of birth, ID band Patient awake    Reviewed: Allergy & Precautions, H&P , NPO status , Patient's Chart, lab work & pertinent test results, reviewed documented beta blocker date and time   Airway Mallampati: II  TM Distance: >3 FB Neck ROM: full    Dental  (+) Edentulous Upper, Edentulous Lower, Dental Advisory Given   Pulmonary shortness of breath and with exertion, pneumonia -, unresolved, former smoker,  New lung infiltrates on CXR.  Possible pneumonia or CHF. Possible impending respiratory failure.   + decreased breath sounds      Cardiovascular Exercise Tolerance: Poor hypertension, Pt. on home beta blockers and Pt. on medications + Past MI and +CHF Normal cardiovascular examRhythm:regular Rate:Normal  Systolic and diastolic CHF class 3 EF 35 - 40% Prolonged QT. Acute NSTEMI with elevated troponins but no CP.   Neuro/Psych negative neurological ROS  negative psych ROS   GI/Hepatic negative GI ROS, Neg liver ROS,   Endo/Other  negative endocrine ROS  Renal/GU negative Renal ROS  negative genitourinary   Musculoskeletal   Abdominal   Peds  Hematology negative hematology ROS (+) anemia , hgb 10.9   Anesthesia Other Findings   Reproductive/Obstetrics negative OB ROS                         Anesthesia Physical Anesthesia Plan  ASA: IV  Anesthesia Plan: Spinal   Post-op Pain Management:    Induction:   Airway Management Planned: Simple Face Mask  Additional Equipment: Arterial line  Intra-op Plan:   Post-operative Plan: Possible Post-op intubation/ventilation  Informed Consent: I have reviewed the patients History and Physical, chart, labs and discussed the procedure including the risks, benefits and alternatives for the proposed anesthesia with the patient or authorized representative who has indicated his/her understanding and  acceptance.   Dental Advisory Given  Plan Discussed with: CRNA and Surgeon  Anesthesia Plan Comments: (This patient has an extremely high 30 day mortality with acutely elevated troponin levels and new infiltrates on her  CXR.  Dr. Magnus IvanBlackman has assured me that the family knows how dire the situation is.  The decision has been made to proceed with surgery in spite of the extremely high risk.  Avanni Turnbaugh MD)     Anesthesia Quick Evaluation

## 2014-09-06 ENCOUNTER — Inpatient Hospital Stay (HOSPITAL_COMMUNITY): Payer: Commercial Managed Care - HMO

## 2014-09-06 ENCOUNTER — Encounter (HOSPITAL_COMMUNITY)
Admission: EM | Disposition: A | Discharge status: Skilled Nursing Facility | Payer: Self-pay | Source: Home / Self Care | Attending: Internal Medicine

## 2014-09-06 ENCOUNTER — Inpatient Hospital Stay (HOSPITAL_COMMUNITY): Payer: Commercial Managed Care - HMO | Admitting: Anesthesiology

## 2014-09-06 ENCOUNTER — Encounter (HOSPITAL_COMMUNITY): Payer: Self-pay | Admitting: Registered Nurse

## 2014-09-06 HISTORY — PX: TOTAL HIP ARTHROPLASTY: SHX124

## 2014-09-06 LAB — CBC
HCT: 35.7 % — ABNORMAL LOW (ref 36.0–46.0)
HEMOGLOBIN: 11.3 g/dL — AB (ref 12.0–15.0)
MCH: 32.9 pg (ref 26.0–34.0)
MCHC: 31.7 g/dL (ref 30.0–36.0)
MCV: 104.1 fL — AB (ref 78.0–100.0)
PLATELETS: 295 10*3/uL (ref 150–400)
RBC: 3.43 MIL/uL — ABNORMAL LOW (ref 3.87–5.11)
RDW: 14.2 % (ref 11.5–15.5)
WBC: 5.3 10*3/uL (ref 4.0–10.5)

## 2014-09-06 LAB — SURGICAL PCR SCREEN
MRSA, PCR: NEGATIVE
Staphylococcus aureus: NEGATIVE

## 2014-09-06 LAB — BASIC METABOLIC PANEL
Anion gap: 9 (ref 5–15)
BUN: 30 mg/dL — ABNORMAL HIGH (ref 6–20)
CALCIUM: 8.7 mg/dL — AB (ref 8.9–10.3)
CHLORIDE: 99 mmol/L — AB (ref 101–111)
CO2: 31 mmol/L (ref 22–32)
Creatinine, Ser: 1.05 mg/dL — ABNORMAL HIGH (ref 0.44–1.00)
GFR calc Af Amer: 53 mL/min — ABNORMAL LOW (ref >60)
GFR calc non Af Amer: 46 mL/min — ABNORMAL LOW (ref >60)
GLUCOSE: 105 mg/dL — AB (ref 65–99)
Potassium: 3.5 mmol/L (ref 3.5–5.1)
Sodium: 139 mmol/L (ref 135–145)

## 2014-09-06 LAB — GLUCOSE, CAPILLARY
GLUCOSE-CAPILLARY: 101 mg/dL — AB (ref 65–99)
GLUCOSE-CAPILLARY: 124 mg/dL — AB (ref 65–99)
Glucose-Capillary: 147 mg/dL — ABNORMAL HIGH (ref 65–99)
Glucose-Capillary: 133 mg/dL — ABNORMAL HIGH (ref 65–99)
Glucose-Capillary: 107 mg/dL — ABNORMAL HIGH (ref 65–99)
Glucose-Capillary: 115 mg/dL — ABNORMAL HIGH (ref 65–99)

## 2014-09-06 SURGERY — ARTHROPLASTY, HIP, TOTAL, ANTERIOR APPROACH
Anesthesia: Spinal | Site: Hip | Laterality: Right

## 2014-09-06 MED ORDER — DEXAMETHASONE SODIUM PHOSPHATE 10 MG/ML IJ SOLN
INTRAMUSCULAR | Status: AC
  Filled 2014-09-06: qty 1

## 2014-09-06 MED ORDER — METOCLOPRAMIDE HCL 5 MG/ML IJ SOLN
5.0000 mg | Freq: Three times a day (TID) | INTRAMUSCULAR | Status: DC | PRN
Start: 2014-09-06 — End: 2014-09-09

## 2014-09-06 MED ORDER — FENTANYL CITRATE (PF) 100 MCG/2ML IJ SOLN: 25.0000 ug | INTRAMUSCULAR | Status: DC | PRN

## 2014-09-06 MED ORDER — METHOCARBAMOL 500 MG PO TABS
500.0000 mg | ORAL_TABLET | Freq: Four times a day (QID) | ORAL | Status: DC | PRN
  Administered 2014-09-06 – 2014-09-07 (×2): 500 mg via ORAL
  Filled 2014-09-06 (×2): qty 1

## 2014-09-06 MED ORDER — PROPOFOL 10 MG/ML IV BOLUS
INTRAVENOUS | Status: AC
  Filled 2014-09-06: qty 20

## 2014-09-06 MED ORDER — FENTANYL CITRATE (PF) 100 MCG/2ML IJ SOLN
INTRAMUSCULAR | Status: AC
  Filled 2014-09-06: qty 2

## 2014-09-06 MED ORDER — DEXTROSE 5 % IV SOLN
10.0000 mg | INTRAVENOUS | Status: DC | PRN
  Administered 2014-09-06: 10 ug/min via INTRAVENOUS

## 2014-09-06 MED ORDER — ONDANSETRON HCL 4 MG PO TABS: 4.0000 mg | ORAL_TABLET | Freq: Four times a day (QID) | ORAL | Status: DC | PRN

## 2014-09-06 MED ORDER — PHENOL 1.4 % MT LIQD
1.0000 | OROMUCOSAL | Status: DC | PRN
  Filled 2014-09-06: qty 177

## 2014-09-06 MED ORDER — METHOCARBAMOL 1000 MG/10ML IJ SOLN
500.0000 mg | Freq: Four times a day (QID) | INTRAMUSCULAR | Status: DC | PRN
  Filled 2014-09-06: qty 5

## 2014-09-06 MED ORDER — BUPIVACAINE HCL (PF) 0.75 % IJ SOLN
INTRAMUSCULAR | Status: DC | PRN
  Administered 2014-09-06: 12 mg via INTRATHECAL

## 2014-09-06 MED ORDER — LACTATED RINGERS IV SOLN
INTRAVENOUS | Status: DC | PRN
  Administered 2014-09-06: 09:00:00 via INTRAVENOUS

## 2014-09-06 MED ORDER — STERILE WATER FOR IRRIGATION IR SOLN
Status: DC | PRN
  Administered 2014-09-06: 1500 mL

## 2014-09-06 MED ORDER — MIDAZOLAM HCL 5 MG/5ML IJ SOLN
INTRAMUSCULAR | Status: DC | PRN
  Administered 2014-09-06: 1 mg via INTRAVENOUS

## 2014-09-06 MED ORDER — MIDAZOLAM HCL 2 MG/2ML IJ SOLN
INTRAMUSCULAR | Status: AC
  Filled 2014-09-06: qty 2

## 2014-09-06 MED ORDER — ALBUMIN HUMAN 5 % IV SOLN
INTRAVENOUS | Status: AC
  Filled 2014-09-06: qty 250

## 2014-09-06 MED ORDER — KETAMINE HCL 10 MG/ML IJ SOLN
INTRAMUSCULAR | Status: AC
  Filled 2014-09-06: qty 1

## 2014-09-06 MED ORDER — MENTHOL 3 MG MT LOZG: 1.0000 | LOZENGE | OROMUCOSAL | Status: DC | PRN

## 2014-09-06 MED ORDER — HYDROCODONE-ACETAMINOPHEN 5-325 MG PO TABS: 1.0000 | ORAL_TABLET | Freq: Four times a day (QID) | ORAL | Status: DC | PRN

## 2014-09-06 MED ORDER — FENTANYL CITRATE (PF) 100 MCG/2ML IJ SOLN
INTRAMUSCULAR | Status: DC | PRN
  Administered 2014-09-06: 50 ug via INTRAVENOUS

## 2014-09-06 MED ORDER — SODIUM CHLORIDE 0.9 % IR SOLN
Status: DC | PRN
  Administered 2014-09-06: 1000 mL

## 2014-09-06 MED ORDER — MORPHINE SULFATE 2 MG/ML IJ SOLN: 0.5000 mg | INTRAMUSCULAR | Status: DC | PRN

## 2014-09-06 MED ORDER — PROPOFOL INFUSION 10 MG/ML OPTIME
INTRAVENOUS | Status: DC | PRN
  Administered 2014-09-06: 50 ug/kg/min via INTRAVENOUS

## 2014-09-06 MED ORDER — 0.9 % SODIUM CHLORIDE (POUR BTL) OPTIME
TOPICAL | Status: DC | PRN
  Administered 2014-09-06: 1000 mL

## 2014-09-06 MED ORDER — SODIUM CHLORIDE 0.9 % IV SOLN
INTRAVENOUS | Status: DC | PRN
  Administered 2014-09-06: 07:00:00 via INTRAVENOUS

## 2014-09-06 MED ORDER — METOCLOPRAMIDE HCL 5 MG PO TABS
5.0000 mg | ORAL_TABLET | Freq: Three times a day (TID) | ORAL | Status: DC | PRN
Start: 2014-09-06 — End: 2014-09-09
  Filled 2014-09-06: qty 2

## 2014-09-06 MED ORDER — ACETAMINOPHEN 325 MG PO TABS
650.0000 mg | ORAL_TABLET | Freq: Four times a day (QID) | ORAL | Status: DC | PRN
  Administered 2014-09-09: 650 mg via ORAL
  Filled 2014-09-06: qty 2

## 2014-09-06 MED ORDER — LACTATED RINGERS IV SOLN: INTRAVENOUS | Status: DC | PRN

## 2014-09-06 MED ORDER — SODIUM CHLORIDE 0.9 % IV SOLN
INTRAVENOUS | Status: DC
Start: 2014-09-06 — End: 2014-09-09
  Administered 2014-09-06 – 2014-09-08 (×2): via INTRAVENOUS

## 2014-09-06 MED ORDER — KETAMINE HCL 10 MG/ML IJ SOLN
INTRAMUSCULAR | Status: DC | PRN
  Administered 2014-09-06 (×3): 10 mg via INTRAVENOUS
  Administered 2014-09-06 (×2): 20 mg via INTRAVENOUS

## 2014-09-06 MED ORDER — ALBUMIN HUMAN 5 % IV SOLN
INTRAVENOUS | Status: DC | PRN
  Administered 2014-09-06: 09:00:00 via INTRAVENOUS

## 2014-09-06 MED ORDER — ACETAMINOPHEN 650 MG RE SUPP: 650.0000 mg | Freq: Four times a day (QID) | RECTAL | Status: DC | PRN

## 2014-09-06 MED ORDER — ASPIRIN EC 325 MG PO TBEC
325.0000 mg | DELAYED_RELEASE_TABLET | Freq: Every day | ORAL | Status: DC
Start: 2014-09-07 — End: 2014-09-09
  Administered 2014-09-07 – 2014-09-09 (×3): 325 mg via ORAL
  Filled 2014-09-06 (×3): qty 1

## 2014-09-06 MED ORDER — ONDANSETRON HCL 4 MG/2ML IJ SOLN
4.0000 mg | Freq: Four times a day (QID) | INTRAMUSCULAR | Status: DC | PRN
  Administered 2014-09-08: 4 mg via INTRAVENOUS
  Filled 2014-09-06: qty 2

## 2014-09-06 SURGICAL SUPPLY — 45 items
APL SKNCLS STERI-STRIP NONHPOA (GAUZE/BANDAGES/DRESSINGS)
BAG SPEC THK2 15X12 ZIP CLS (MISCELLANEOUS) ×1
BAG ZIPLOCK 12X15 (MISCELLANEOUS) ×2 IMPLANT
BENZOIN TINCTURE PRP APPL 2/3 (GAUZE/BANDAGES/DRESSINGS) IMPLANT
BLADE SAW SGTL 18X1.27X75 (BLADE) ×2 IMPLANT
BLADE SAW SGTL 18X1.27X75MM (BLADE) ×1
CAPT HIP HEMI 2 ×2 IMPLANT
CELLS DAT CNTRL 66122 CELL SVR (MISCELLANEOUS) ×1 IMPLANT
CLOSURE WOUND 1/2 X4 (GAUZE/BANDAGES/DRESSINGS)
COVER PERINEAL POST (MISCELLANEOUS) ×3 IMPLANT
DRAPE C-ARM 42X120 X-RAY (DRAPES) ×3 IMPLANT
DRAPE STERI IOBAN 125X83 (DRAPES) ×3 IMPLANT
DRAPE U-SHAPE 47X51 STRL (DRAPES) ×9 IMPLANT
DRSG AQUACEL AG ADV 3.5X10 (GAUZE/BANDAGES/DRESSINGS) ×3 IMPLANT
DURAPREP 26ML APPLICATOR (WOUND CARE) ×3 IMPLANT
ELECT BLADE TIP CTD 4 INCH (ELECTRODE) ×3 IMPLANT
ELECT REM PT RETURN 9FT ADLT (ELECTROSURGICAL) ×3
ELECTRODE REM PT RTRN 9FT ADLT (ELECTROSURGICAL) ×1 IMPLANT
FACESHIELD WRAPAROUND (MASK) ×12 IMPLANT
FACESHIELD WRAPAROUND OR TEAM (MASK) ×4 IMPLANT
GAUZE XEROFORM 1X8 LF (GAUZE/BANDAGES/DRESSINGS) ×2 IMPLANT
GLOVE BIO SURGEON STRL SZ7.5 (GLOVE) ×3 IMPLANT
GLOVE BIOGEL PI IND STRL 8 (GLOVE) ×2 IMPLANT
GLOVE BIOGEL PI INDICATOR 8 (GLOVE) ×4
GLOVE ECLIPSE 8.0 STRL XLNG CF (GLOVE) ×3 IMPLANT
GOWN STRL REUS W/TWL XL LVL3 (GOWN DISPOSABLE) ×6 IMPLANT
HANDPIECE INTERPULSE COAX TIP (DISPOSABLE) ×3
KIT BASIN OR (CUSTOM PROCEDURE TRAY) ×3 IMPLANT
PACK TOTAL JOINT (CUSTOM PROCEDURE TRAY) ×3 IMPLANT
PEN SKIN MARKING BROAD (MISCELLANEOUS) ×3 IMPLANT
RETRACTOR WND ALEXIS 18 MED (MISCELLANEOUS) ×1 IMPLANT
RTRCTR WOUND ALEXIS 18CM MED (MISCELLANEOUS) ×3
SET HNDPC FAN SPRY TIP SCT (DISPOSABLE) ×1 IMPLANT
STAPLER VISISTAT 35W (STAPLE) ×2 IMPLANT
STRIP CLOSURE SKIN 1/2X4 (GAUZE/BANDAGES/DRESSINGS) IMPLANT
SUT ETHIBOND NAB CT1 #1 30IN (SUTURE) ×3 IMPLANT
SUT MNCRL AB 4-0 PS2 18 (SUTURE) ×2 IMPLANT
SUT VIC AB 0 CT1 36 (SUTURE) ×3 IMPLANT
SUT VIC AB 1 CT1 36 (SUTURE) ×3 IMPLANT
SUT VIC AB 2-0 CT1 27 (SUTURE) ×6
SUT VIC AB 2-0 CT1 TAPERPNT 27 (SUTURE) ×2 IMPLANT
TOWEL OR 17X26 10 PK STRL BLUE (TOWEL DISPOSABLE) ×3 IMPLANT
TOWEL OR NON WOVEN STRL DISP B (DISPOSABLE) ×3 IMPLANT
TRAY FOLEY W/METER SILVER 14FR (SET/KITS/TRAYS/PACK) ×3 IMPLANT
YANKAUER SUCT BULB TIP 10FT TU (MISCELLANEOUS) ×3 IMPLANT

## 2014-09-06 NOTE — Anesthesia Postprocedure Evaluation (Signed)
  Anesthesia Post-op Note  Patient: Traci CopperOphelia G Woolsey  Procedure(s) Performed: Procedure(s) (LRB): HEMI HIP ARTHROPLASTY ANTERIOR APPROACH (Right)  Patient Location: PACU  Anesthesia Type: Spinal  Level of Consciousness: awake and alert   Airway and Oxygen Therapy: Patient Spontanous Breathing  Post-op Pain: mild  Post-op Assessment: Post-op Vital signs reviewed, Patient's Cardiovascular Status Stable, Respiratory Function Stable, Patent Airway and No signs of Nausea or vomiting  Last Vitals:  Filed Vitals:   09/06/14 1300  BP:   Pulse: 94  Temp:   Resp: 15    Post-op Vital Signs: stable   Complications: No apparent anesthesia complications

## 2014-09-06 NOTE — Transfer of Care (Deleted)
Immediate Anesthesia Transfer of Care Note  Patient: Traci Hale  Procedure(s) Performed: Procedure(s): HEMI HIP ARTHROPLASTY ANTERIOR APPROACH (Right)  Patient Location: PACU  Anesthesia Type:Spinal  Level of Consciousness: awake, alert , oriented and patient cooperative  Airway & Oxygen Therapy: Patient Spontanous Breathing and Patient connected to face mask oxygen  Post-op Assessment: Report given to RN and Post -op Vital signs reviewed and stable  Post vital signs: stable  Last Vitals:  Filed Vitals:   09/06/14 0652  BP: 140/38  Pulse: 85  Temp: 36.8 C  Resp: 20    Complications: No apparent anesthesia complications  Spinal level T 10

## 2014-09-06 NOTE — Progress Notes (Signed)
Pt gone to the OR Cardiology is available as needed today and will see again tomorrow.  Please call with questions.  Hillis RangeJames Tae Vonada MD, Kindred Hospital SeattleFACC 09/06/2014 8:05 AM

## 2014-09-06 NOTE — Progress Notes (Signed)
[facility]                                                                              Patient Demographics  Traci Hale, is a 81 y.o. female, DOB - 09-03-1924, ZOX:[id number]  Admit date - 09/02/2014   Admitting Physician Traci Clos, MD  Outpatient Primary MD for the patient is FULP, CAMMIE, MD  LOS - 4   Chief Complaint  Patient presents with  . Fall  . Hip Pain       Brief HPI   Traci Hale is a 81 y.o. female with history of chronic systolic heart failure last year measured was 35-40%, hypertension, chronic anemia, dyslipidemia was brought to the ER after patient had a fall at the backyard of her house. Patient states she still been fairly not hit her head or lose consciousness. In the ER x-rays revealed right hip fracture and Dr. Magnus Hale on call orthopedic surgeon was consulted. Patient's chest x-ray showed infiltrates versus edema and patient was requiring at least 3 L of O2 in ED.  Patient reported some shortness of breath over the last few days. She denied any chest pain, fevers or chills. Patient's troponinwas mildly elevated and Dr. Tresa Hale on-call cardiologist was consulted. Patient was admitted for further management. Patient denied any headache visual symptoms nausea vomiting abdominal pain and diarrhea   Assessment & Plan    Principal Problem:   Femoral neck fracture/right hip fracture after mechanical fall - Appreciate cardiology and orthopedics recommendations - Seen postop today, underwent right hip hemiarthroplasty  Active Problems: NSTEMI: - Cardiology consulted, patient has significant increase in the troponins, last one 6.09, currently denying any chest pain - Cardiology following, not a candidate for invasive ischemic evaluation, continue to diurese. - 2-D echo EF of 35-40% with diffuse hypokinesis, grade 2 diastolic dysfunction - Currently on Lasix, nifedipine, Coreg, statin.   Acute hypoxic respiratory failure with  acute on chronic combined systolic and diastolic CHF: Currently O2 sats 100% on 4 L - 2-D echo EF of 35-40% with diffuse hypokinesis, grade 2 diastolic dysfunction, no significant change in the EF from the prior echo -  Continue Lasix, negative balance of 4 L - Continue strict I's and O's and daily weights - Chest x-ray with possible infiltrates versus edema, patient was placed on IV antibiotics  Uncontrolled hypertension - Continue Lasix, lisinopril, Coreg, nifedipine, hydralazine  Chronic anemia - Closely monitor, keep hemoglobin > 10 due to NSTEMI, follow CBC daily  Code Status: dnr   Family Communication: Discussed in detail with the patient, all imaging results, lab results explained to the patient and daughters at the bedside   Disposition Plan:   Time Spent in minutes  25 minutes  Procedures  2-D echocardiogram Chest x-ray  Consults   Cardiology  Orthopedics   DVT Prophylaxis  SCD's  Medications  Scheduled Meds: . [START ON 09/07/2014] aspirin EC  325 mg Oral Q breakfast  . carvedilol  3.125 mg Oral BID WC  . cefTAZidime (FORTAZ)  IV  2 g Intravenous Q12H  . cholecalciferol  5,000 Units Oral Daily  . folic acid-pyridoxine-cyancobalamin  1 tablet Oral  Daily  . furosemide  40 mg Oral BID  . lisinopril  2.5 mg Oral Daily  . NIFEdipine  60 mg Oral Daily  . omega-3 acid ethyl esters  1 capsule Oral Daily  . simvastatin  20 mg Oral Daily  . vancomycin  500 mg Intravenous Q12H   Continuous Infusions: . sodium chloride 10 mL/hr at 09/06/14 1141   PRN Meds:.acetaminophen **OR** acetaminophen, hydrALAZINE, HYDROcodone-acetaminophen, menthol-cetylpyridinium **OR** phenol, methocarbamol **OR** methocarbamol (ROBAXIN)  IV, metoCLOPramide **OR** metoCLOPramide (REGLAN) injection, morphine injection, ondansetron **OR** ondansetron (ZOFRAN) IV   Antibiotics   Anti-infectives    Start     Dose/Rate Route Frequency Ordered Stop   09/03/14 0600  cefTAZidime (FORTAZ) 2 g in  dextrose 5 % 50 mL IVPB     2 g 100 mL/hr over 30 Minutes Intravenous Every 12 hours 09/03/14 0134     09/03/14 0200  vancomycin (VANCOCIN) 500 mg in sodium chloride 0.9 % 100 mL IVPB     500 mg 100 mL/hr over 60 Minutes Intravenous Every 12 hours 09/03/14 0126     09/02/14 2215  cefTRIAXone (ROCEPHIN) 1 g in dextrose 5 % 50 mL IVPB     1 g 100 mL/hr over 30 Minutes Intravenous  Once 09/02/14 2201 09/03/14 1224   09/02/14 2215  azithromycin (ZITHROMAX) 500 mg in dextrose 5 % 250 mL IVPB     500 mg 250 mL/hr over 60 Minutes Intravenous  Once 09/02/14 2201 09/03/14 1224        Subjective:   Traci Hale was seen and examined today. Patient seen in stepdown after the surgery, alert and awake, denies any specific complaints. 2 daughters at the bedside. Patient denies dizziness,  abdominal pain, N/V/D/C, new weakness, numbess, tingling.   Objective:   Blood pressure 164/70, pulse 89, temperature 98.3 F (36.8 C), temperature source Oral, resp. rate 15, height 5\' 5"  (1.651 m), weight 54.4 kg (119 lb 14.9 oz), SpO2 100 %.  Wt Readings from Last 3 Encounters:  09/06/14 54.4 kg (119 lb 14.9 oz)  06/12/14 58.6 kg (129 lb 3 oz)  01/10/14 49.42 kg (108 lb 15.2 oz)     Intake/Output Summary (Last 24 hours) at 09/06/14 1316 Last data filed at 09/06/14 1200  Gross per 24 hour  Intake   2130 ml  Output   2700 ml  Net   -570 ml    Exam  General: Alert and oriented , NAD, pleasant and cooperative  HEENT:  PERRLA, EOMI, Anicteic Sclera  Neck: Supple, no JVD, no masses  CVS: S1 S2 clear  Respiratory: CTAB  Abdomen: Soft, NT, ND, NBS  Ext: no cyanosis clubbing or edema  Neuro: no new deficits  Skin: No rashes  Psych: Normal affect and demeanor   Data Review   Micro Results Recent Results (from the past 240 hour(s))  Surgical pcr screen     Status: None   Collection Time: 09/06/14 12:34 AM  Result Value Ref Range Status   MRSA, PCR NEGATIVE NEGATIVE Final    Staphylococcus aureus NEGATIVE NEGATIVE Final    Comment:        The Xpert SA Assay (FDA approved for NASAL specimens in patients over 98 years of age), is one component of a comprehensive surveillance program.  Test performance has been validated by [facility] for patients greater than or equal to 79 year old. It is not intended to diagnose infection nor to guide or monitor treatment.     Radiology Reports Dg Chest 2  View  09/02/2014   CLINICAL DATA:  Fall today.  Hip fx. Htn. Chf  EXAM: CHEST - 2 VIEW  COMPARISON:  07/22/2014  FINDINGS: New interstitial and airspace opacities involving upper lung zones more than bases, right greater than left. Mild cardiomegaly stable. Tortuous atheromatous aorta. No pneumothorax. No effusion. Visualized skeletal structures are unremarkable.  IMPRESSION: 1. New asymmetric infiltrates or edema as above. 2. Stable mild cardiomegaly   Electronically Signed   By: Corlis Leak M.D.   On: 09/02/2014 19:50   Pelvis Portable  09/06/2014   CLINICAL DATA:  Right hip arthroplasty.  EXAM: PORTABLE PELVIS 1-2 VIEWS  COMPARISON:  06/30/2014, FINDINGS: Right hip total arthroplasty for repair of femoral neck fracture. No fracture dislocation.  IMPRESSION: Right total hip arthroplasty without complication.   Electronically Signed   By: Genevive Bi M.D.   On: 09/06/2014 11:12   Dg Chest Portable 1 View  09/06/2014   CLINICAL DATA:  Preop for hip surgery. Hip fracture. Recent pneumonia.  EXAM: PORTABLE CHEST - 1 VIEW  COMPARISON:  One-view chest x-ray 09/02/2014. Two-view chest x-ray 07/22/2014  FINDINGS: The heart size is normal. Atherosclerotic calcifications are present at the aortic arch. Aeration in the upper lobes has improved. A diffuse interstitial pattern is increased from the baseline. Subtle asymmetric left-sided airspace densities are present. No focal airspace consolidation is evident.  IMPRESSION: 1. Improved aeration of both lung apices. 2.  Atherosclerosis. 3. A diffuse interstitial pattern is slightly increased, suggesting edema superimposed on emphysema. Subtle left-sided airspace disease raises possibility of pneumonia.   Electronically Signed   By: Marin Roberts M.D.   On: 09/06/2014 07:41   Dg Hip Operative Unilat With Pelvis Right  09/06/2014   CLINICAL DATA:  Hip surgery  EXAM: OPERATIVE RIGHT HIP (WITH PELVIS IF PERFORMED) 4 VIEWS  TECHNIQUE: Fluoroscopic spot image(s) were submitted for interpretation post-operatively.  FLUOROSCOPY TIME:  Radiation Exposure Index (as provided by the fluoroscopic device): Not provided  If the device does not provide the exposure index:  Fluoroscopy Time:  0 minutes 9 seconds  Number of Acquired Images:  4  COMPARISON:  09/02/2014  FINDINGS: Orthopedic hardware proximal LEFT femur post ORIF.  New RIGHT hip replacement with prosthetic components in expected positions.  No fracture or dislocation.  Diffuse osseous demineralization.  Visualized pelvis intact.  IMPRESSION: Post RIGHT hip replacement for RIGHT femoral neck fracture.  No acute complication.  Osseous demineralization.   Electronically Signed   By: Ulyses Southward M.D.   On: 09/06/2014 10:50   Dg Hip Unilat  With Pelvis 2-3 Views Right  09/02/2014   CLINICAL DATA:  Pain post fall at home today  EXAM: RIGHT HIP (WITH PELVIS) 2-3 VIEWS  COMPARISON:  06/10/2014  FINDINGS: Midcervical fracture of the right femur, impacted with some displacement and foreshortening. No dislocation. IM rod and sliding pin fixation hardware across the left femoral neck as before. Bony pelvis intact. Degenerative disc disease in the visualized lower lumbar spine.  IMPRESSION: 1. Displaced midcervical right femur fracture fracture.   Electronically Signed   By: Corlis Leak M.D.   On: 09/02/2014 17:30   Dg Femur, Min 2 Views Right  09/02/2014   CLINICAL DATA:  Proximal RIGHT femur fracture.  EXAM: RIGHT FEMUR 2 VIEWS  COMPARISON:  None.  FINDINGS: Distal femur is intact.  Tricompartmental osteoarthritis of the knee is incidentally noted.  IMPRESSION: Intact distal femur.   Electronically Signed   By: Charolette Child.D.  On: 09/02/2014 20:17    CBC  Recent Labs Lab 09/02/14 1835 09/03/14 0156 09/05/14 0510 09/06/14 0445  WBC 5.6 5.5 6.1 5.3  HGB 11.0* 11.8* 10.9* 11.3*  HCT 34.9* 36.6 34.1* 35.7*  PLT 369 386 274 295  MCV 105.1* 105.2* 102.7* 104.1*  MCH 33.1 33.9 32.8 32.9  MCHC 31.5 32.2 32.0 31.7  RDW 14.6 14.7 14.3 14.2  LYMPHSABS 0.5* 0.8  --   --   MONOABS 0.2 0.4  --   --   EOSABS 0.0 0.0  --   --   BASOSABS 0.0 0.0  --   --     Chemistries   Recent Labs Lab 09/02/14 1835 09/03/14 0156 09/04/14 0427 09/05/14 0510 09/06/14 0445  NA 143 141 139 137 139  K 3.6 4.1 3.2* 3.6 3.5  CL 112* 109 103 98* 99*  CO2 25 24 28 29 31   GLUCOSE 123* 154* 106* 90 105*  BUN 18 19 26* 29* 30*  CREATININE 0.82 0.84 0.87 0.92 1.05*  CALCIUM 8.9 8.9 8.5* 8.8* 8.7*  AST  --  33  --   --   --   ALT  --  12*  --   --   --   ALKPHOS  --  62  --   --   --   BILITOT  --  0.4  --   --   --    ------------------------------------------------------------------------------------------------------------------ estimated creatinine clearance is 31.8 mL/min (by C-G formula based on Cr of 1.05). ------------------------------------------------------------------------------------------------------------------ No results for input(s): HGBA1C in the last 72 hours. ------------------------------------------------------------------------------------------------------------------ No results for input(s): CHOL, HDL, LDLCALC, TRIG, CHOLHDL, LDLDIRECT in the last 72 hours. ------------------------------------------------------------------------------------------------------------------ No results for input(s): TSH, T4TOTAL, T3FREE, THYROIDAB in the last 72 hours.  Invalid input(s):  FREET3 ------------------------------------------------------------------------------------------------------------------ No results for input(s): VITAMINB12, FOLATE, FERRITIN, TIBC, IRON, RETICCTPCT in the last 72 hours.  Coagulation profile  Recent Labs Lab 09/03/14 0156  INR 1.22    No results for input(s): DDIMER in the last 72 hours.  Cardiac Enzymes  Recent Labs Lab 09/03/14 0156 09/03/14 0650 09/03/14 1233  TROPONINI 2.14* 5.87* 6.09*   ------------------------------------------------------------------------------------------------------------------ Invalid input(s): POCBNP   Recent Labs  09/04/14 2333 09/05/14 0554 09/05/14 1217 09/06/14 0004 09/06/14 0632 09/06/14 1148  GLUCAP 107* 88 119* 124* 101* 107*     Amar Keenum M.D. [facility] 09/06/2014, 1:16 PM  Pager: [phone]   Between 7am to 7pm - call Pager - [phone]  After 7pm go to [email or web address] - password [email or web address]  Call night coverage person covering after 7pm

## 2014-09-06 NOTE — Transfer of Care (Signed)
Immediate Anesthesia Transfer of Care Note  Patient: Traci Hale  Procedure(s) Performed: Procedure(s): HEMI HIP ARTHROPLASTY ANTERIOR APPROACH (Right)  Patient Location: PACU  Anesthesia Type:Spinal  Level of Consciousness: awake, alert , oriented and patient cooperative  Airway & Oxygen Therapy: Patient Spontanous Breathing and Patient connected to face mask oxygen  Post-op Assessment: Report given to RN and Post -op Vital signs reviewed and stable  Post vital signs: stable  Last Vitals:  Filed Vitals:   09/06/14 0945  BP: 126/56  Pulse: 81  Temp:   Resp: 13    Complications: No apparent anesthesia complications  T 10 spinal  level

## 2014-09-06 NOTE — Brief Op Note (Signed)
09/02/2014 - 09/06/2014  9:36 AM  PATIENT:  Traci Hale  81 y.o. female  PRE-OPERATIVE DIAGNOSIS:  right hip fracture  POST-OPERATIVE DIAGNOSIS:  right hip fracture  PROCEDURE:  Procedure(s): HEMI HIP ARTHROPLASTY ANTERIOR APPROACH (Right)  SURGEON:  Surgeon(s) and Role:    * Kathryne Hitchhristopher Y Blackman, MD - Primary  ANESTHESIA:   spinal  EBL:  Total I/O In: 1000 [I.V.:1000] Out: 300 [Urine:200; Blood:100]  BLOOD ADMINISTERED:none  DRAINS: none   LOCAL MEDICATIONS USED:  NONE  SPECIMEN:  No Specimen  DISPOSITION OF SPECIMEN:  N/A  COUNTS:  YES  TOURNIQUET:  * No tourniquets in log *  DICTATION: .Other Dictation: Dictation Number [id number]  PLAN OF CARE: Admit to inpatient   PATIENT DISPOSITION:  PACU - hemodynamically stable.   Delay start of Pharmacological VTE agent (>24hrs) due to surgical blood loss or risk of bleeding: no

## 2014-09-06 NOTE — Op Note (Signed)
NAMEMarland Kitchen  DONASIA, WIMES NO.:  [id number]  MEDICAL RECORD NO.:  [id number]  LOCATION:  1405                         FACILITY:  [facility]  PHYSICIAN:  Vanita Panda. Magnus Ivan, M.D.DATE OF BIRTH:  Oct 15, 1924  DATE OF PROCEDURE:  09/06/2014 DATE OF DISCHARGE:                              OPERATIVE REPORT   PREOPERATIVE DIAGNOSIS:  Right hip displaced femoral neck fracture.  POSTOPERATIVE DIAGNOSIS:  Right hip displaced femoral neck fracture.  PROCEDURE:  Right hip hemiarthroplasty.  IMPLANTS:  Size 13 Corail femoral component from DePuy with standard offset, size 47+ 1.5 bipolar metal head.  SURGEON:  Vanita Panda. Magnus Ivan M.D.  ANESTHESIA:  Spinal.  ANTIBIOTICS:  Vancomycin.  BLOOD LOSS:  Less than 100 mL.  COMPLICATIONS:  None.  INDICATIONS:  Ms. Toves is an 81 year old community ambulator, who lives at home with her granddaughter.  A few days ago, she fell at home sustaining what was a nonsyncopal mechanical fall and she was seen in [facility] Emergency Room found have sustained a displaced femoral neck fracture of her right hip.  She also had a significant elevation in her troponins and was deemed high risk for surgery.  However, Cardiology was not able to do anything else to intervention wise and she was started on Lasix at least due to acute on chronic congestive heart failure.  This helped significantly.  She had never had any chest pain, and was on a higher oxygen demand and needs via nasal cannula, but this decreased over the last few days.  I had a long discussion with her and her family about the high-risk nature of surgery and nonoperative approach.  They thought about it for day or so and we talked extensively several hours each day and they said the hemiarthroplasty was in her best interest due to her pain and quality of life.  She is already developing bedsores as well.  The risks and benefits of surgery having been well  understood including the risk of intraoperative complication and death, she did wish to proceed.  DESCRIPTION OF PROCEDURE:  After informed consent was obtained, the appropriate right hip was marked.  She was brought to the operating room while she was on her stretcher.  Spinal anesthesia was obtained.  She was then laid back supine. Traction boots were placed on both of her feet and she was placed supine on the Hana fracture table with the perineal post in place and both legs in inline skeletal traction devices, but no traction applied.  Her right operative hip was prepped and draped with DuraPrep and sterile drapes.  A time-out was called to identify correct patient, correct right hip.  I then made an incision inferior and posterior to the anterosuperior iliac spine and carried this obliquely down the leg.  I dissected down to the tensor fascia lata muscle.  Tensor fascia was then divided longitudinally so I could proceed with a direct anterior approach to the hip.  We identified and cauterized the lateral femoral circumflex vessels and opened up the hip capsule in L-type format finding a large joint effusion and hematoma from her left femoral neck fracture.  We then placed Cobra retractors around the medial and lateral femoral  neck and made our femoral neck cut with an oscillating saw just distal to her fracture, but proximal to the lesser trochanter.  I completed this, an osteotome I placed a corkscrew guide in the femoral head and removed the femoral head in its entirety and measured to be 47 as far it is appropriate size for bipolar head. We then cleaned the acetabulum of any bony debris and went directly to the femur with the leg externally rotated to 100 degrees, extended and adducted.  We were able to place a Mueller retractor medially and Hohmann retractor behind the greater trochanter.  I released the lateral joint capsule and used a box cutting osteotome to enter the  femoral canal and a rongeur to lateralize them and broaching from using the Corail broaching system from a size 8 up to a size 13.  With size 13 in place, we trialed a 47 unipolar 47+ 1.5 bipolar femoral head reduced in the acetabulum.  I was pleased with her offset and leg lengths and stability.  We then dislocated the hip and removed the trial components. I placed the real size 13 Corail femoral component followed by the bipolar 47+ 1.5 femoral head and reduced this in the acetabulum.  Again I was pleased with stability and range of motion.  We then irrigated the hip with normal saline solution using pulsatile lavage.  I closed the joint capsule with interrupted #1 Ethibond suture followed by running #1 Vicryl in the tensor fascia, 2-0 Vicryl in subcutaneous tissue, and interrupted staples on the skin.  Xeroform and an Aquacel dressing was applied.  She was taken off the Hana table and taken to the recovery room in stable condition.  All final counts were correct.  There were no complications noted.  She did well from an operative standpoint.  We will monitor in step-down unit postop.     Vanita Pandahristopher Y. Magnus IvanBlackman, M.D.     CYB/MEDQ  D:  09/06/2014  T:  09/06/2014  Job:  [id number]

## 2014-09-06 NOTE — Anesthesia Procedure Notes (Signed)
Spinal Patient location during procedure: OR Start time: 09/06/2014 8:00 AM End time: 09/06/2014 8:14 AM Staffing Anesthesiologist: Rod Mae Performed by: anesthesiologist  Preanesthetic Checklist Completed: patient identified, site marked, surgical consent, pre-op evaluation, timeout performed, IV checked, risks and benefits discussed and monitors and equipment checked Spinal Block Patient position: right lateral decubitus Prep: Betadine Patient monitoring: heart rate, continuous pulse ox and blood pressure Approach: midline Location: L4-5 Injection technique: single-shot Needle Needle type: Spinocan  Needle gauge: 22 G Needle length: 9 cm Assessment Sensory level: T6 Additional Notes Expiration date of kit checked and confirmed. Patient tolerated procedure well, without complications.

## 2014-09-07 ENCOUNTER — Encounter (HOSPITAL_COMMUNITY): Payer: Self-pay | Admitting: Orthopaedic Surgery

## 2014-09-07 LAB — GLUCOSE, CAPILLARY
GLUCOSE-CAPILLARY: 87 mg/dL (ref 65–99)
Glucose-Capillary: 101 mg/dL — ABNORMAL HIGH (ref 65–99)

## 2014-09-07 LAB — BASIC METABOLIC PANEL
Anion gap: 12 (ref 5–15)
BUN: 23 mg/dL — ABNORMAL HIGH (ref 6–20)
CO2: 29 mmol/L (ref 22–32)
Calcium: 8.8 mg/dL — ABNORMAL LOW (ref 8.9–10.3)
Chloride: 99 mmol/L — ABNORMAL LOW (ref 101–111)
Creatinine, Ser: 0.7 mg/dL (ref 0.44–1.00)
GFR calc non Af Amer: >60 mL/min (ref >60)
GLUCOSE: 92 mg/dL (ref 65–99)
POTASSIUM: 3.6 mmol/L (ref 3.5–5.1)
SODIUM: 140 mmol/L (ref 135–145)

## 2014-09-07 LAB — CBC
HEMATOCRIT: 32 % — AB (ref 36.0–46.0)
Hemoglobin: 10.3 g/dL — ABNORMAL LOW (ref 12.0–15.0)
MCH: 33.3 pg (ref 26.0–34.0)
MCHC: 32.2 g/dL (ref 30.0–36.0)
MCV: 103.6 fL — ABNORMAL HIGH (ref 78.0–100.0)
Platelets: 300 10*3/uL (ref 150–400)
RBC: 3.09 MIL/uL — ABNORMAL LOW (ref 3.87–5.11)
RDW: 14.2 % (ref 11.5–15.5)
WBC: 7.2 10*3/uL (ref 4.0–10.5)

## 2014-09-07 LAB — PROCALCITONIN: Procalcitonin: 0.21 ng/mL

## 2014-09-07 NOTE — Progress Notes (Signed)
[facility]                                                                              Patient Demographics  Traci Hale, is a 81 y.o. female, DOB - March 24, 1925, ZOX:[id number]  Admit date - 09/02/2014   Admitting Physician Eduard Clos, MD  Outpatient Primary MD for the patient is FULP, CAMMIE, MD  LOS - 5   Chief Complaint  Patient presents with  . Fall  . Hip Pain       Brief HPI   Traci Hale is a 81 y.o. female with history of chronic systolic heart failure last year measured was 35-40%, hypertension, chronic anemia, dyslipidemia was brought to the ER after patient had a fall at the backyard of her house. Patient states she still been fairly not hit her head or lose consciousness. In the ER x-rays revealed right hip fracture and Dr. Magnus Ivan on call orthopedic surgeon was consulted. Patient's chest x-ray showed infiltrates versus edema and patient was requiring at least 3 L of O2 in ED.  Patient reported some shortness of breath over the last few days. She denied any chest pain, fevers or chills. Patient's troponinwas mildly elevated and Dr. Tresa Endo on-call cardiologist was consulted. Patient was admitted for further management. Patient denied any headache visual symptoms nausea vomiting abdominal pain and diarrhea   Assessment & Plan    Principal Problem:   Femoral neck fracture/right hip fracture after mechanical fall status post right hip hemiarthroplasty - Postop day #1, currently stable - Appreciate cardiology and orthopedics recommendations -Follow H&H closely, currently stable at 10.3, keep hemoglobin above 10   Active Problems: NSTEMI: - Cardiology consulted, patient has significant increase in the troponins, last one 6.09, currently denying any chest pain - Cardiology following, not a candidate for invasive ischemic evaluation, continue to diurese. - 2-D echo EF of 35-40% with diffuse hypokinesis, grade 2 diastolic dysfunction -  Currently on Lasix, nifedipine, Coreg, statin.   Acute hypoxic respiratory failure with acute on chronic combined systolic and diastolic CHF: Improving, O2 sats 100% on room air - 2-D echo EF of 35-40% with diffuse hypokinesis, grade 2 diastolic dysfunction, no significant change in the EF from the prior echo -  Continue Lasix, negative balance of 5.35 L, transitioned to oral Lasix by cardiology   - Continue strict I's and O's and daily weights - Chest x-ray with possible infiltrates versus edema, patient was placed on IV antibiotics,Pro-calcitonin 0.21 today, no leukocytosis, afebrile, no URI symptoms or hypoxia, Will discontinue IV antibiotics at this time  Uncontrolled hypertension -  currently stable, Continue Lasix, lisinopril, Coreg, nifedipine, hydralazine  Chronic anemia - Closely monitor, keep hemoglobin > 10 due to NSTEMI, follow CBC daily  Code Status: dnr   Family Communication: Discussed in detail with the patient, all imaging results, lab results explained to the patient   Disposition Plan: Will transfer to telemetry floor once cleared by cardiology  Time Spent in minutes  25 minutes  Procedures  2-D echocardiogram Chest x-ray  Consults   Cardiology  Orthopedics   DVT Prophylaxis  SCD's  Medications  Scheduled Meds: . aspirin  EC  325 mg Oral Q breakfast  . carvedilol  3.125 mg Oral BID WC  . cefTAZidime (FORTAZ)  IV  2 g Intravenous Q12H  . cholecalciferol  5,000 Units Oral Daily  . folic acid-pyridoxine-cyancobalamin  1 tablet Oral Daily  . furosemide  40 mg Oral BID  . lisinopril  2.5 mg Oral Daily  . NIFEdipine  60 mg Oral Daily  . omega-3 acid ethyl esters  1 capsule Oral Daily  . simvastatin  20 mg Oral Daily  . vancomycin  500 mg Intravenous Q12H   Continuous Infusions: . sodium chloride 10 mL/hr at 09/06/14 1141   PRN Meds:.acetaminophen **OR** acetaminophen, hydrALAZINE, HYDROcodone-acetaminophen, menthol-cetylpyridinium **OR** phenol,  methocarbamol **OR** methocarbamol (ROBAXIN)  IV, metoCLOPramide **OR** metoCLOPramide (REGLAN) injection, morphine injection, ondansetron **OR** ondansetron (ZOFRAN) IV   Antibiotics   Anti-infectives    Start     Dose/Rate Route Frequency Ordered Stop   09/03/14 0600  cefTAZidime (FORTAZ) 2 g in dextrose 5 % 50 mL IVPB     2 g 100 mL/hr over 30 Minutes Intravenous Every 12 hours 09/03/14 0134     09/03/14 0200  vancomycin (VANCOCIN) 500 mg in sodium chloride 0.9 % 100 mL IVPB     500 mg 100 mL/hr over 60 Minutes Intravenous Every 12 hours 09/03/14 0126     09/02/14 2215  cefTRIAXone (ROCEPHIN) 1 g in dextrose 5 % 50 mL IVPB     1 g 100 mL/hr over 30 Minutes Intravenous  Once 09/02/14 2201 09/03/14 1224   09/02/14 2215  azithromycin (ZITHROMAX) 500 mg in dextrose 5 % 250 mL IVPB     500 mg 250 mL/hr over 60 Minutes Intravenous  Once 09/02/14 2201 09/03/14 1224        Subjective:   Traci Hale was seen and examined today.Doesn't feel too good today but no acute issues, no acute events overnight.   Patient denies dizziness,  abdominal pain, N/V/D/C, new weakness, numbess, tingling.   Objective:   Blood pressure 133/49, pulse 85, temperature 98.7 F (37.1 C), temperature source Oral, resp. rate 21, height  (1.651 m), weight 58.8 kg (129 lb 10.1 oz), SpO2 100 %.  Wt Readings from Last 3 Encounters:  09/07/14 58.8 kg (129 lb 10.1 oz)  06/12/14 58.6 kg (129 lb 3 oz)  01/10/14 49.42 kg (108 lb 15.2 oz)     Intake/Output Summary (Last 24 hours) at 09/07/14 1114 Last data filed at 09/07/14 0900  Gross per 24 hour  Intake    570 ml  Output   1825 ml  Net  -1255 ml    Exam  General: Alert and oriented , NAD  HEENT:  PERRLA, EOMI, Anicteic Sclera  Neck: Supple, no JVD, no masses  CVS: S1 S2 clear  Respiratory: CTAB  Abdomen: Soft, NT, ND, NBS  Ext: no cyanosis clubbing or edema  Neuro: no new deficits  Skin: No rashes  Psych: Normal affect and  demeanor   Data Review   Micro Results Recent Results (from the past 240 hour(s))  Surgical pcr screen     Status: None   Collection Time: 09/06/14 12:34 AM  Result Value Ref Range Status   MRSA, PCR NEGATIVE NEGATIVE Final   Staphylococcus aureus NEGATIVE NEGATIVE Final    Comment:        The Xpert SA Assay (FDA approved for NASAL specimens in patients over 31 years of age), is one component of a comprehensive surveillance program.  Test performance has been validated  by [facility] for patients greater than or equal to 13 year old. It is not intended to diagnose infection nor to guide or monitor treatment.     Radiology Reports Dg Chest 2 View  09/02/2014   CLINICAL DATA:  Fall today.  Hip fx. Htn. Chf  EXAM: CHEST - 2 VIEW  COMPARISON:  07/22/2014  FINDINGS: New interstitial and airspace opacities involving upper lung zones more than bases, right greater than left. Mild cardiomegaly stable. Tortuous atheromatous aorta. No pneumothorax. No effusion. Visualized skeletal structures are unremarkable.  IMPRESSION: 1. New asymmetric infiltrates or edema as above. 2. Stable mild cardiomegaly   Electronically Signed   By: Corlis Leak M.D.   On: 09/02/2014 19:50   Pelvis Portable  09/06/2014   CLINICAL DATA:  Right hip arthroplasty.  EXAM: PORTABLE PELVIS 1-2 VIEWS  COMPARISON:  06/30/2014, FINDINGS: Right hip total arthroplasty for repair of femoral neck fracture. No fracture dislocation.  IMPRESSION: Right total hip arthroplasty without complication.   Electronically Signed   By: Genevive Bi M.D.   On: 09/06/2014 11:12   Dg Chest Portable 1 View  09/06/2014   CLINICAL DATA:  Preop for hip surgery. Hip fracture. Recent pneumonia.  EXAM: PORTABLE CHEST - 1 VIEW  COMPARISON:  One-view chest x-ray 09/02/2014. Two-view chest x-ray 07/22/2014  FINDINGS: The heart size is normal. Atherosclerotic calcifications are present at the aortic arch. Aeration in the upper lobes has improved.  A diffuse interstitial pattern is increased from the baseline. Subtle asymmetric left-sided airspace densities are present. No focal airspace consolidation is evident.  IMPRESSION: 1. Improved aeration of both lung apices. 2. Atherosclerosis. 3. A diffuse interstitial pattern is slightly increased, suggesting edema superimposed on emphysema. Subtle left-sided airspace disease raises possibility of pneumonia.   Electronically Signed   By: Marin Roberts M.D.   On: 09/06/2014 07:41   Dg Hip Operative Unilat With Pelvis Right  09/06/2014   CLINICAL DATA:  Hip surgery  EXAM: OPERATIVE RIGHT HIP (WITH PELVIS IF PERFORMED) 4 VIEWS  TECHNIQUE: Fluoroscopic spot image(s) were submitted for interpretation post-operatively.  FLUOROSCOPY TIME:  Radiation Exposure Index (as provided by the fluoroscopic device): Not provided  If the device does not provide the exposure index:  Fluoroscopy Time:  0 minutes 9 seconds  Number of Acquired Images:  4  COMPARISON:  09/02/2014  FINDINGS: Orthopedic hardware proximal LEFT femur post ORIF.  New RIGHT hip replacement with prosthetic components in expected positions.  No fracture or dislocation.  Diffuse osseous demineralization.  Visualized pelvis intact.  IMPRESSION: Post RIGHT hip replacement for RIGHT femoral neck fracture.  No acute complication.  Osseous demineralization.   Electronically Signed   By: Ulyses Southward M.D.   On: 09/06/2014 10:50   Dg Hip Unilat  With Pelvis 2-3 Views Right  09/02/2014   CLINICAL DATA:  Pain post fall at home today  EXAM: RIGHT HIP (WITH PELVIS) 2-3 VIEWS  COMPARISON:  06/10/2014  FINDINGS: Midcervical fracture of the right femur, impacted with some displacement and foreshortening. No dislocation. IM rod and sliding pin fixation hardware across the left femoral neck as before. Bony pelvis intact. Degenerative disc disease in the visualized lower lumbar spine.  IMPRESSION: 1. Displaced midcervical right femur fracture fracture.   Electronically  Signed   By: Corlis Leak M.D.   On: 09/02/2014 17:30   Dg Femur, Min 2 Views Right  09/02/2014   CLINICAL DATA:  Proximal RIGHT femur fracture.  EXAM: RIGHT FEMUR 2  VIEWS  COMPARISON:  None.  FINDINGS: Distal femur is intact. Tricompartmental osteoarthritis of the knee is incidentally noted.  IMPRESSION: Intact distal femur.   Electronically Signed   By: Andreas NewportGeoffrey  Lamke M.D.   On: 09/02/2014 20:17    CBC  Recent Labs Lab 09/02/14 1835 09/03/14 0156 09/05/14 0510 09/06/14 0445 09/07/14 0705  WBC 5.6 5.5 6.1 5.3 7.2  HGB 11.0* 11.8* 10.9* 11.3* 10.3*  HCT 34.9* 36.6 34.1* 35.7* 32.0*  PLT 369 386 274 295 300  MCV 105.1* 105.2* 102.7* 104.1* 103.6*  MCH 33.1 33.9 32.8 32.9 33.3  MCHC 31.5 32.2 32.0 31.7 32.2  RDW 14.6 14.7 14.3 14.2 14.2  LYMPHSABS 0.5* 0.8  --   --   --   MONOABS 0.2 0.4  --   --   --   EOSABS 0.0 0.0  --   --   --   BASOSABS 0.0 0.0  --   --   --     Chemistries   Recent Labs Lab 09/03/14 0156 09/04/14 0427 09/05/14 0510 09/06/14 0445 09/07/14 0705  NA 141 139 137 139 140  K 4.1 3.2* 3.6 3.5 3.6  CL 109 103 98* 99* 99*  CO2 24 28 29 31 29   GLUCOSE 154* 106* 90 105* 92  BUN 19 26* 29* 30* 23*  CREATININE 0.84 0.87 0.92 1.05* 0.70  CALCIUM 8.9 8.5* 8.8* 8.7* 8.8*  AST 33  --   --   --   --   ALT 12*  --   --   --   --   ALKPHOS 62  --   --   --   --   BILITOT 0.4  --   --   --   --    ------------------------------------------------------------------------------------------------------------------ estimated creatinine clearance is 43.7 mL/min (by C-G formula based on Cr of 0.7). ------------------------------------------------------------------------------------------------------------------ No results for input(s): HGBA1C in the last 72 hours. ------------------------------------------------------------------------------------------------------------------ No results for input(s): CHOL, HDL, LDLCALC, TRIG, CHOLHDL, LDLDIRECT in the last 72  hours. ------------------------------------------------------------------------------------------------------------------ No results for input(s): TSH, T4TOTAL, T3FREE, THYROIDAB in the last 72 hours.  Invalid input(s): FREET3 ------------------------------------------------------------------------------------------------------------------ No results for input(s): VITAMINB12, FOLATE, FERRITIN, TIBC, IRON, RETICCTPCT in the last 72 hours.  Coagulation profile  Recent Labs Lab 09/03/14 0156  INR 1.22    No results for input(s): DDIMER in the last 72 hours.  Cardiac Enzymes  Recent Labs Lab 09/03/14 0156 09/03/14 0650 09/03/14 1233  TROPONINI 2.14* 5.87* 6.09*   ------------------------------------------------------------------------------------------------------------------ Invalid input(s): POCBNP   Recent Labs  09/05/14 1217 09/06/14 0004 09/06/14 0632 09/06/14 1148 09/06/14 1900 09/06/14 2352  GLUCAP 119* 124* 101* 107* 115* 101*     Carolena Fairbank M.D. [facility] 09/07/2014, 11:14 AM  Pager: [phone]   Between 7am to 7pm - call Pager - [phone]  After 7pm go to [email or web address] - password [email or web address]  Call night coverage person covering after 7pm

## 2014-09-07 NOTE — Progress Notes (Signed)
Date:  September 07, 2014 U.R. performed for needs and level of care. Artrial lines in place/ troponins remain elevated. Will continue to follow for Case Management needs.  Marcelle Smilinghonda Jordy Hewins, RN, BSN, [place]   [phone]

## 2014-09-07 NOTE — Evaluation (Signed)
Physical Therapy Evaluation Patient Details Name: Traci CopperOphelia G Bolle MRN: [id number] DOB: [birth year] Today's Date: 09/07/2014   History of Present Illness  Pt admitted s/p fall with R hip fx with hemi-arthroplasty repair via anterior direct approach  Clinical Impression  Pt s/p R hip hemi-arthroplasty presents with functional mobility limitations 2* decreased R LE strength/ROM, post op pain, and generalized weakness/deconditioning.  Pt would benefit from follow up rehab at SNF level to maximize IND and safety.    Follow Up Recommendations SNF    Equipment Recommendations  None recommended by PT    Recommendations for Other Services OT consult     Precautions / Restrictions Precautions Precautions: Fall Restrictions Weight Bearing Restrictions: No Other Position/Activity Restrictions: WBAT      Mobility  Bed Mobility Overal bed mobility: +2 for physical assistance;Needs Assistance Bed Mobility: Supine to Sit;Sit to Supine     Supine to sit: Max assist;+2 for physical assistance Sit to supine: Max assist;+2 for physical assistance   General bed mobility comments: cues for sequence and use of L LE to self assist.  Utilized pad on bed to assist pt supine<>sit  Transfers Overall transfer level: Needs assistance Equipment used: Rolling walker (2 wheeled) Transfers: Sit to/from Stand Sit to Stand: Max assist;+2 physical assistance         General transfer comment: cues for LE management and use of UEs  Ambulation/Gait Ambulation/Gait assistance: +2 physical assistance;+2 safety/equipment Ambulation Distance (Feet): 0 Feet Assistive device: Rolling walker (2 wheeled)       General Gait Details: Pt stood only x 2 with RW and max assist.  Pt min WB on R LE and UEs and unable to initiate step either LE.  Pt returned to sitting with c/o dizziness  Stairs            Wheelchair Mobility    Modified Rankin (Stroke Patients Only)       Balance                                             Pertinent Vitals/Pain Pain Assessment: Faces Faces Pain Scale: Hurts even more Pain Location: R hip Pain Descriptors / Indicators: Burning;Sore Pain Intervention(s): Limited activity within patient's tolerance;Monitored during session;Premedicated before session    Home Living Family/patient expects to be discharged to:: Private residence Living Arrangements: Alone Available Help at Discharge: Family;Other (Comment) Type of Home: House Home Access: Stairs to enter   Enter[employer] CorporationEntrance Stairs-Number of Steps: several Home Layout: One level Home Equipment: Cane - single point;Walker - 2 wheels      Prior Function Level of Independence: Independent with assistive device(s)               Hand Dominance        Extremity/Trunk Assessment   Upper Extremity Assessment: Generalized weakness           Lower Extremity Assessment: Generalized weakness;RLE deficits/detail RLE Deficits / Details: ROM ltd all planes 2* pain and muscle guarding    Cervical / Trunk Assessment: Kyphotic  Communication   Communication: HOH  Cognition Arousal/Alertness: Awake/alert Behavior During Therapy: WFL for tasks assessed/performed Overall Cognitive Status: Within Functional Limits for tasks assessed                      General Comments      Exercises Total Joint Exercises Ankle Circles/Pumps: AAROM;Both;15 reps;Supine  Heel Slides: AAROM;Right;15 reps;Supine Hip ABduction/ADduction: AAROM;Right;10 reps;Supine      Assessment/Plan    PT Assessment Patient needs continued PT services  PT Diagnosis Difficulty walking   PT Problem List Decreased strength;Decreased range of motion;Decreased activity tolerance;Decreased balance;Decreased mobility;Decreased knowledge of use of DME;Pain;Decreased knowledge of precautions;Decreased safety awareness  PT Treatment Interventions DME instruction;Gait training;Functional mobility  training;Therapeutic activities;Therapeutic exercise;Patient/family education   PT Goals (Current goals can be found in the Care Plan section) Acute Rehab PT Goals Patient Stated Goal: Back to bed PT Goal Formulation: With patient Time For Goal Achievement: 09/21/14 Potential to Achieve Goals: Fair    Frequency Min 3X/week   Barriers to discharge        Co-evaluation               End of Session Equipment Utilized During Treatment: Gait belt Activity Tolerance: Patient limited by fatigue;Patient limited by pain;Other (comment) (c/o dizziness) Patient left: in bed;with call bell/phone within reach Nurse Communication: Mobility status         Time: 1610-9604 PT Time Calculation (min) (ACUTE ONLY): 38 min   Charges:   PT Evaluation $Initial PT Evaluation Tier I: 1 Procedure PT Treatments $Gait Training: 8-22 mins $Therapeutic Exercise: 8-22 mins   PT G Codes:        Narcissa Melder 10/02/2014, 12:33 PM

## 2014-09-07 NOTE — Progress Notes (Signed)
Patient ID: Traci Hale, female   DOB: 09-Aug-1924, 81 y.o.   MRN: [id number] Tolerated surgery very well.  Only mild pain this am.  Acute on chronic blood loss anemia with stable vitals.  Denies chest pain.  Can be up with full weight as tolerated right hip; no hip precautions - only up with assistance and with therapy.

## 2014-09-07 NOTE — Progress Notes (Signed)
OT Cancellation Note  Patient Details Name: Traci Hale MRN: [id number] DOB: 1924/09/14   Cancelled Treatment:    Reason Eval/Treat Not Completed: Other (comment) Note from PT eval that pt was not able to take any steps with walker today. Will hold off on OT eval and check back on pt.  Lennox LaityStone, [facility]  [phone] 09/07/2014, 1:55 PM

## 2014-09-07 NOTE — Progress Notes (Signed)
Patient Profile: 81 yo woman with PMH of systolic HF (EF 35-40% 01/2014), hypertension, dyslipidemia, GERD and recent hospitalization from 3/9-3/17 for PNA/sepsis requiring mechanical ventilation who had an episode of atrial fibrillation with RVR in the hospital, who was re-admitted 09/02/14 for right hip fracture after a mechanical fall. Also with elevated troponins 1.03-->2.14 -->6.09 and increased O2 requirements.   Op: RT HIP HEMIARTHROPLASTY 09/06/14  POD#1  Subjective: No chest pain or SOB, stated she was doing well  Objective: Vital signs in last 24 hours: Temp:  [97.4 F (36.3 C)-99 F (37.2 C)] 98.7 F (37.1 C) (06/06 0800) Pulse Rate:  [80-94] 87 (06/06 0845) Resp:  [13-28] 24 (06/06 0800) BP: (105-164)/(50-90) 139/53 mmHg (06/06 0845) SpO2:  [95 %-100 %] 99 % (06/06 0800) Arterial Line BP: (122-166)/(44-76) 142/53 mmHg (06/06 0800) Weight:  [129 lb 10.1 oz (58.8 kg)] 129 lb 10.1 oz (58.8 kg) (06/06 0400) Weight change: 9 lb 11.2 oz (4.4 kg) Last BM Date: 09/02/14 Intake/Output from previous day: -260 06/05 0701 - 06/06 0700 In: 2040 [I.V.:1530; IV Piggyback:500] Out: 2300 [Urine:2200; Blood:100] Intake/Output this shift: Total I/O In: 20 [I.V.:20] Out: 125 [Urine:125]  PE: General:Pleasant affect, NAD Skin:Warm and dry, brisk capillary refill HEENT:normocephalic, sclera clear, mucus membranes moist, glasses in place Heart:S1S2 RRR with 2-3/6 systolic murmur, no gallup, rub or click Lungs:clear ant,  without rales, rhonchi, or wheezes ZOX:WRUEAbd:soft, non tender, + BS, do not palpate liver spleen or masses Ext:no lower ext edema,  2+ radial pulses Neuro:alert and oriented X 3, MAE, follows commands, + facial symmetry TELE:  SR to mild ST . PVCs on occ.     Lab Results:  Recent Labs  09/06/14 0445 09/07/14 0705  WBC 5.3 7.2  HGB 11.3* 10.3*  HCT 35.7* 32.0*  PLT 295 300   BMET  Recent Labs  09/06/14 0445 09/07/14 0705  NA 139 140  K 3.5 3.6  CL  99* 99*  CO2 31 29  GLUCOSE 105* 92  BUN 30* 23*  CREATININE 1.05* 0.70  CALCIUM 8.7* 8.8*   No results for input(s): TROPONINI in the last 72 hours.  Invalid input(s): CK, MB  No results found for: CHOL, HDL, LDLCALC, LDLDIRECT, TRIG, CHOLHDL No results found for: [id number]   Lab Results  Component Value Date   TSH 1.000 06/13/2014      Studies/Results: Pelvis Portable  09/06/2014   CLINICAL DATA:  Right hip arthroplasty.  EXAM: PORTABLE PELVIS 1-2 VIEWS  COMPARISON:  06/30/2014, 6 1 16   FINDINGS: Right hip total arthroplasty for repair of femoral neck fracture. No fracture dislocation.  IMPRESSION: Right total hip arthroplasty without complication.   Electronically Signed   By: Genevive BiStewart  Edmunds M.D.   On: 09/06/2014 11:12   Dg Chest Portable 1 View  09/06/2014   CLINICAL DATA:  Preop for hip surgery. Hip fracture. Recent pneumonia.  EXAM: PORTABLE CHEST - 1 VIEW  COMPARISON:  One-view chest x-ray 09/02/2014. Two-view chest x-ray 07/22/2014  FINDINGS: The heart size is normal. Atherosclerotic calcifications are present at the aortic arch. Aeration in the upper lobes has improved. A diffuse interstitial pattern is increased from the baseline. Subtle asymmetric left-sided airspace densities are present. No focal airspace consolidation is evident.  IMPRESSION: 1. Improved aeration of both lung apices. 2. Atherosclerosis. 3. A diffuse interstitial pattern is slightly increased, suggesting edema superimposed on emphysema. Subtle left-sided airspace disease raises possibility of pneumonia.   Electronically Signed   By: Virl Sonhristopher  Mattern M.D.  On: 09/06/2014 07:41   Dg Hip Operative Unilat With Pelvis Right  09/06/2014   CLINICAL DATA:  Hip surgery  EXAM: OPERATIVE RIGHT HIP (WITH PELVIS IF PERFORMED) 4 VIEWS  TECHNIQUE: Fluoroscopic spot image(s) were submitted for interpretation post-operatively.  FLUOROSCOPY TIME:  Radiation Exposure Index (as provided by the fluoroscopic device): Not provided   If the device does not provide the exposure index:  Fluoroscopy Time:  0 minutes 9 seconds  Number of Acquired Images:  4  COMPARISON:  09/02/2014  FINDINGS: Orthopedic hardware proximal LEFT femur post ORIF.  New RIGHT hip replacement with prosthetic components in expected positions.  No fracture or dislocation.  Diffuse osseous demineralization.  Visualized pelvis intact.  IMPRESSION: Post RIGHT hip replacement for RIGHT femoral neck fracture.  No acute complication.  Osseous demineralization.   Electronically Signed   By: Ulyses Southward M.D.   On: 09/06/2014 10:50    Medications: I have reviewed the patient's current medications. Scheduled Meds: . aspirin EC  325 mg Oral Q breakfast  . carvedilol  3.125 mg Oral BID WC  . cefTAZidime (FORTAZ)  IV  2 g Intravenous Q12H  . cholecalciferol  5,000 Units Oral Daily  . folic acid-pyridoxine-cyancobalamin  1 tablet Oral Daily  . furosemide  40 mg Oral BID  . lisinopril  2.5 mg Oral Daily  . NIFEdipine  60 mg Oral Daily  . omega-3 acid ethyl esters  1 capsule Oral Daily  . simvastatin  20 mg Oral Daily  . vancomycin  500 mg Intravenous Q12H   Continuous Infusions: . sodium chloride 10 mL/hr at 09/06/14 1141   PRN Meds:.acetaminophen **OR** acetaminophen, hydrALAZINE, HYDROcodone-acetaminophen, menthol-cetylpyridinium **OR** phenol, methocarbamol **OR** methocarbamol (ROBAXIN)  IV, metoCLOPramide **OR** metoCLOPramide (REGLAN) injection, morphine injection, ondansetron **OR** ondansetron (ZOFRAN) IV  Assessment/Plan: Principal Problem:   Femoral neck fracture Active Problems:   Acute on chronic systolic and diastolic heart failure, NYHA class 3   HTN (hypertension)   Hyperlipidemia   Hypokalemia   Chronic combined systolic and diastolic heart failure, NYHA class 1   Acute respiratory failure with hypoxia   Hip fracture   Elevated troponin   Fall   NSTEMI (non-ST elevated myocardial infarction)   Pressure ulcer  1. NSTEMI Robust elevation  in cardiac markers with diffuse TWI on ekg. Currently chest pain free. Agreed with Dr Sw[place] that given her advanced age and comoribidites that she is not a candidate for cardiology procedures. Would treat conservatively.  2. Ischemic CM EF 35-40% Gentle diuresis as able On coreg CHF: systolic and diastolic HF, with EF now as previously EF 35-40% with G2DD. With increased O2 requirements. Sats stable on 4L. Also moderate MR and mild-mod TR and elevated PA pk pressure at 56 mmHg has been diuresed  -[id number] since admit.  Now on lasix po 40 mg BID.   3. Hip fx Recurrent history of prior falls.  Given acute on chronic systolic CHF and NSTEMI she was considered high risk for general anesthesia and surgery.   She is not a candidate for invasive ischemic evaluation with cardiac cath.POST OP DAY 1, stable.   LOS: 5 days   Time spent with pt. :15 minutes. [facility] R  Nurse Practitioner Certified Pager [phone] or after 5pm and on weekends call [phone] 09/07/2014, 9:04 AM   I have examined the patient and reviewed assessment and plan and discussed with patient.  Agree with above as stated.  Pt chest pain free.  No arrhythmias reported.  She is not a candidate  for invasive testing.  OK to transfer to telemetry.   Waunita Sandstrom S.

## 2014-09-08 DIAGNOSIS — R7989 Other specified abnormal findings of blood chemistry: Secondary | ICD-10-CM

## 2014-09-08 LAB — GLUCOSE, CAPILLARY
GLUCOSE-CAPILLARY: 95 mg/dL (ref 65–99)
Glucose-Capillary: 106 mg/dL — ABNORMAL HIGH (ref 65–99)
Glucose-Capillary: 132 mg/dL — ABNORMAL HIGH (ref 65–99)
Glucose-Capillary: 148 mg/dL — ABNORMAL HIGH (ref 65–99)
Glucose-Capillary: 87 mg/dL (ref 65–99)
Glucose-Capillary: 95 mg/dL (ref 65–99)
Glucose-Capillary: 97 mg/dL (ref 65–99)

## 2014-09-08 LAB — BASIC METABOLIC PANEL
Anion gap: 10 (ref 5–15)
BUN: 29 mg/dL — AB (ref 6–20)
CO2: 32 mmol/L (ref 22–32)
Calcium: 8.9 mg/dL (ref 8.9–10.3)
Chloride: 98 mmol/L — ABNORMAL LOW (ref 101–111)
Creatinine, Ser: 0.87 mg/dL (ref 0.44–1.00)
GFR calc Af Amer: >60 mL/min (ref >60)
GFR, EST NON AFRICAN AMERICAN: 58 mL/min — AB (ref >60)
Glucose, Bld: 109 mg/dL — ABNORMAL HIGH (ref 65–99)
Potassium: 3.6 mmol/L (ref 3.5–5.1)
SODIUM: 140 mmol/L (ref 135–145)

## 2014-09-08 LAB — CBC
HCT: 31.2 % — ABNORMAL LOW (ref 36.0–46.0)
HEMOGLOBIN: 10.2 g/dL — AB (ref 12.0–15.0)
MCH: 34.3 pg — ABNORMAL HIGH (ref 26.0–34.0)
MCHC: 32.7 g/dL (ref 30.0–36.0)
MCV: 105.1 fL — AB (ref 78.0–100.0)
Platelets: 292 10*3/uL (ref 150–400)
RBC: 2.97 MIL/uL — ABNORMAL LOW (ref 3.87–5.11)
RDW: 14.4 % (ref 11.5–15.5)
WBC: 6.9 10*3/uL (ref 4.0–10.5)

## 2014-09-08 MED ORDER — HEPARIN SODIUM (PORCINE) 5000 UNIT/ML IJ SOLN
5000.0000 [IU] | Freq: Three times a day (TID) | INTRAMUSCULAR | Status: DC
  Administered 2014-09-08 – 2014-09-09 (×4): 5000 [IU] via SUBCUTANEOUS
  Filled 2014-09-08 (×3): qty 1

## 2014-09-08 MED ORDER — HYDROCODONE-ACETAMINOPHEN 5-325 MG PO TABS: 1.0000 | ORAL_TABLET | Freq: Four times a day (QID) | ORAL | Status: DC | PRN

## 2014-09-08 MED ORDER — ASPIRIN 325 MG PO TBEC: 325.0000 mg | DELAYED_RELEASE_TABLET | Freq: Every day | ORAL | Status: AC

## 2014-09-08 NOTE — Progress Notes (Signed)
Patient ID: Traci Hale, female   DOB: 18-Oct-1924, 81 y.o.   MRN: [id number] No acute changes.  Vitals and H/H stable.  Very limited mobility with therapy mainly due to deconditioning.  Will need SNF placement likely.  Right hip stable.

## 2014-09-08 NOTE — Progress Notes (Signed)
Patient Profile: 81 yo woman with PMH of systolic HF (EF 35-40% 01/2014), hypertension, dyslipidemia, GERD and recent hospitalization from 3/9-3/17 for PNA/sepsis requiring mechanical ventilation who had an episode of atrial fibrillation with RVR in the hospital, who was re-admitted 09/02/14 for right hip fracture after a mechanical fall. Also with elevated troponins 1.03-->2.14 -->6.09 and increased O2 requirements.   Op: RT HIP HEMIARTHROPLASTY 09/06/14 POD#2    Subjective: No chest pain, + SOB with exertion   Objective: Vital signs in last 24 hours: Temp:  [97.5 F (36.4 C)-99.3 F (37.4 C)] 99.3 F (37.4 C) (06/07 0439) Pulse Rate:  [68-89] 79 (06/07 0439) Resp:  [14-27] 20 (06/07 0439) BP: (101-139)/(44-53) 111/44 mmHg (06/07 0439) SpO2:  [98 %-100 %] 100 % (06/07 0439) Arterial Line BP: (116-147)/(41-74) 120/44 mmHg (06/06 1700) Weight change:  Last BM Date: 09/02/14 Intake/Output from previous day: -225 06/06 0701 - 06/07 0700 In: 830 [P.O.:580; I.V.:250] Out: 975 [Urine:975] Intake/Output this shift:    PE: General:Pleasant affect, NAD Skin:Warm and dry, brisk capillary refill HEENT:normocephalic, sclera clear, mucus membranes moist Neck:supple, + JVD  Heart:S1S2 RRR with 2/6 systolic murmur, no gallup, rub or click Lungs:clear, ant.  without rales, rhonchi, or wheezes ZOX:WRUE, non tender, + BS, do not palpate liver spleen or masses Ext:no lower ext edema, 2+ pedal pulses, 2+ radial pulses Neuro:alert and oriented, MAE, follows commands, + facial symmetry Tele: SR with occ PVC   Lab Results:  Recent Labs  09/07/14 0705 09/08/14 0420  WBC 7.2 6.9  HGB 10.3* 10.2*  HCT 32.0* 31.2*  PLT 300 292   BMET  Recent Labs  09/07/14 0705 09/08/14 0420  NA 140 140  K 3.6 3.6  CL 99* 98*  CO2 29 32  GLUCOSE 92 109*  BUN 23* 29*  CREATININE 0.70 0.87  CALCIUM 8.8* 8.9   No results for input(s): TROPONINI in the last 72 hours.  Invalid input(s):  CK, MB  No results found for: CHOL, HDL, LDLCALC, LDLDIRECT, TRIG, CHOLHDL No results found for: AVWU9W   Lab Results  Component Value Date   TSH 1.000 06/13/2014     Studies/Results: Pelvis Portable  09/06/2014   CLINICAL DATA:  Right hip arthroplasty.  EXAM: PORTABLE PELVIS 1-2 VIEWS  COMPARISON:  06/30/2014, FINDINGS: Right hip total arthroplasty for repair of femoral neck fracture. No fracture dislocation.  IMPRESSION: Right total hip arthroplasty without complication.   Electronically Signed   By: Genevive Bi M.D.   On: 09/06/2014 11:12   Dg Hip Operative Unilat With Pelvis Right  09/06/2014   CLINICAL DATA:  Hip surgery  EXAM: OPERATIVE RIGHT HIP (WITH PELVIS IF PERFORMED) 4 VIEWS  TECHNIQUE: Fluoroscopic spot image(s) were submitted for interpretation post-operatively.  FLUOROSCOPY TIME:  Radiation Exposure Index (as provided by the fluoroscopic device): Not provided  If the device does not provide the exposure index:  Fluoroscopy Time:  0 minutes 9 seconds  Number of Acquired Images:  4  COMPARISON:  09/02/2014  FINDINGS: Orthopedic hardware proximal LEFT femur post ORIF.  New RIGHT hip replacement with prosthetic components in expected positions.  No fracture or dislocation.  Diffuse osseous demineralization.  Visualized pelvis intact.  IMPRESSION: Post RIGHT hip replacement for RIGHT femoral neck fracture.  No acute complication.  Osseous demineralization.   Electronically Signed   By: Ulyses Southward M.D.   On: 09/06/2014 10:50    Medications: I have reviewed the patient's current medications. Scheduled Meds: . aspirin  EC  325 mg Oral Q breakfast  . carvedilol  3.125 mg Oral BID WC  . cholecalciferol  5,000 Units Oral Daily  . folic acid-pyridoxine-cyancobalamin  1 tablet Oral Daily  . furosemide  40 mg Oral BID  . lisinopril  2.5 mg Oral Daily  . NIFEdipine  60 mg Oral Daily  . omega-3 acid ethyl esters  1 capsule Oral Daily  . simvastatin  20 mg Oral Daily    Continuous Infusions: . sodium chloride 10 mL/hr at 09/08/14 0622   PRN Meds:.acetaminophen **OR** acetaminophen, hydrALAZINE, HYDROcodone-acetaminophen, menthol-cetylpyridinium **OR** phenol, methocarbamol **OR** methocarbamol (ROBAXIN)  IV, metoCLOPramide **OR** metoCLOPramide (REGLAN) injection, morphine injection, ondansetron **OR** ondansetron (ZOFRAN) IV  Assessment/Plan: Principal Problem:   Femoral neck fracture Active Problems:   Acute on chronic systolic and diastolic heart failure, NYHA class 3   HTN (hypertension)   Hyperlipidemia   Hypokalemia   Chronic combined systolic and diastolic heart failure, NYHA class 1   Acute respiratory failure with hypoxia   Hip fracture   Elevated troponin   Fall   NSTEMI (non-ST elevated myocardial infarction)   Pressure ulcer  1. NSTEMI Robust elevation in cardiac markers with diffuse TWI on ekg. Currently chest pain free. Agreed with Dr SwazilandJordan that given her advanced age and comoribidites that she is not a candidate for cardiology procedures. Would treat conservatively. Doing well  2. Ischemic CM EF 35-40% Gentle diuresis as able On coreg CHF: systolic and diastolic HF, with EF now as previously EF 35-40% with G2DD. Sats stable on RA. Also moderate MR and mild-mod TR and elevated PA pk pressure at 56 mmHg has been diuresed -5505 since admit. Now on lasix po 40 mg BID.   3. Hip fx Recurrent history of prior falls.  Given acute on chronic systolic CHF and NSTEMI she was considered high risk for general anesthesia and surgery but has done well. She is not a candidate for invasive ischemic evaluation with cardiac cath.POST OP DAY 2, stable.   LOS: 6 days   Time spent with pt. :15 minutes. [facility] HospitalNGOLD,LAURA R  Nurse Practitioner Certified Pager [phone] or after 5pm and on weekends call [phone] 09/08/2014, 8:36 AM   I have examined the patient and reviewed assessment and plan and discussed with patient.  Agree with above as  stated.  Stable from a cardiac standopint.  Watch for fluid overload.  No chest pain.  No further testing needed.  Decreased EF by echo. Appears euvolemic at this time. Continue current dose of Lasix.   VARANASI,JAYADEEP S.

## 2014-09-08 NOTE — Progress Notes (Signed)
[facility]                                                                              Patient Demographics  Traci Hale, is a 81 y.o. female, DOB - Jan 29, 1925, ZOX:[id number]  Admit date - 09/02/2014   Admitting Physician Eduard Clos, MD  Outpatient Primary MD for the patient is FULP, CAMMIE, MD  LOS - 6   Chief Complaint  Patient presents with  . Fall  . Hip Pain       Brief HPI   Traci Hale is a 81 y.o. female with history of chronic systolic heart failure last year measured was 35-40%, hypertension, chronic anemia, dyslipidemia was brought to the ER after patient had a fall at the backyard of her house. Patient states she still been fairly not hit her head or lose consciousness. In the ER x-rays revealed right hip fracture and Dr. Magnus Ivan on call orthopedic surgeon was consulted. Patient's chest x-ray showed infiltrates versus edema and patient was requiring at least 3 L of O2 in ED.  Patient reported some shortness of breath over the last few days. She denied any chest pain, fevers or chills. Patient's troponinwas mildly elevated and Dr. Tresa Endo on-call cardiologist was consulted. Patient was admitted for further management. Patient denied any headache visual symptoms nausea vomiting abdominal pain and diarrhea   Assessment & Plan    Principal Problem:   Femoral neck fracture/right hip fracture after mechanical fall status post right hip hemiarthroplasty - Postop day #2,  currently stable - Appreciate cardiology and orthopedics recommendations -Follow H&H closely, currently stable at 10.3, keep hemoglobin above 10  - Start physical therapy today  Active Problems: NSTEMI: - Cardiology consulted, patient has significant increase in the troponins, last one 6.09, currently denying any chest pain - Cardiology following, not a candidate for invasive ischemic evaluation, continue to diurese. - 2-D echo EF of 35-40% with diffuse hypokinesis,  grade 2 diastolic dysfunction - Currently on Lasix, nifedipine, Coreg, statin.   Acute hypoxic respiratory failure with acute on chronic combined systolic and diastolic CHF: Improving, O2 sats 100% on room air - 2-D echo EF of 35-40% with diffuse hypokinesis, grade 2 diastolic dysfunction, no significant change in the EF from the prior echo -  Continue Lasix, negative balance of 5.5 L, transitioned to oral Lasix  - Chest x-ray with possible infiltrates versus edema, patient was placed on IV antibiotics,Pro-calcitonin 0.21, no leukocytosis, afebrile, no URI symptoms or hypoxia, dc'ed IV antibiotics   Uncontrolled hypertension -  currently stable, Continue Lasix, lisinopril, Coreg, nifedipine, hydralazine  Chronic anemia - Closely monitor, keep hemoglobin > 10 due to NSTEMI, follow CBC daily  Code Status: dnr   Family Communication: Discussed in detail with the patient, all imaging results, lab results explained to the patient   Disposition Plan:  Start physical therapy and ambulation, will likely need skilled nursing facility  Time Spent in minutes  25 minutes  Procedures  2-D echocardiogram Chest x-ray  Consults   Cardiology  Orthopedics   DVT Prophylaxis  heparin subcutaneous for DVT prophylaxis  Medications  Scheduled Meds: . aspirin EC  325  mg Oral Q breakfast  . carvedilol  3.125 mg Oral BID WC  . cholecalciferol  5,000 Units Oral Daily  . folic acid-pyridoxine-cyancobalamin  1 tablet Oral Daily  . furosemide  40 mg Oral BID  . lisinopril  2.5 mg Oral Daily  . NIFEdipine  60 mg Oral Daily  . omega-3 acid ethyl esters  1 capsule Oral Daily  . simvastatin  20 mg Oral Daily   Continuous Infusions: . sodium chloride 10 mL/hr at 09/08/14 0622   PRN Meds:.acetaminophen **OR** acetaminophen, hydrALAZINE, HYDROcodone-acetaminophen, menthol-cetylpyridinium **OR** phenol, methocarbamol **OR** methocarbamol (ROBAXIN)  IV, metoCLOPramide **OR** metoCLOPramide (REGLAN)  injection, morphine injection, ondansetron **OR** ondansetron (ZOFRAN) IV   Antibiotics   Anti-infectives    Start     Dose/Rate Route Frequency Ordered Stop   09/03/14 0600  cefTAZidime (FORTAZ) 2 g in dextrose 5 % 50 mL IVPB  Status:  Discontinued     2 g 100 mL/hr over 30 Minutes Intravenous Every 12 hours 09/03/14 0134 09/07/14 1116   09/03/14 0200  vancomycin (VANCOCIN) 500 mg in sodium chloride 0.9 % 100 mL IVPB  Status:  Discontinued     500 mg 100 mL/hr over 60 Minutes Intravenous Every 12 hours 09/03/14 0126 09/07/14 1116   09/02/14 2215  cefTRIAXone (ROCEPHIN) 1 g in dextrose 5 % 50 mL IVPB     1 g 100 mL/hr over 30 Minutes Intravenous  Once 09/02/14 2201 09/03/14 1224   09/02/14 2215  azithromycin (ZITHROMAX) 500 mg in dextrose 5 % 250 mL IVPB     500 mg 250 mL/hr over 60 Minutes Intravenous  Once 09/02/14 2201 09/03/14 1224        Subjective:   Traci Hale was seen and examined today. No acute issues overnight, feels okay. No chest pain or shortness of breath. Afebrile. No acute events overnight.   Patient denies dizziness,  abdominal pain, N/V/D/C, new weakness, numbess, tingling.   Objective:   Blood pressure 111/44, pulse 79, temperature 99.3 F (37.4 C), temperature source Oral, resp. rate 20, height 5\' 5"  (1.651 m), weight 58.8 kg (129 lb 10.1 oz), SpO2 100 %.  Wt Readings from Last 3 Encounters:  09/07/14 58.8 kg (129 lb 10.1 oz)  06/12/14 58.6 kg (129 lb 3 oz)  01/10/14 49.42 kg (108 lb 15.2 oz)     Intake/Output Summary (Last 24 hours) at 09/08/14 1034 Last data filed at 09/08/14 0700  Gross per 24 hour  Intake    690 ml  Output    850 ml  Net   -160 ml    Exam  General: Alert and oriented , NAD  HEENT:  PERRLA, EOMI, Anicteic Sclera  Neck: Supple, no JVD  CVS: S1 S2 clear  Respiratory: clear to ausculation b/l  Abdomen: Soft, nontender, nondistended, bowel sounds  Ext: no cyanosis clubbing or edema, both extremities in the heel  protectors and SCDs  Neuro: no new deficits  Skin: No rashes  Psych: Normal affect and demeanor   Data Review   Micro Results Recent Results (from the past 240 hour(s))  Surgical pcr screen     Status: None   Collection Time: 09/06/14 12:34 AM  Result Value Ref Range Status   MRSA, PCR NEGATIVE NEGATIVE Final   Staphylococcus aureus NEGATIVE NEGATIVE Final    Comment:        The Xpert SA Assay (FDA approved for NASAL specimens in patients over 79 years of age), is one component of a comprehensive surveillance program.  Test  performance has been validated by [facility] for patients greater than or equal to 36 year old. It is not intended to diagnose infection nor to guide or monitor treatment.     Radiology Reports Dg Chest 2 View  09/02/2014   CLINICAL DATA:  Fall today.  Hip fx. Htn. Chf  EXAM: CHEST - 2 VIEW  COMPARISON:  07/22/2014  FINDINGS: New interstitial and airspace opacities involving upper lung zones more than bases, right greater than left. Mild cardiomegaly stable. Tortuous atheromatous aorta. No pneumothorax. No effusion. Visualized skeletal structures are unremarkable.  IMPRESSION: 1. New asymmetric infiltrates or edema as above. 2. Stable mild cardiomegaly   Electronically Signed   By: Corlis Leak M.D.   On: 09/02/2014 19:50   Pelvis Portable  09/06/2014   CLINICAL DATA:  Right hip arthroplasty.  EXAM: PORTABLE PELVIS 1-2 VIEWS  COMPARISON:  06/30/2014, FINDINGS: Right hip total arthroplasty for repair of femoral neck fracture. No fracture dislocation.  IMPRESSION: Right total hip arthroplasty without complication.   Electronically Signed   By: Genevive Bi M.D.   On: 09/06/2014 11:12   Dg Chest Portable 1 View  09/06/2014   CLINICAL DATA:  Preop for hip surgery. Hip fracture. Recent pneumonia.  EXAM: PORTABLE CHEST - 1 VIEW  COMPARISON:  One-view chest x-ray 09/02/2014. Two-view chest x-ray 07/22/2014  FINDINGS: The heart size is normal.  Atherosclerotic calcifications are present at the aortic arch. Aeration in the upper lobes has improved. A diffuse interstitial pattern is increased from the baseline. Subtle asymmetric left-sided airspace densities are present. No focal airspace consolidation is evident.  IMPRESSION: 1. Improved aeration of both lung apices. 2. Atherosclerosis. 3. A diffuse interstitial pattern is slightly increased, suggesting edema superimposed on emphysema. Subtle left-sided airspace disease raises possibility of pneumonia.   Electronically Signed   By: Marin Roberts M.D.   On: 09/06/2014 07:41   Dg Hip Operative Unilat With Pelvis Right  09/06/2014   CLINICAL DATA:  Hip surgery  EXAM: OPERATIVE RIGHT HIP (WITH PELVIS IF PERFORMED) 4 VIEWS  TECHNIQUE: Fluoroscopic spot image(s) were submitted for interpretation post-operatively.  FLUOROSCOPY TIME:  Radiation Exposure Index (as provided by the fluoroscopic device): Not provided  If the device does not provide the exposure index:  Fluoroscopy Time:  0 minutes 9 seconds  Number of Acquired Images:  4  COMPARISON:  09/02/2014  FINDINGS: Orthopedic hardware proximal LEFT femur post ORIF.  New RIGHT hip replacement with prosthetic components in expected positions.  No fracture or dislocation.  Diffuse osseous demineralization.  Visualized pelvis intact.  IMPRESSION: Post RIGHT hip replacement for RIGHT femoral neck fracture.  No acute complication.  Osseous demineralization.   Electronically Signed   By: Ulyses Southward M.D.   On: 09/06/2014 10:50   Dg Hip Unilat  With Pelvis 2-3 Views Right  09/02/2014   CLINICAL DATA:  Pain post fall at home today  EXAM: RIGHT HIP (WITH PELVIS) 2-3 VIEWS  COMPARISON:  06/10/2014  FINDINGS: Midcervical fracture of the right femur, impacted with some displacement and foreshortening. No dislocation. IM rod and sliding pin fixation hardware across the left femoral neck as before. Bony pelvis intact. Degenerative disc disease in the visualized  lower lumbar spine.  IMPRESSION: 1. Displaced midcervical right femur fracture fracture.   Electronically Signed   By: Corlis Leak M.D.   On: 09/02/2014 17:30   Dg Femur, Min 2 Views Right  09/02/2014   CLINICAL DATA:  Proximal RIGHT femur fracture.  EXAM: RIGHT FEMUR 2 VIEWS  COMPARISON:  None.  FINDINGS: Distal femur is intact. Tricompartmental osteoarthritis of the knee is incidentally noted.  IMPRESSION: Intact distal femur.   Electronically Signed   By: Andreas Newport M.D.   On: 09/02/2014 20:17    CBC  Recent Labs Lab 09/02/14 1835 09/03/14 0156 09/05/14 0510 09/06/14 0445 09/07/14 0705 09/08/14 0420  WBC 5.6 5.5 6.1 5.3 7.2 6.9  HGB 11.0* 11.8* 10.9* 11.3* 10.3* 10.2*  HCT 34.9* 36.6 34.1* 35.7* 32.0* 31.2*  PLT 369 386 274 295 300 292  MCV 105.1* 105.2* 102.7* 104.1* 103.6* 105.1*  MCH 33.1 33.9 32.8 32.9 33.3 34.3*  MCHC 31.5 32.2 32.0 31.7 32.2 32.7  RDW 14.6 14.7 14.3 14.2 14.2 14.4  LYMPHSABS 0.5* 0.8  --   --   --   --   MONOABS 0.2 0.4  --   --   --   --   EOSABS 0.0 0.0  --   --   --   --   BASOSABS 0.0 0.0  --   --   --   --     Chemistries   Recent Labs Lab 09/03/14 0156 09/04/14 0427 09/05/14 0510 09/06/14 0445 09/07/14 0705 09/08/14 0420  NA 141 139 137 139 140 140  K 4.1 3.2* 3.6 3.5 3.6 3.6  CL 109 103 98* 99* 99* 98*  CO2 32  GLUCOSE 154* 106* 90 105* 92 109*  BUN 19 26* 29* 30* 23* 29*  CREATININE 0.84 0.87 0.92 1.05* 0.70 0.87  CALCIUM 8.9 8.5* 8.8* 8.7* 8.8* 8.9  AST 33  --   --   --   --   --   ALT 12*  --   --   --   --   --   ALKPHOS 62  --   --   --   --   --   BILITOT 0.4  --   --   --   --   --    ------------------------------------------------------------------------------------------------------------------ estimated creatinine clearance is 40.2 mL/min (by C-G formula based on Cr of 0.87). ------------------------------------------------------------------------------------------------------------------ No results  for input(s): HGBA1C in the last 72 hours. ------------------------------------------------------------------------------------------------------------------ No results for input(s): CHOL, HDL, LDLCALC, TRIG, CHOLHDL, LDLDIRECT in the last 72 hours. ------------------------------------------------------------------------------------------------------------------ No results for input(s): TSH, T4TOTAL, T3FREE, THYROIDAB in the last 72 hours.  Invalid input(s): FREET3 ------------------------------------------------------------------------------------------------------------------ No results for input(s): VITAMINB12, FOLATE, FERRITIN, TIBC, IRON, RETICCTPCT in the last 72 hours.  Coagulation profile  Recent Labs Lab 09/03/14 0156  INR 1.22    No results for input(s): DDIMER in the last 72 hours.  Cardiac Enzymes  Recent Labs Lab 09/03/14 0156 09/03/14 0650 09/03/14 1233  TROPONINI 2.14* 5.87* 6.09*   ------------------------------------------------------------------------------------------------------------------ Invalid input(s): POCBNP   Recent Labs  09/06/14 1148 09/06/14 1900 09/06/14 2352 09/07/14 1819 09/08/14 0010 09/08/14 0608  GLUCAP 107* 115* 101* 87 132* 106*     Jurnie Garritano M.D. [facility] 09/08/2014, 10:34 AM  Pager: [phone]   Between 7am to 7pm - call Pager - [phone]  After 7pm go to [email or web address] - password [email or web address]  Call night coverage person covering after 7pm

## 2014-09-08 NOTE — Evaluation (Signed)
Occupational Therapy Evaluation Patient Details Name: Traci CopperOphelia G Schimpf MRN: [id number] DOB: 1924/08/12 Today's Date: 09/08/2014    History of Present Illness Pt admitted s/p fall with R hip fx with hemi-arthroplasty repair via anterior direct approach   Clinical Impression   This 81 year old female was admitted for the above surgery.  She will benefit from skilled OT to increase mobility related to ADLs to decrease burden of care.  Pt will benefit from follow up OT in SNF to restore independence with adls.  Pt needs max A x 2 for bed mobility and unable to stand today.  Goals in acute are for mod A x 2 for bed mobility, standing for ADLs and toilet transfers.    Follow Up Recommendations  SNF    Equipment Recommendations  3 in 1 bedside comode    Recommendations for Other Services       Precautions / Restrictions Precautions Precautions: Fall Restrictions Other Position/Activity Restrictions: WBAT      Mobility Bed Mobility   Bed Mobility: Supine to Sit;Sit to Supine     Supine to sit: Max assist;+2 for physical assistance Sit to supine: Total assist   General bed mobility comments: cued for technique.  utilized pad to turn pt prior to assisting her to lie down  Transfers                 General transfer comment: not attempted    Balance                                            ADL Overall ADL's : Needs assistance/impaired     Grooming: Set up;Sitting   Upper Body Bathing: Set up;Sitting   Lower Body Bathing: Maximal assistance;+2 for physical assistance;Sit to/from stand   Upper Body Dressing : Minimal assistance;Sitting (iv)   Lower Body Dressing: Total assistance;+2 for physical assistance;Sit to/from stand                 General ADL Comments: pt sat at EOB for approximately 3 minutes then c/o being "swimmyheaded". Initially, pt was leaning to L, unweighting R side; able to correct with cues.  Returned to supine.   Did not stand this session, but pt required max A x 2 yesterday with PT.      Vision     Perception     Praxis      Pertinent Vitals/Pain Faces Pain Scale: Hurts even more Pain Location: R hip Pain Descriptors / Indicators: Aching Pain Intervention(s): Limited activity within patient's tolerance;Monitored during session;Premedicated before session;Repositioned     Hand Dominance     Extremity/Trunk Assessment Upper Extremity Assessment Upper Extremity Assessment: Generalized weakness           Communication Communication Communication: HOH   Cognition Arousal/Alertness: Awake/alert Behavior During Therapy: WFL for tasks assessed/performed Overall Cognitive Status: Within Functional Limits for tasks assessed                     General Comments       Exercises       Shoulder Instructions      Home Living Family/patient expects to be discharged to:: Private residence Living Arrangements: Alone  Prior Functioning/Environment Level of Independence: Independent with assistive device(s)             OT Diagnosis: Generalized weakness   OT Problem List: Decreased strength;Decreased activity tolerance;Decreased knowledge of use of DME or AE;Pain   OT Treatment/Interventions: Self-care/ADL training;DME and/or AE instruction;Patient/family education;Splinting    OT Goals(Current goals can be found in the care plan section) Acute Rehab OT Goals Patient Stated Goal: none stated:  agreeable to OT OT Goal Formulation: With patient Time For Goal Achievement: 09/15/14 Potential to Achieve Goals: Good ADL Goals Pt Will Transfer to Toilet: with mod assist;with +2 assist;bedside commode;stand pivot transfer Additional ADL Goal #1: pt will perform sit to stand with mod A x2 and maintain for 2 minutes with min A for adls Additional ADL Goal #2: pt will perform bed mobility with mod A in preparation for  adls/toilet transfers  OT Frequency: Min 2X/week   Barriers to D/C:            Co-evaluation              End of Session    Activity Tolerance:  (limited by lightheadedness) Patient left: in bed;with call bell/phone within reach;with bed alarm set   Time: 1511-1535 OT Time Calculation (min): 24 min Charges:  OT General Charges $OT Visit: 1 Procedure OT Evaluation $Initial OT Evaluation Tier I: 1 Procedure G-Codes:    Isami Mehra 10-03-2014, 4:09 PM Marica Otter, OTR/L [phone] 2014-10-03

## 2014-09-08 NOTE — Discharge Instructions (Signed)
Can attempt full weight bearing on right hip. No hip precautions. New dry dressing daily right hip incision as needed.

## 2014-09-09 DIAGNOSIS — I10 Essential (primary) hypertension: Secondary | ICD-10-CM

## 2014-09-09 DIAGNOSIS — S72001A Fracture of unspecified part of neck of right femur, initial encounter for closed fracture: Principal | ICD-10-CM

## 2014-09-09 DIAGNOSIS — E785 Hyperlipidemia, unspecified: Secondary | ICD-10-CM

## 2014-09-09 DIAGNOSIS — I5042 Chronic combined systolic (congestive) and diastolic (congestive) heart failure: Secondary | ICD-10-CM

## 2014-09-09 LAB — BASIC METABOLIC PANEL
Anion gap: 11 (ref 5–15)
BUN: 36 mg/dL — AB (ref 6–20)
CO2: 29 mmol/L (ref 22–32)
Calcium: 8.6 mg/dL — ABNORMAL LOW (ref 8.9–10.3)
Chloride: 98 mmol/L — ABNORMAL LOW (ref 101–111)
Creatinine, Ser: 0.9 mg/dL (ref 0.44–1.00)
GFR, EST NON AFRICAN AMERICAN: 55 mL/min — AB (ref >60)
Glucose, Bld: 102 mg/dL — ABNORMAL HIGH (ref 65–99)
Potassium: 3.2 mmol/L — ABNORMAL LOW (ref 3.5–5.1)
Sodium: 138 mmol/L (ref 135–145)

## 2014-09-09 LAB — CBC
HCT: 28.1 % — ABNORMAL LOW (ref 36.0–46.0)
Hemoglobin: 9.1 g/dL — ABNORMAL LOW (ref 12.0–15.0)
MCH: 33.6 pg (ref 26.0–34.0)
MCHC: 32.4 g/dL (ref 30.0–36.0)
MCV: 103.7 fL — AB (ref 78.0–100.0)
Platelets: 283 10*3/uL (ref 150–400)
RBC: 2.71 MIL/uL — ABNORMAL LOW (ref 3.87–5.11)
RDW: 14.1 % (ref 11.5–15.5)
WBC: 5.2 10*3/uL (ref 4.0–10.5)

## 2014-09-09 LAB — GLUCOSE, CAPILLARY
GLUCOSE-CAPILLARY: 112 mg/dL — AB (ref 65–99)
Glucose-Capillary: 83 mg/dL (ref 65–99)

## 2014-09-09 MED ORDER — POLYETHYLENE GLYCOL 3350 17 G PO PACK: 17.0000 g | PACK | Freq: Every day | ORAL | Status: DC

## 2014-09-09 MED ORDER — FUROSEMIDE 40 MG PO TABS: 40.0000 mg | ORAL_TABLET | Freq: Two times a day (BID) | ORAL | Status: DC

## 2014-09-09 MED ORDER — LISINOPRIL 2.5 MG PO TABS: 2.5000 mg | ORAL_TABLET | Freq: Every day | ORAL | Status: DC

## 2014-09-09 MED ORDER — DOCUSATE SODIUM 100 MG PO CAPS: 100.0000 mg | ORAL_CAPSULE | Freq: Two times a day (BID) | ORAL | Status: DC

## 2014-09-09 MED ORDER — POTASSIUM CHLORIDE CRYS ER 20 MEQ PO TBCR
40.0000 meq | EXTENDED_RELEASE_TABLET | Freq: Once | ORAL | Status: AC
  Administered 2014-09-09: 40 meq via ORAL
  Filled 2014-09-09: qty 2

## 2014-09-09 MED ORDER — NIFEDIPINE ER 60 MG PO TB24: 60.0000 mg | ORAL_TABLET | Freq: Every day | ORAL | Status: AC

## 2014-09-09 MED ORDER — ENSURE ENLIVE PO LIQD
237.0000 mL | Freq: Two times a day (BID) | ORAL | Status: DC
  Administered 2014-09-09: 237 mL via ORAL

## 2014-09-09 NOTE — Progress Notes (Addendum)
Called report to [place]  All questions answered.  Made RN aware that pt has not had a BM since 6/1.  MD made aware, paged MD to add stool softener to discharge summary list. No new orders to give pt stool softener before discharge.  Yellow packet waiting for PTAR pick up.  Pt valuables given back to pt, yellow slip signed verifying that she received her silvery necklace and $371 dollars in cash.

## 2014-09-09 NOTE — Progress Notes (Signed)
Physical Therapy Treatment Patient Details Name: Creed CopperOphelia G Calandra MRN: [id number] DOB: 1924/09/10 Today's Date: 09/09/2014    History of Present Illness Pt admitted s/p fall with R hip fx with hemi-arthroplasty repair via anterior direct approach    PT Comments    Pt progressing slowly with increased activity tolerance noted and decreased c/o dizziness with mobility.  Follow Up Recommendations  SNF     Equipment Recommendations  None recommended by PT    Recommendations for Other Services OT consult     Precautions / Restrictions Precautions Precautions: Fall Restrictions Weight Bearing Restrictions: No Other Position/Activity Restrictions: WBAT    Mobility  Bed Mobility Overal bed mobility: +2 for physical assistance;Needs Assistance Bed Mobility: Supine to Sit;Sit to Supine     Supine to sit: Max assist;+2 for physical assistance Sit to supine: Max assist;+2 for physical assistance   General bed mobility comments: cues for technique.  utilized pad.  Pt was able to assist with scooting to EOB when RLE supported  Transfers Overall transfer level: Needs assistance Equipment used: Rolling walker (2 wheeled) Transfers: Sit to/from Stand Sit to Stand: Mod assist;+2 physical assistance;From elevated surface         General transfer comment: cues for UE/LE placement; tactile cues to use LUE to push up from bed  Ambulation/Gait Ambulation/Gait assistance: +2 physical assistance;Min assist;Mod assist Ambulation Distance (Feet): 0 Feet Assistive device: Rolling walker (2 wheeled)       General Gait Details: Pt stood only with RW and min/mod assist x 2.  Pt standing ~ 90 sec before c/o fatigue and dizziness and returning to sitting   Stairs            Wheelchair Mobility    Modified Rankin (Stroke Patients Only)       Balance                                    Cognition Arousal/Alertness: Awake/alert Behavior During Therapy: WFL for  tasks assessed/performed Overall Cognitive Status: Within Functional Limits for tasks assessed                      Exercises Total Joint Exercises Ankle Circles/Pumps: AAROM;Both;15 reps;Supine Heel Slides: AAROM;Right;15 reps;Supine Hip ABduction/ADduction: AAROM;Right;10 reps;Supine    General Comments        Pertinent Vitals/Pain Pain Assessment: Faces Faces Pain Scale: Hurts even more Pain Location: Rhip Pain Descriptors / Indicators: Aching Pain Intervention(s): Limited activity within patient's tolerance;Monitored during session;Premedicated before session;Repositioned    Home Living                      Prior Function            PT Goals (current goals can now be found in the care plan section) Acute Rehab PT Goals Patient Stated Goal: none stated - agreeable to attempt sitting/standing PT Goal Formulation: With patient Time For Goal Achievement: 09/21/14 Potential to Achieve Goals: Fair Progress towards PT goals: Progressing toward goals    Frequency  Min 3X/week    PT Plan Current plan remains appropriate    Co-evaluation PT/OT/SLP Co-Evaluation/Treatment: Yes Reason for Co-Treatment: For patient/therapist safety PT goals addressed during session: Mobility/safety with mobility OT goals addressed during session: Strengthening/ROM;ADL's and self-care     End of Session Equipment Utilized During Treatment: Gait belt Activity Tolerance: Patient limited by fatigue Patient left: in bed;with call bell/phone  within reach     Time: 1442-1517 PT Time Calculation (min) (ACUTE ONLY): 35 min  Charges:  $Therapeutic Activity: 8-22 mins                    G Codes:      Franco Duley 10/08/2014, 5:32 PM

## 2014-09-09 NOTE — Progress Notes (Addendum)
Patient Profile: 81 yo woman with PMH of systolic HF (EF 35-40% 01/2014), hypertension, dyslipidemia, GERD and recent hospitalization from 3/9-3/17 for PNA/sepsis requiring mechanical ventilation who had an episode of atrial fibrillation with RVR in the hospital, who was re-admitted 09/02/14 for right hip fracture after a mechanical fall. Also with elevated troponins 1.03-->2.14 -->6.09 and increased O2 requirements.   Op: RT HIP HEMIARTHROPLASTY 09/06/14 POD#3     Subjective: No complaints  Objective: Vital signs in last 24 hours: Temp:  [97.9 F (36.6 C)-98.1 F (36.7 C)] 98 F (36.7 C) (06/08 0511) Pulse Rate:  [70-75] 75 (06/08 0511) Resp:  [20] 20 (06/08 0511) BP: (100-106)/(42-50) 106/49 mmHg (06/08 0511) SpO2:  [100 %] 100 % (06/08 0511) Weight:  [126 lb 12.2 oz (57.5 kg)] 126 lb 12.2 oz (57.5 kg) (06/08 0511) Weight change:  Last BM Date: 09/02/14 Intake/Output from previous day: +390 06/07 0701 - 06/08 0700 In: 600 [P.O.:360; I.V.:240] Out: 250 [Urine:250] Intake/Output this shift:    PE: General:Pleasant affect, NAD Skin:Warm and dry, brisk capillary refill HEENT:normocephalic, sclera clear, mucus membranes moist Heart:S1S2 RRR with 2/6 systolic murmur, no gallup, rub or click Lungs:clear without rales, rhonchi, or wheezes ZOX:WRUE, non tender, + BS, do not palpate liver spleen or masses Ext:no lower ext edema,  2+ radial pulses Neuro:alert and oriented, MAE, follows commands, + facial symmetry Tele:  SR rare PVC Lab Results:  Recent Labs  09/08/14 0420 09/09/14 0445  WBC 6.9 5.2  HGB 10.2* 9.1*  HCT 31.2* 28.1*  PLT 292 283   BMET  Recent Labs  09/08/14 0420 09/09/14 0445  NA 140 138  K 3.6 3.2*  CL 98* 98*  CO2 32 29  GLUCOSE 109* 102*  BUN 29* 36*  CREATININE 0.87 0.90  CALCIUM 8.9 8.6*   No results for input(s): TROPONINI in the last 72 hours.  Invalid input(s): CK, MB  No results found for: CHOL, HDL, LDLCALC, LDLDIRECT,  TRIG, CHOLHDL No results found for: AVWU9W   Lab Results  Component Value Date   TSH 1.000 06/13/2014     Studies/Results: No results found.  Medications: I have reviewed the patient's current medications. Scheduled Meds: . aspirin EC  325 mg Oral Q breakfast  . carvedilol  3.125 mg Oral BID WC  . cholecalciferol  5,000 Units Oral Daily  . folic acid-pyridoxine-cyancobalamin  1 tablet Oral Daily  . furosemide  40 mg Oral BID  . heparin subcutaneous  5,000 Units Subcutaneous 3 times per day  . lisinopril  2.5 mg Oral Daily  . NIFEdipine  60 mg Oral Daily  . omega-3 acid ethyl esters  1 capsule Oral Daily  . simvastatin  20 mg Oral Daily   Continuous Infusions: . sodium chloride Stopped (09/09/14 0734)   PRN Meds:.acetaminophen **OR** acetaminophen, hydrALAZINE, HYDROcodone-acetaminophen, menthol-cetylpyridinium **OR** phenol, methocarbamol **OR** methocarbamol (ROBAXIN)  IV, metoCLOPramide **OR** metoCLOPramide (REGLAN) injection, morphine injection, ondansetron **OR** ondansetron (ZOFRAN) IV  Assessment/Plan: Principal Problem:   Femoral neck fracture Active Problems:   Acute on chronic systolic and diastolic heart failure, NYHA class 3   HTN (hypertension)   Hyperlipidemia   Hypokalemia   Chronic combined systolic and diastolic heart failure, NYHA class 1   Acute respiratory failure with hypoxia   Hip fracture   Elevated troponin   Fall   NSTEMI (non-ST elevated myocardial infarction)   Pressure ulcer  1. NSTEMI Robust elevation in cardiac markers with diffuse TWI on ekg. Currently chest pain free. Agreed with  Dr Swaz[place] that given her advanced age and comoribidites that she is not a candidate for cardiology procedures. Would treat conservatively. Doing well  2. Ischemic CM EF 35-40% Gentle diuresis as able On coreg CHF: systolic and diastolic HF, with EF now as previously EF 35-40% with G2DD. Sats stable on RA. Also moderate MR and mild-mod TR and elevated PA pk  pressure at 56 mmHg has been diuresed -5155 since admit. Now on lasix po 40 mg BID.   3. Hip fx Recurrent history of prior falls.  Given acute on chronic systolic CHF and NSTEMI she was considered high risk for general anesthesia and surgery but has done well. She is not a candidate for invasive ischemic evaluation with cardiac cath.POST OP DAY 3, stable.  4. Hypokalemia will replace   5. Anemia, slow drift down   LOS: 7 days   Time spent with pt. :15 minutes. [facility] HospitalNGOLD,LAURA R  Nurse Practitioner Certified Pager [phone] or after 5pm and on weekends call [phone] 09/09/2014, 9:10 AM   I have examined the patient and reviewed assessment and plan and discussed with patient.  Agree with above as stated.  Not reporting SHOB or chest discomfort at this time.  Continue medical therapy.  No fluid overload at this time. Watch for signs of fluid overload. I encouraged her to try to work with physical therapy to improve her strength. She is not reporting any pain at her hip fracture site at this time.  Continue ACE-I and beta blocker.  If blood pressure drops, could stop nifedipine. Would like to keep ACE inhibitor and beta blocker going due to her LV dysfunction.  [place] Attar S.

## 2014-09-09 NOTE — Progress Notes (Signed)
Occupational Therapy Treatment Patient Details Name: Traci Hale MRN: [id number] DOB: 04/11/1924 Today's Date: 09/09/2014    History of present illness Pt admitted s/p fall with R hip fx with hemi-arthroplasty repair via anterior direct approach   OT comments  Pt able to stand today (long enough to have BP taken).  Follow Up Recommendations  SNF    Equipment Recommendations  3 in 1 bedside comode    Recommendations for Other Services      Precautions / Restrictions Precautions Precautions: Fall Restrictions Other Position/Activity Restrictions: WBAT       Mobility Bed Mobility         Supine to sit: Max assist;+2 for physical assistance Sit to supine: Max assist;+2 for physical assistance   General bed mobility comments: cues for technique.  utilized pad.  Pt was able to assist with scooting to EOB when RLE supported  Transfers   Equipment used: Rolling walker (2 wheeled) Transfers: Sit to/from Stand Sit to Stand: Mod assist;+2 physical assistance;From elevated surface         General transfer comment: cues for UE/LE placement; tactile cues to use LUE to push up from bed    Balance                                   ADL                                         General ADL Comments: worked on bed mobility and standing for ADLs:  orthostatic vitals taken, see flowsheets.  Pt did c/o dizziness after standing:  she could not describe sensation other than dizzy      Vision                     Perception     Praxis      Cognition   Behavior During Therapy: WFL for tasks assessed/performed Overall Cognitive Status: Within Functional Limits for tasks assessed                       Extremity/Trunk Assessment               Exercises     Shoulder Instructions       General Comments      Pertinent Vitals/ Pain       Faces Pain Scale: Hurts even more Pain Location: Rhip Pain Descriptors /  Indicators: Aching Pain Intervention(s): Limited activity within patient's tolerance;Monitored during session;Premedicated before session;Repositioned  Home Living                                          Prior Functioning/Environment              Frequency       Progress Toward Goals  OT Goals(current goals can now be found in the care plan section)  Progress towards OT goals: Progressing toward goals     Plan      Co-evaluation    PT/OT/SLP Co-Evaluation/Treatment: Yes   PT goals addressed during session: Mobility/safety with mobility OT goals addressed during session: Strengthening/ROM;ADL's and self-care      End of Session     Activity Tolerance  Patient tolerated treatment well   Patient Left in bed;with call bell/phone within reach   Nurse Communication Mobility status        Time: [id number] OT Time Calculation (min): 27 min  Charges: OT General Charges $OT Visit: 1 Procedure OT Treatments $Therapeutic Activity: 8-22 mins  Giordano Traci Hale 09/09/2014, 3:29 PM  Traci Hale, OTR/L [phone] 09/09/2014

## 2014-09-09 NOTE — Progress Notes (Signed)
Nutrition Follow-up  DOCUMENTATION CODES:  Severe malnutrition in context of chronic illness  INTERVENTION: - Will order Ensure Enlive po BID, each supplement provides 350 kcal and 20 grams of protein - Recommend downgrade to Dysphagia 2 or 3 for better chewing tolerance for pt with no dentures present - RD will continue to monitor for needs  NUTRITION DIAGNOSIS:  Inadequate oral intake related to inability to eat as evidenced by NPO status. - NPO status ongoing but unable to determine intakes at this time with none documented  GOAL:  Patient will meet greater than or equal to 90% of their needs -likely not met  MONITOR:  Diet advancement, Weight trends, Labs, I & O's  ASSESSMENT: Per H&P, 81 y.o. female with history of chronic systolic heart failure last year measured was 35-40%, hypertension, chronic anemia, dyslipidemia was brought to the ER after patient had a fall at the backyard of her house.   Pt underwent R hip hemiarthroplasty 6/5. Diet advanced from NPO to Regular 6/6 with no intakes documented since that time. RN unsure of intakes. Breakfast tray was ordered shortly before RD visit and pt had not yet received it.   Pt does not have any teeth and she states that her dentures are at home. Talked with her and RN and pt confirms that she needs softer foods due to not have dentures; RN states she will take care of this and assist pt. No SLP notes since admission.  Unable to assess if pt is meeting needs at this time. Medications reviewed. Labs reviewed; BUN elevated, K: 3.2 mmol/L, Cl: 98 mmol/L, CBGs: 83-132 mg/dL.  Height:  Ht Readings from Last 1 Encounters:  09/02/14 $RemoveB'5\' 5"'WwTcKNCs$  (1.651 m)    Weight:  Wt Readings from Last 1 Encounters:  09/09/14 126 lb 12.2 oz (57.5 kg)    Ideal Body Weight:  56.8 kg (kg)  Wt Readings from Last 10 Encounters:  09/09/14 126 lb 12.2 oz (57.5 kg)  06/12/14 129 lb 3 oz (58.6 kg)  01/26/14 117 lb 4 oz (53.184 kg)  01/10/14 108 lb 15.2  oz (49.42 kg)    BMI:  Body mass index is 21.09 kg/(m^2).  Estimated Nutritional Needs:  Kcal:  1150-1350  Protein:  60-70 grams  Fluid:  2-2.2 L/day  Skin:  Wound (see comment) (Stage 2 sacral pressure ulcer; Unstageable ulcer to ankle; R hip surgical incision)  Diet Order:  Diet regular Room service appropriate?: Yes; Fluid consistency:: Thin  EDUCATION NEEDS:  No education needs identified at this time   Intake/Output Summary (Last 24 hours) at 09/09/14 1016 Last data filed at 09/09/14 0700  Gross per 24 hour  Intake    240 ml  Output    250 ml  Net    -10 ml    Last BM:  6/1   Jarome Matin, RD, LDN Inpatient Clinical Dietitian Pager # [phone] After hours/weekend pager # [phone]

## 2014-09-09 NOTE — Clinical Social Work Placement (Signed)
[facility]. Patient & grand-daughter, Skip Estimableicol aware. Discharge packet given to RN, Florentina AddisonKatie. [facility] called for transport.     Lincoln MaxinKelly Omarii Scalzo, LCSW [facility] Clinical Social Worker cell #: [phone]    CLINICAL SOCIAL WORK PLACEMENT  NOTE  Date:  09/09/2014  Patient Details  Name: Traci Hale MRN: [id number] Date of Birth: [birth year]  Clinical Social Work is seeking post-discharge placement for this patient at the Skilled  Nursing Facility level of care (*CSW will initial, date and re-position this form in  chart as items are completed):  Yes   Patient/family provided with [place] Clinical Social Work Department's list of facilities offering this level of care within the geographic area requested by the patient (or if unable, by the patient's family).  Yes   Patient/family informed of their freedom to choose among providers that offer the needed level of care, that participate in Medicare, Medicaid or managed care program needed by the patient, have an available bed and are willing to accept the patient.  Yes   Patient/family informed of [facility]'s ownership interest in [facility] and [facility], as well as of the fact that they are under no obligation to receive care at these facilities.  PASRR submitted to EDS on 09/03/14     PASRR number received on       Existing PASRR number confirmed on 09/03/14     FL2 transmitted to all facilities in geographic area requested by pt/family on 09/03/14     FL2 transmitted to all facilities within larger geographic area on       Patient informed that his/her managed care company has contracts with or will negotiate with certain facilities, including the following:        Yes   Patient/family informed of bed offers received.  [facility]     Physician recommends and patient chooses bed at      [facility].  Patient to be transferred to facility by [facility]     Patient family notified on 09/09/14 of transfer.  Name of family member notified:  patient's grandaughter, Skip EstimableNicol via phone     PHYSICIAN       Additional Comment:    _______________________________________________ Arlyss RepressHarrison, Ercell Razon F, LCSW 09/09/2014, 1:49 PM

## 2014-09-09 NOTE — Discharge Summary (Addendum)
Physician Discharge Summary  Creed CopperOphelia G Handel WUJ:[id number] DOB: 1924-10-07 DOA: 09/02/2014  PCP: Cain SaupeFULP, CAMMIE, MD  Admit date: 09/02/2014 Discharge date: 09/09/2014  Time spent: 20 minutes  Recommendations for Outpatient Follow-up:  1. Follow up with PCP in 1-2 weeks 2. Follow up with Dr. Magnus IvanBlackman as scheduled 3. Please check BMET and CBC within 1-2 weeks  Discharge Diagnoses:  Principal Problem:   Femoral neck fracture Active Problems:   Acute on chronic systolic and diastolic heart failure, NYHA class 3   HTN (hypertension)   Hyperlipidemia   Hypokalemia   Chronic combined systolic and diastolic heart failure, NYHA class 1   Acute respiratory failure with hypoxia   Hip fracture   Elevated troponin   Fall   NSTEMI (non-ST elevated myocardial infarction)   Pressure ulcer Stage II sacral pressure ulcer  Discharge Condition: Improved  Diet recommendation: heart healthy  Filed Weights   09/06/14 0530 09/07/14 0400 09/09/14 0511  Weight: 54.4 kg (119 lb 14.9 oz) 58.8 kg (129 lb 10.1 oz) 57.5 kg (126 lb 12.2 oz)    History of present illness:  Please review H and P from 6/2 for details. Briefly, pt presented s/p fall with resultant R hip fracture noted. The patient was admitted for further work up.  Hospital Course:    Femoral neck fracture/right hip fracture after mechanical fall status post right hip hemiarthroplasty - S/p R hip hemiarthoplasty on 6/5, remained clinically stable since surgery - Appreciated cardiology and orthopedics recommendations  NSTEMI: - Cardiology was consulted for increase in the troponins with peak of 6.09, pt had denied any chest pain - Cardiology following, not a candidate for invasive ischemic evaluation, recommendations to continue to diurese. - 2-D echo EF of 35-40% with diffuse hypokinesis, grade 2 diastolic dysfunction - Remained on Lasix, nifedipine, Coreg, statin.  - Cardiology recs to continue to maximize medical care  Acute  hypoxic respiratory failure with acute on chronic combined systolic and diastolic CHF: Improved, O2 sats 100% on room air - 2-D echo EF of 35-40% with diffuse hypokinesis, grade 2 diastolic dysfunction, no significant change in the EF from the prior echo - Continue Lasix, negative balance of 5.1 L on d/c, transitioned to oral Lasix  - Chest x-ray with possible infiltrates versus edema, patient was placed on IV antibiotics,Pro-calcitonin 0.21, no leukocytosis, afebrile, no URI symptoms or hypoxia, dc'ed IV antibiotics   Uncontrolled hypertension - remained stable, Continue Lasix, lisinopril, Coreg, nifedipine, hydralazine  Chronic anemia - Would closely monitor  Severe malnutrition in context of chronic illness  Stage II sacral pressure ulcer  Procedures:  R hip hemiarthoplasty 6/5  Consultations:  WOC  Cardiology  Orthopedic Surgery  Discharge Exam: Filed Vitals:   09/09/14 0511 09/09/14 0940 09/09/14 1409 09/09/14 1428  BP: 106/49 129/53 98/42 107/44  Pulse: 75 76 63   Temp: 98 F (36.7 C) 98.3 F (36.8 C) 97.4 F (36.3 C)   TempSrc: Oral Oral Oral   Resp: 20 20 16    Height:      Weight: 57.5 kg (126 lb 12.2 oz)     SpO2: 100% 100% 100%     General: Awake, in nad Cardiovascular: regular, s1, s2 Respiratory: normal resp effort, no wheezing  Discharge Instructions      Discharge Instructions    Full weight bearing    Complete by:  As directed   Laterality:  right  Extremity:  Lower            Medication List  STOP taking these medications        amiodarone 200 MG tablet  Commonly known as:  PACERONE     amoxicillin-clavulanate 500-125 MG per tablet  Commonly known as:  AUGMENTIN     aspirin 325 MG tablet  Replaced by:  aspirin 325 MG EC tablet     bisoprolol 5 MG tablet  Commonly known as:  ZEBETA      TAKE these medications        acetaminophen 325 MG tablet  Commonly known as:  TYLENOL  Take 2 tablets (650 mg total) by mouth  every 6 (six) hours as needed for mild pain, moderate pain, fever or headache.     aspirin 325 MG EC tablet  Take 1 tablet (325 mg total) by mouth daily.     CALCIUM 600+D 600-200 MG-UNIT Tabs  Generic drug:  Calcium Carbonate-Vitamin D  Take 1 tablet by mouth 2 (two) times daily.     docusate sodium 100 MG capsule  Commonly known as:  COLACE  Take 1 capsule (100 mg total) by mouth 2 (two) times daily.     feeding supplement (RESOURCE BREEZE) Liqd  Take 1 Container by mouth 3 (three) times daily between meals.     FOLGARD 0.8-10-0.115 MG Tabs  Generic drug:  Folic Acid-Vit B6-Vit B12  Take 1 tablet by mouth daily.     furosemide 40 MG tablet  Commonly known as:  LASIX  Take 1 tablet (40 mg total) by mouth 2 (two) times daily.     HYDROcodone-acetaminophen 5-325 MG per tablet  Commonly known as:  NORCO/VICODIN  Take 1-2 tablets by mouth every 6 (six) hours as needed for moderate pain.     ipratropium-albuterol 0.5-2.5 (3) MG/3ML Soln  Commonly known as:  DUONEB  Take 3 mLs by nebulization every 4 (four) hours as needed.     lisinopril 2.5 MG tablet  Commonly known as:  PRINIVIL,ZESTRIL  Take 1 tablet (2.5 mg total) by mouth daily.     NIFEdipine 60 MG 24 hr tablet  Commonly known as:  PROCARDIA-XL/ADALAT CC  Take 1 tablet (60 mg total) by mouth daily.     OMEGA-3 FUSION Liqd  Take 5 mLs by mouth daily. 1000mg  omega 3 liquid     polyethylene glycol packet  Commonly known as:  MIRALAX / GLYCOLAX  Take 17 g by mouth daily.     PROBIOTIC DAILY PO  Take 1 capsule by mouth daily.     simvastatin 20 MG tablet  Commonly known as:  ZOCOR  Take 20 mg by mouth daily.     Vitamin D-3 5000 UNITS Tabs  Take 1 tablet by mouth daily.       No Known Allergies Follow-up Information    Follow up with Kathryne Hitch, MD.   Specialty:  Orthopedic Surgery   Why:  Doctor's Office will call you and arrange an appt   Contact information:   [address] [place] [address] [place] [address] [phone]       Follow up with FULP, CAMMIE, MD. Nyra Capes on 09/15/2014.   Specialty:  Family Medicine   Why:  at 1:30pm   For Post Hospitalization Follow Up. , Take your Discharge Instruction to your appt   Contact information:   [address] [place] [address] [phone]        The results of significant diagnostics from this hospitalization (including imaging, microbiology, ancillary and laboratory) are listed below for reference.    Significant Diagnostic  Studies: Dg Chest 2 View  09/02/2014   CLINICAL DATA:  Fall today.  Hip fx. Htn. Chf  EXAM: CHEST - 2 VIEW  COMPARISON:  07/22/2014  FINDINGS: New interstitial and airspace opacities involving upper lung zones more than bases, right greater than left. Mild cardiomegaly stable. Tortuous atheromatous aorta. No pneumothorax. No effusion. Visualized skeletal structures are unremarkable.  IMPRESSION: 1. New asymmetric infiltrates or edema as above. 2. Stable mild cardiomegaly   Electronically Signed   By: Corlis Leak M.D.   On: 09/02/2014 19:50   Pelvis Portable  09/06/2014   CLINICAL DATA:  Right hip arthroplasty.  EXAM: PORTABLE PELVIS 1-2 VIEWS  COMPARISON:  06/30/2014, FINDINGS: Right hip total arthroplasty for repair of femoral neck fracture. No fracture dislocation.  IMPRESSION: Right total hip arthroplasty without complication.   Electronically Signed   By: Genevive Bi M.D.   On: 09/06/2014 11:12   Dg Chest Portable 1 View  09/06/2014   CLINICAL DATA:  Preop for hip surgery. Hip fracture. Recent pneumonia.  EXAM: PORTABLE CHEST - 1 VIEW  COMPARISON:  One-view chest x-ray 09/02/2014. Two-view chest x-ray 07/22/2014  FINDINGS: The heart size is normal. Atherosclerotic calcifications are present at the aortic arch. Aeration in the upper lobes has improved. A diffuse interstitial pattern is increased from the baseline. Subtle asymmetric left-sided airspace densities are present. No focal airspace  consolidation is evident.  IMPRESSION: 1. Improved aeration of both lung apices. 2. Atherosclerosis. 3. A diffuse interstitial pattern is slightly increased, suggesting edema superimposed on emphysema. Subtle left-sided airspace disease raises possibility of pneumonia.   Electronically Signed   By: Marin Roberts M.D.   On: 09/06/2014 07:41   Dg Hip Operative Unilat With Pelvis Right  09/06/2014   CLINICAL DATA:  Hip surgery  EXAM: OPERATIVE RIGHT HIP (WITH PELVIS IF PERFORMED) 4 VIEWS  TECHNIQUE: Fluoroscopic spot image(s) were submitted for interpretation post-operatively.  FLUOROSCOPY TIME:  Radiation Exposure Index (as provided by the fluoroscopic device): Not provided  If the device does not provide the exposure index:  Fluoroscopy Time:  0 minutes 9 seconds  Number of Acquired Images:  4  COMPARISON:  09/02/2014  FINDINGS: Orthopedic hardware proximal LEFT femur post ORIF.  New RIGHT hip replacement with prosthetic components in expected positions.  No fracture or dislocation.  Diffuse osseous demineralization.  Visualized pelvis intact.  IMPRESSION: Post RIGHT hip replacement for RIGHT femoral neck fracture.  No acute complication.  Osseous demineralization.   Electronically Signed   By: Ulyses Southward M.D.   On: 09/06/2014 10:50   Dg Hip Unilat  With Pelvis 2-3 Views Right  09/02/2014   CLINICAL DATA:  Pain post fall at home today  EXAM: RIGHT HIP (WITH PELVIS) 2-3 VIEWS  COMPARISON:  06/10/2014  FINDINGS: Midcervical fracture of the right femur, impacted with some displacement and foreshortening. No dislocation. IM rod and sliding pin fixation hardware across the left femoral neck as before. Bony pelvis intact. Degenerative disc disease in the visualized lower lumbar spine.  IMPRESSION: 1. Displaced midcervical right femur fracture fracture.   Electronically Signed   By: Corlis Leak M.D.   On: 09/02/2014 17:30   Dg Femur, Min 2 Views Right  09/02/2014   CLINICAL DATA:  Proximal RIGHT femur fracture.   EXAM: RIGHT FEMUR 2 VIEWS  COMPARISON:  None.  FINDINGS: Distal femur is intact. Tricompartmental osteoarthritis of the knee is incidentally noted.  IMPRESSION: Intact distal femur.   Electronically Signed   By:  Andreas Newport M.D.   On: 09/02/2014 20:17    Microbiology: Recent Results (from the past 240 hour(s))  Surgical pcr screen     Status: None   Collection Time: 09/06/14 12:34 AM  Result Value Ref Range Status   MRSA, PCR NEGATIVE NEGATIVE Final   Staphylococcus aureus NEGATIVE NEGATIVE Final    Comment:        The Xpert SA Assay (FDA approved for NASAL specimens in patients over 15 years of age), is one component of a comprehensive surveillance program.  Test performance has been validated by [facility] for patients greater than or equal to 24 year old. It is not intended to diagnose infection nor to guide or monitor treatment.      Labs: Basic Metabolic Panel:  Recent Labs Lab 09/05/14 0510 09/06/14 0445 09/07/14 0705 09/08/14 0420 09/09/14 0445  NA 137 139 140 140 138  K 3.6 3.5 3.6 3.6 3.2*  CL 98* 99* 99* 98* 98*  CO2 32 29  GLUCOSE 90 105* 92 109* 102*  BUN 29* 30* 23* 29* 36*  CREATININE 0.92 1.05* 0.70 0.87 0.90  CALCIUM 8.8* 8.7* 8.8* 8.9 8.6*   Liver Function Tests:  Recent Labs Lab 09/03/14 0156  AST 33  ALT 12*  ALKPHOS 62  BILITOT 0.4  PROT 8.3*  ALBUMIN 3.4*   No results for input(s): LIPASE, AMYLASE in the last 168 hours. No results for input(s): AMMONIA in the last 168 hours. CBC:  Recent Labs Lab 09/02/14 1835 09/03/14 0156 09/05/14 0510 09/06/14 0445 09/07/14 0705 09/08/14 0420 09/09/14 0445  WBC 5.6 5.5 6.1 5.3 7.2 6.9 5.2  NEUTROABS 4.9 4.3  --   --   --   --   --   HGB 11.0* 11.8* 10.9* 11.3* 10.3* 10.2* 9.1*  HCT 34.9* 36.6 34.1* 35.7* 32.0* 31.2* 28.1*  MCV 105.1* 105.2* 102.7* 104.1* 103.6* 105.1* 103.7*  PLT 369 386 274 295 300 292 283   Cardiac Enzymes:  Recent Labs Lab 09/02/14 2311  09/03/14 0156 09/03/14 0650 09/03/14 1233  TROPONINI 1.03* 2.14* 5.87* 6.09*   BNP: BNP (last 3 results)  Recent Labs  09/02/14 1835  BNP 1502.4*    ProBNP (last 3 results)  Recent Labs  01/08/14 1401 01/09/14 0040  PROBNP 18570.0* 17819.0*    CBG:  Recent Labs Lab 09/08/14 1207 09/08/14 1740 09/08/14 2344 09/09/14 0656 09/09/14 1136  GLUCAP 148* 95 95 83 112*    Signed:  Clairessa Boulet K  [facility] 09/09/2014, 3:15 PM

## 2014-09-09 NOTE — Progress Notes (Signed)
Money and Jennet Madurojewlrey was sent with Nelma RothmanDetra, granddaughter to take home.  Pt aware.

## 2014-09-09 NOTE — Progress Notes (Signed)
[facility] called for transport to pickup at 4:00pm.      Lincoln MaxinKelly Omolara Carol, LCSW [facility] Clinical Social Worker cell #: [phone]

## 2015-01-08 ENCOUNTER — Encounter (HOSPITAL_COMMUNITY): Payer: Self-pay

## 2015-01-08 ENCOUNTER — Emergency Department (HOSPITAL_COMMUNITY): Payer: Commercial Managed Care - HMO

## 2015-01-08 ENCOUNTER — Inpatient Hospital Stay (HOSPITAL_COMMUNITY): Payer: Commercial Managed Care - HMO

## 2015-01-08 ENCOUNTER — Inpatient Hospital Stay (HOSPITAL_COMMUNITY)
Admission: EM | Admit: 2015-01-08 | Discharge: 2015-01-12 | DRG: 871 | Disposition: A | Discharge status: Home Health | Payer: Commercial Managed Care - HMO | Attending: Internal Medicine | Admitting: Internal Medicine

## 2015-01-08 DIAGNOSIS — Z66 Do not resuscitate: Secondary | ICD-10-CM | POA: Diagnosis present

## 2015-01-08 DIAGNOSIS — Z87891 Personal history of nicotine dependence: Secondary | ICD-10-CM

## 2015-01-08 DIAGNOSIS — D72819 Decreased white blood cell count, unspecified: Secondary | ICD-10-CM | POA: Diagnosis present

## 2015-01-08 DIAGNOSIS — I5042 Chronic combined systolic (congestive) and diastolic (congestive) heart failure: Secondary | ICD-10-CM | POA: Diagnosis not present

## 2015-01-08 DIAGNOSIS — E860 Dehydration: Secondary | ICD-10-CM | POA: Diagnosis present

## 2015-01-08 DIAGNOSIS — E43 Unspecified severe protein-calorie malnutrition: Secondary | ICD-10-CM | POA: Diagnosis present

## 2015-01-08 DIAGNOSIS — E785 Hyperlipidemia, unspecified: Secondary | ICD-10-CM | POA: Diagnosis present

## 2015-01-08 DIAGNOSIS — I252 Old myocardial infarction: Secondary | ICD-10-CM | POA: Diagnosis not present

## 2015-01-08 DIAGNOSIS — I110 Hypertensive heart disease with heart failure: Secondary | ICD-10-CM | POA: Diagnosis present

## 2015-01-08 DIAGNOSIS — Z96641 Presence of right artificial hip joint: Secondary | ICD-10-CM | POA: Diagnosis present

## 2015-01-08 DIAGNOSIS — J441 Chronic obstructive pulmonary disease with (acute) exacerbation: Secondary | ICD-10-CM | POA: Diagnosis present

## 2015-01-08 DIAGNOSIS — I10 Essential (primary) hypertension: Secondary | ICD-10-CM | POA: Diagnosis present

## 2015-01-08 DIAGNOSIS — R778 Other specified abnormalities of plasma proteins: Secondary | ICD-10-CM | POA: Diagnosis present

## 2015-01-08 DIAGNOSIS — K219 Gastro-esophageal reflux disease without esophagitis: Secondary | ICD-10-CM | POA: Diagnosis present

## 2015-01-08 DIAGNOSIS — J189 Pneumonia, unspecified organism: Secondary | ICD-10-CM | POA: Diagnosis present

## 2015-01-08 DIAGNOSIS — I248 Other forms of acute ischemic heart disease: Secondary | ICD-10-CM | POA: Diagnosis present

## 2015-01-08 DIAGNOSIS — E876 Hypokalemia: Secondary | ICD-10-CM | POA: Diagnosis present

## 2015-01-08 DIAGNOSIS — Z8249 Family history of ischemic heart disease and other diseases of the circulatory system: Secondary | ICD-10-CM | POA: Diagnosis not present

## 2015-01-08 DIAGNOSIS — A419 Sepsis, unspecified organism: Principal | ICD-10-CM | POA: Diagnosis present

## 2015-01-08 DIAGNOSIS — G92 Toxic encephalopathy: Secondary | ICD-10-CM | POA: Diagnosis present

## 2015-01-08 DIAGNOSIS — N179 Acute kidney failure, unspecified: Secondary | ICD-10-CM | POA: Diagnosis present

## 2015-01-08 DIAGNOSIS — Y95 Nosocomial condition: Secondary | ICD-10-CM | POA: Diagnosis present

## 2015-01-08 DIAGNOSIS — J9621 Acute and chronic respiratory failure with hypoxia: Secondary | ICD-10-CM | POA: Diagnosis present

## 2015-01-08 DIAGNOSIS — R0602 Shortness of breath: Secondary | ICD-10-CM

## 2015-01-08 DIAGNOSIS — G9340 Encephalopathy, unspecified: Secondary | ICD-10-CM

## 2015-01-08 DIAGNOSIS — Z803 Family history of malignant neoplasm of breast: Secondary | ICD-10-CM | POA: Diagnosis not present

## 2015-01-08 DIAGNOSIS — R7989 Other specified abnormal findings of blood chemistry: Secondary | ICD-10-CM

## 2015-01-08 DIAGNOSIS — I2510 Atherosclerotic heart disease of native coronary artery without angina pectoris: Secondary | ICD-10-CM | POA: Diagnosis present

## 2015-01-08 DIAGNOSIS — E870 Hyperosmolality and hypernatremia: Secondary | ICD-10-CM | POA: Diagnosis present

## 2015-01-08 DIAGNOSIS — D709 Neutropenia, unspecified: Secondary | ICD-10-CM | POA: Diagnosis present

## 2015-01-08 LAB — I-STAT CHEM 8, ED
BUN: 46 mg/dL — AB (ref 6–20)
CALCIUM ION: 1.09 mmol/L — AB (ref 1.13–1.30)
CHLORIDE: 112 mmol/L — AB (ref 101–111)
Creatinine, Ser: 1.4 mg/dL — ABNORMAL HIGH (ref 0.44–1.00)
GLUCOSE: 147 mg/dL — AB (ref 65–99)
HCT: 37 % (ref 36.0–46.0)
Hemoglobin: 12.6 g/dL (ref 12.0–15.0)
Potassium: 3.6 mmol/L (ref 3.5–5.1)
Sodium: 148 mmol/L — ABNORMAL HIGH (ref 135–145)
TCO2: 18 mmol/L (ref 0–100)

## 2015-01-08 LAB — I-STAT CG4 LACTIC ACID, ED: LACTIC ACID, VENOUS: 4.45 mmol/L — AB (ref 0.5–2.0)

## 2015-01-08 LAB — COMPREHENSIVE METABOLIC PANEL
ALK PHOS: 76 U/L (ref 38–126)
ALT: 47 U/L (ref 14–54)
AST: 58 U/L — AB (ref 15–41)
Albumin: 3.4 g/dL — ABNORMAL LOW (ref 3.5–5.0)
Anion gap: 16 — ABNORMAL HIGH (ref 5–15)
BILIRUBIN TOTAL: 0.7 mg/dL (ref 0.3–1.2)
BUN: 49 mg/dL — AB (ref 6–20)
CALCIUM: 9.3 mg/dL (ref 8.9–10.3)
CO2: 18 mmol/L — ABNORMAL LOW (ref 22–32)
CREATININE: 1.5 mg/dL — AB (ref 0.44–1.00)
Chloride: 113 mmol/L — ABNORMAL HIGH (ref 101–111)
GFR calc Af Amer: 35 mL/min — ABNORMAL LOW (ref >60)
GFR, EST NON AFRICAN AMERICAN: 30 mL/min — AB (ref >60)
GLUCOSE: 149 mg/dL — AB (ref 65–99)
POTASSIUM: 3.7 mmol/L (ref 3.5–5.1)
Sodium: 147 mmol/L — ABNORMAL HIGH (ref 135–145)
Total Protein: 7.6 g/dL (ref 6.5–8.1)

## 2015-01-08 LAB — URINE MICROSCOPIC-ADD ON

## 2015-01-08 LAB — CBC WITH DIFFERENTIAL/PLATELET
BASOS ABS: 0 10*3/uL (ref 0.0–0.1)
Basophils Relative: 0 %
EOS ABS: 0 10*3/uL (ref 0.0–0.7)
EOS PCT: 0 %
HCT: 32.1 % — ABNORMAL LOW (ref 36.0–46.0)
Hemoglobin: 10.3 g/dL — ABNORMAL LOW (ref 12.0–15.0)
Lymphocytes Relative: 11 %
Lymphs Abs: 0.3 10*3/uL — ABNORMAL LOW (ref 0.7–4.0)
MCH: 32.4 pg (ref 26.0–34.0)
MCHC: 32.1 g/dL (ref 30.0–36.0)
MCV: 100.9 fL — ABNORMAL HIGH (ref 78.0–100.0)
Monocytes Absolute: 0.3 10*3/uL (ref 0.1–1.0)
Monocytes Relative: 8 %
Neutro Abs: 2.6 10*3/uL (ref 1.7–7.7)
Neutrophils Relative %: 81 %
PLATELETS: 263 10*3/uL (ref 150–400)
RBC: 3.18 MIL/uL — ABNORMAL LOW (ref 3.87–5.11)
RDW: 15.9 % — AB (ref 11.5–15.5)
WBC: 3.3 10*3/uL — AB (ref 4.0–10.5)

## 2015-01-08 LAB — URINALYSIS, ROUTINE W REFLEX MICROSCOPIC
BILIRUBIN URINE: NEGATIVE
Glucose, UA: NEGATIVE mg/dL
KETONES UR: NEGATIVE mg/dL
NITRITE: NEGATIVE
PH: 5.5 (ref 5.0–8.0)
PROTEIN: 100 mg/dL — AB
Specific Gravity, Urine: >1.03 — ABNORMAL HIGH (ref 1.005–1.030)
Urobilinogen, UA: 0.2 mg/dL (ref 0.0–1.0)

## 2015-01-08 LAB — I-STAT TROPONIN, ED: TROPONIN I, POC: 0.15 ng/mL — AB (ref 0.00–0.08)

## 2015-01-08 LAB — I-STAT VENOUS BLOOD GAS, ED
Acid-base deficit: 1 mmol/L (ref 0.0–2.0)
Bicarbonate: 21.4 mEq/L (ref 20.0–24.0)
O2 SAT: 95 %
PO2 VEN: 67 mmHg — AB (ref 30.0–45.0)
TCO2: 22 mmol/L (ref 0–100)
pCO2, Ven: 28.7 mmHg — ABNORMAL LOW (ref 45.0–50.0)
pH, Ven: 7.481 — ABNORMAL HIGH (ref 7.250–7.300)

## 2015-01-08 LAB — CREATININE, URINE, RANDOM: Creatinine, Urine: 153.1 mg/dL

## 2015-01-08 LAB — STREP PNEUMONIAE URINARY ANTIGEN: Strep Pneumo Urinary Antigen: NEGATIVE

## 2015-01-08 MED ORDER — NIFEDIPINE ER 60 MG PO TB24
60.0000 mg | ORAL_TABLET | Freq: Every day | ORAL | Status: DC
  Administered 2015-01-09 – 2015-01-12 (×4): 60 mg via ORAL
  Filled 2015-01-08 (×4): qty 1

## 2015-01-08 MED ORDER — DM-GUAIFENESIN ER 30-600 MG PO TB12
1.0000 | ORAL_TABLET | Freq: Two times a day (BID) | ORAL | Status: DC
  Administered 2015-01-09 – 2015-01-12 (×6): 1 via ORAL
  Filled 2015-01-08 (×7): qty 1

## 2015-01-08 MED ORDER — CALCIUM CARBONATE-VITAMIN D 600-200 MG-UNIT PO TABS: 1.0000 | ORAL_TABLET | Freq: Two times a day (BID) | ORAL | Status: DC

## 2015-01-08 MED ORDER — ENOXAPARIN SODIUM 60 MG/0.6ML ~~LOC~~ SOLN
1.0000 mg/kg | SUBCUTANEOUS | Status: DC
  Administered 2015-01-08: 55 mg via SUBCUTANEOUS
  Filled 2015-01-08: qty 0.6

## 2015-01-08 MED ORDER — VITAMIN D-3 125 MCG (5000 UT) PO TABS: 1.0000 | ORAL_TABLET | Freq: Every day | ORAL | Status: DC

## 2015-01-08 MED ORDER — VANCOMYCIN HCL IN DEXTROSE 750-5 MG/150ML-% IV SOLN
750.0000 mg | INTRAVENOUS | Status: DC
  Administered 2015-01-08: 750 mg via INTRAVENOUS
  Filled 2015-01-08 (×2): qty 150

## 2015-01-08 MED ORDER — PIPERACILLIN-TAZOBACTAM 3.375 G IVPB 30 MIN: 3.3750 g | Freq: Three times a day (TID) | INTRAVENOUS | Status: DC

## 2015-01-08 MED ORDER — PIPERACILLIN-TAZOBACTAM 3.375 G IVPB
3.3750 g | Freq: Three times a day (TID) | INTRAVENOUS | Status: DC
  Administered 2015-01-09 – 2015-01-11 (×7): 3.375 g via INTRAVENOUS
  Filled 2015-01-08 (×12): qty 50

## 2015-01-08 MED ORDER — SODIUM CHLORIDE 0.9 % IV SOLN: INTRAVENOUS | Status: DC

## 2015-01-08 MED ORDER — DOCUSATE SODIUM 100 MG PO CAPS
100.0000 mg | ORAL_CAPSULE | Freq: Two times a day (BID) | ORAL | Status: DC
  Administered 2015-01-09 – 2015-01-12 (×7): 100 mg via ORAL
  Filled 2015-01-08 (×8): qty 1

## 2015-01-08 MED ORDER — SODIUM CHLORIDE 0.9 % IV BOLUS (SEPSIS)
1000.0000 mL | INTRAVENOUS | Status: AC
  Administered 2015-01-08: 1000 mL via INTRAVENOUS

## 2015-01-08 MED ORDER — PIPERACILLIN-TAZOBACTAM 3.375 G IVPB 30 MIN
3.3750 g | Freq: Three times a day (TID) | INTRAVENOUS | Status: DC
Start: 2015-01-08 — End: 2015-01-09
  Filled 2015-01-08 (×2): qty 50

## 2015-01-08 MED ORDER — ALBUTEROL SULFATE (2.5 MG/3ML) 0.083% IN NEBU
2.5000 mg | INHALATION_SOLUTION | RESPIRATORY_TRACT | Status: DC | PRN
  Filled 2015-01-08: qty 3

## 2015-01-08 MED ORDER — POLYETHYLENE GLYCOL 3350 17 G PO PACK
17.0000 g | PACK | Freq: Every day | ORAL | Status: DC | PRN
  Filled 2015-01-08: qty 1

## 2015-01-08 MED ORDER — CARVEDILOL 3.125 MG PO TABS
3.1250 mg | ORAL_TABLET | Freq: Two times a day (BID) | ORAL | Status: DC
  Administered 2015-01-09 – 2015-01-12 (×7): 3.125 mg via ORAL
  Filled 2015-01-08 (×8): qty 1

## 2015-01-08 MED ORDER — PROBIOTIC DAILY PO CAPS: 1.0000 | ORAL_CAPSULE | Freq: Every day | ORAL | Status: DC

## 2015-01-08 MED ORDER — HYDRALAZINE HCL 20 MG/ML IJ SOLN: 5.0000 mg | INTRAMUSCULAR | Status: DC | PRN

## 2015-01-08 MED ORDER — IPRATROPIUM-ALBUTEROL 0.5-2.5 (3) MG/3ML IN SOLN
3.0000 mL | RESPIRATORY_TRACT | Status: DC
  Administered 2015-01-09 – 2015-01-10 (×6): 3 mL via RESPIRATORY_TRACT
  Filled 2015-01-08 (×8): qty 3

## 2015-01-08 MED ORDER — BOOST / RESOURCE BREEZE PO LIQD: 1.0000 | Freq: Three times a day (TID) | ORAL | Status: DC

## 2015-01-08 MED ORDER — HYDROCODONE-ACETAMINOPHEN 5-325 MG PO TABS: 1.0000 | ORAL_TABLET | Freq: Four times a day (QID) | ORAL | Status: DC | PRN

## 2015-01-08 MED ORDER — FOLIC ACID-VIT B6-VIT B12 0.8-10-0.115 MG PO TABS: 1.0000 | ORAL_TABLET | Freq: Every day | ORAL | Status: DC

## 2015-01-08 MED ORDER — ASPIRIN EC 325 MG PO TBEC
325.0000 mg | DELAYED_RELEASE_TABLET | Freq: Every day | ORAL | Status: DC
  Administered 2015-01-09 – 2015-01-12 (×5): 325 mg via ORAL
  Filled 2015-01-08 (×5): qty 1

## 2015-01-08 MED ORDER — OMEGA-3 FUSION PO LIQD
5.0000 mL | Freq: Every day | ORAL | Status: DC
Start: 2015-01-08 — End: 2015-01-09

## 2015-01-08 MED ORDER — SIMVASTATIN 20 MG PO TABS
20.0000 mg | ORAL_TABLET | Freq: Every day | ORAL | Status: DC
  Administered 2015-01-09 – 2015-01-12 (×4): 20 mg via ORAL
  Filled 2015-01-08 (×4): qty 1

## 2015-01-08 MED ORDER — SODIUM CHLORIDE 0.9 % IV BOLUS (SEPSIS)
1500.0000 mL | Freq: Once | INTRAVENOUS | Status: AC
  Administered 2015-01-08: 1500 mL via INTRAVENOUS

## 2015-01-08 MED ORDER — DEXTROSE-NACL 5-0.45 % IV SOLN
INTRAVENOUS | Status: DC
  Administered 2015-01-09 (×2): via INTRAVENOUS

## 2015-01-08 MED ORDER — PIPERACILLIN-TAZOBACTAM 3.375 G IVPB 30 MIN
3.3750 g | INTRAVENOUS | Status: AC
  Administered 2015-01-08: 3.375 g via INTRAVENOUS
  Filled 2015-01-08: qty 50

## 2015-01-08 MED ORDER — PANTOPRAZOLE SODIUM 40 MG PO TBEC
40.0000 mg | DELAYED_RELEASE_TABLET | Freq: Every day | ORAL | Status: DC
  Administered 2015-01-09 – 2015-01-12 (×4): 40 mg via ORAL
  Filled 2015-01-08 (×4): qty 1

## 2015-01-08 NOTE — Progress Notes (Addendum)
ANTIBIOTIC CONSULT NOTE - INITIAL  Pharmacy Consult for Vancomycin and Lovenox Indication: rule out pneumonia and suspected PE  No Known Allergies  Patient Measurements:   Adjusted Body Weight:   Vital Signs: Temp: 98.2 F (36.8 C) (10/07 1803) Temp Source: Rectal (10/07 1803) BP: 156/113 mmHg (10/07 1746) Pulse Rate: 92 (10/07 1746) Intake/Output from previous day:   Intake/Output from this shift:    Labs:  Recent Labs  01/08/15 1817 01/08/15 1834  WBC 3.3*  --   HGB 10.3* 12.6  PLT 263  --   CREATININE  --  1.40*   CrCl cannot be calculated (Unknown ideal weight.). No results for input(s): VANCOTROUGH, VANCOPEAK, VANCORANDOM, GENTTROUGH, GENTPEAK, GENTRANDOM, TOBRATROUGH, TOBRAPEAK, TOBRARND, AMIKACINPEAK, AMIKACINTROU, AMIKACIN in the last 72 hours.   Microbiology: No results found for this or any previous visit (from the past 720 hour(s)).  Medical History: Past Medical History  Diagnosis Date  . Hypertension   . CHF (congestive heart failure) (HCC)   . Hyperlipidemia   . Heart murmur   . Shortness of breath   . GERD (gastroesophageal reflux disease)     Medications:   (Not in a hospital admission) Scheduled:   Infusions:  . piperacillin-tazobactam    . sodium chloride     Assessment: 81yo female with history of HTN, CHF, HLD and GERD presents with non-productive cough, confusion, tachypnea with episodes of apnea. Pharmacy is consulted to dose vancomycin for suspected HCAP. Pt is afebrile, WBC 3.3, sCr 1.4.   Goal of Therapy:  Vancomycin trough level 15-20 mcg/ml  Plan:  Vancomycin  IV q24h Zosyn 3.375g IV q8h Measure antibiotic drug levels at steady state Follow up culture results, renal function and clinical course  Arlean Hopping. Newman Pies, PharmD Clinical Pharmacist Pager [phone] 01/08/2015,6:45 PM   ADDN: Pharmacy is consulted to dose lovenox for suspected PE treatment.   Plan: Lovenox /kg subcutaneously q24h Monitor s/sx of  bleeding  Arlean Hopping. Newman Pies, PharmD Clinical Pharmacist Pager [phone]

## 2015-01-08 NOTE — H&P (Addendum)
[facility] History and Physical  Traci LEVITZ Hale:[id number] DOB: 1924-11-10 DOA: 01/08/2015  Referring physician: ED physician PCP: Cain Saupe, MD  Specialists:   Chief Complaint: Dry cough, shortness of breath, generalized weakness, altered mental status  HPI: Traci Hale is a 81 y.o. female with PMH of hypertension, hyperlipidemia, GERD, chronic systolic and diastolic congestive heart failure, chronic shortness of breath, CAD, who presents with a dry cough, shortness of breath, generalized weakness, and altered mental status.  Patient has AMS and is unable to provide accurate medical history, therefore, most of the history is obtained by discussing the case with ED physician and 2 granddaughters. It seems that patient has been having confused in past 2 days, she has dry cough, and worsening shortness of breath, generalized weakness. Patient was noted to have apnea for several times in the emergency room, each time lasted for a few seconds. She also has decreased oral intake, no fever, chills, chest pain. Patient had one episode of abdominal pain in the emergency room, which has resolved after passed gas. Currently patient does not have abdominal pain, nausea, vomiting, diarrhea, symptoms of UTI she moves all extremities, no vision change or hearing loss.  Of note,  patient was hospitalized from 6/1-6/8 because of right hip fracture and elevated troponin up to 6.09. Patient had R hip hemiarthoplasty on 6/5. Cardiology was consulted in hospital, and recommended to maximize to medical treatment since patient was not a candidate for invasive procedure. She was discharged to skilled nursing home for rehab and went home two months ago.   In ED, patient was found to have elevated troponin 0.15, elevated lactate 4.45, temperature normal, no tachycardia, neutropenia with WBC 3.3, stable hemoglobin, hypernatremia with sodium of 148, AKI with cre 1.40. CT head is negative for acute  abnormalities. CXR showed right base consolidation.  Where does patient live?   At home  Can patient participate in ADLs?  None  Review of Systems: Could not be assessed due to altered mental status   Allergy: No Known Allergies  Past Medical History  Diagnosis Date  . Hypertension   . CHF (congestive heart failure) (HCC)   . Hyperlipidemia   . Heart murmur   . Shortness of breath   . GERD (gastroesophageal reflux disease)     Past Surgical History  Procedure Laterality Date  . Leg surgery    . Total hip arthroplasty Right 09/06/2014    Procedure: HEMI HIP ARTHROPLASTY ANTERIOR APPROACH;  Surgeon: Kathryne Hitch, MD;  Location: [facility];  Service: Orthopedics;  Laterality: Right;    Social History:  reports that she has quit smoking. She has never used smokeless tobacco. She reports that she does not drink alcohol or use illicit drugs.  Family History:  Family History  Problem Relation Age of Onset  . Hypertension Father   . Breast cancer Daughter      Prior to Admission medications   Medication Sig Start Date End Date Taking? Authorizing Provider  acetaminophen (TYLENOL) 325 MG tablet Take 2 tablets (650 mg total) by mouth every 6 (six) hours as needed for mild pain, moderate pain, fever or headache. Patient not taking: Reported on 09/02/2014 06/18/14   Joseph Art, DO  aspirin EC 325 MG EC tablet Take 1 tablet (325 mg total) by mouth daily. 09/08/14   Kathryne Hitch, MD  Calcium Carbonate-Vitamin D (CALCIUM 600+D) 600-200 MG-UNIT TABS Take 1 tablet by mouth 2 (two) times daily.    Historical Provider, MD  Cholecalciferol (VITAMIN D-3) 5000 UNITS TABS Take 1 tablet by mouth daily.    Historical Provider, MD  docusate sodium (COLACE) 100 MG capsule Take 1 capsule (100 mg total) by mouth 2 (two) times daily. 09/09/14   Jerald Kief, MD  feeding supplement, RESOURCE BREEZE, (RESOURCE BREEZE) LIQD Take 1 Container by mouth 3 (three) times daily between  meals. Patient not taking: Reported on 09/02/2014 06/18/14   Joseph Art, DO  Folic Acid-Vit B6-Vit B12 (FOLGARD) 0.8-10-0.115 MG TABS Take 1 tablet by mouth daily.    Historical Provider, MD  furosemide (LASIX) 40 MG tablet Take 1 tablet (40 mg total) by mouth 2 (two) times daily. 09/09/14   Jerald Kief, MD  HYDROcodone-acetaminophen (NORCO/VICODIN) 5-325 MG per tablet Take 1-2 tablets by mouth every 6 (six) hours as needed for moderate pain. 09/08/14   Kathryne Hitch, MD  ipratropium-albuterol (DUONEB) 0.5-2.5 (3) MG/3ML SOLN Take 3 mLs by nebulization every 4 (four) hours as needed. Patient not taking: Reported on 09/02/2014 06/18/14   Joseph Art, DO  lisinopril (PRINIVIL,ZESTRIL) 2.5 MG tablet Take 1 tablet (2.5 mg total) by mouth daily. 09/09/14   Jerald Kief, MD  NIFEdipine (PROCARDIA-XL/ADALAT CC) 60 MG 24 hr tablet Take 1 tablet (60 mg total) by mouth daily. 09/09/14   Jerald Kief, MD  Omega 3-6-9 Fatty Acids (OMEGA-3 FUSION) LIQD Take 5 mLs by mouth daily.  omega 3 liquid    Historical Provider, MD  polyethylene glycol (MIRALAX / GLYCOLAX) packet Take 17 g by mouth daily. 09/09/14   Jerald Kief, MD  Probiotic Product (PROBIOTIC DAILY PO) Take 1 capsule by mouth daily.    Historical Provider, MD  simvastatin (ZOCOR) 20 MG tablet Take 20 mg by mouth daily.    Historical Provider, MD    Physical Exam: Filed Vitals:   01/08/15 1900 01/08/15 1920 01/08/15 1930 01/08/15 2000  BP: 154/95  157/100 146/88  Pulse: 98  100 96  Temp:      TempSrc:      Resp: 14  18   Height:   (1.575 m)    Weight:  54.432 kg (120 lb)    SpO2: 100%  99% 98%   General: Not in acute distress. Dry mucus and membrane HEENT:       Eyes: PERRL, EOMI, no scleral icterus.       ENT: No discharge from the ears and nose, no pharynx injection, no tonsillar enlargement.        Neck: No JVD, no bruit, no mass felt. Heme: No neck lymph node enlargement. Cardiac: S1/S2, RRR, No murmurs, No gallops  or rubs. Pulm: No rales, wheezing, rhonchi or rubs. Abd: Soft, nondistended, nontender, no rebound pain, no organomegaly, BS present. Ext: No pitting leg edema bilaterally. 2+DP/PT pulse bilaterally. Musculoskeletal: No joint deformities, No joint redness or warmth, no limitation of ROM in spin. Skin: No rashes.  Neuro: Alert, not oriented X3, cranial nerves II-XII grossly intact, moves all extremities Brachial reflex 1+ bilaterally. Knee reflex 1+ bilaterally. Negative Babinski's sign. Psych: Patient is not psychotic, no suicidal or hemocidal ideation.  Labs on Admission:  Basic Metabolic Panel:  Recent Labs Lab 01/08/15 1817 01/08/15 1834  NA 147* 148*  K 3.7 3.6  CL 113* 112*  CO2 18*  --   GLUCOSE 149* 147*  BUN 49* 46*  CREATININE 1.50* 1.40*  CALCIUM 9.3  --    Liver Function Tests:  Recent Labs Lab 01/08/15 1817  AST  58*  ALT 47  ALKPHOS 76  BILITOT 0.7  PROT 7.6  ALBUMIN 3.4*   No results for input(s): LIPASE, AMYLASE in the last 168 hours. No results for input(s): AMMONIA in the last 168 hours. CBC:  Recent Labs Lab 01/08/15 1817 01/08/15 1834  WBC 3.3*  --   NEUTROABS 2.6  --   HGB 10.3* 12.6  HCT 32.1* 37.0  MCV 100.9*  --   PLT 263  --    Cardiac Enzymes: No results for input(s): CKTOTAL, CKMB, CKMBINDEX, TROPONINI in the last 168 hours.  BNP (last 3 results)  Recent Labs  09/02/14 1835  BNP 1502.4*    ProBNP (last 3 results)  Recent Labs  01/09/14 0040  PROBNP 17819.0*    CBG: No results for input(s): GLUCAP in the last 168 hours.  Radiological Exams on Admission: Ct Head Wo Contrast  01/08/2015   CLINICAL DATA:  Mental status changes.  Confusion.  Weakness.  EXAM: CT HEAD WITHOUT CONTRAST  TECHNIQUE: Contiguous axial images were obtained from the base of the skull through the vertex without intravenous contrast.  COMPARISON:  06/10/2014  FINDINGS: The brain shows generalized atrophy. There chronic small-vessel ischemic changes  throughout the white matter. No sign of acute infarction, mass lesion, hemorrhage, hydrocephalus or extra-axial collection. The calvarium is unremarkable. Sinuses are clear. There is atherosclerotic calcification of the major vessels at the base of the brain.  IMPRESSION: Atrophy and chronic small vessel disease. No acute or reversible finding.   Electronically Signed   By: Paulina Fusi M.D.   On: 01/08/2015 20:42   Dg Chest Port 1 View  01/08/2015   CLINICAL DATA:  Nonproductive cough, onset today. Lethargy, labored breathing and confusion.  EXAM: PORTABLE CHEST 1 VIEW  COMPARISON:  09/06/2014  FINDINGS: There is new right base consolidation which could represent pneumonia. The left lung is clear. No large effusion is evident. Pulmonary vasculature is normal.  IMPRESSION: Right base consolidation, suspicious for pneumonia.   Electronically Signed   By: Ellery Plunk M.D.   On: 01/08/2015 18:37    EKG: Independently reviewed.  Abnormal findings: QTC 487, LAE, early R-wave progression, LVH, occasional PVC  Assessment/Plan Principal Problem:   HCAP (healthcare-associated pneumonia) Active Problems:   HTN (hypertension)   Hyperlipidemia   Sepsis (HCC)   Chronic combined systolic and diastolic heart failure, NYHA class 1 (HCC)   Leukopenia   Acute on chronic respiratory failure with hypoxia (HCC)   Protein-calorie malnutrition, severe (HCC)   CAP (community acquired pneumonia)   Elevated troponin   GERD (gastroesophageal reflux disease)   Acute encephalopathy   AKI (acute kidney injury) (HCC)   Hypernatremia  Acute on chronic respiratory failure with hypoxia (HCC) and possible HCAP: Patient's worsening shortness of breath and cough are likely caused by possible PNA as evidenced by chest x-ray. Since patient was in nursing home within 3 months, will treat as HCAP. Another differential diagnosis is PE since patient had right hip procedure recently. Because of AKI, cannot get CAT now. ED  physician ordered 1 dose of Lovenox. Patient is septic on admission with neutropenia and elevated lactate 4.45. She is hemodynamically stable.  - Will admit to HCAP - IV Vancomycin and cefepime, Zosyn - Mucinex for cough  - DuoNeb, albuterol Neb prn for SOB - Urine legionella and S. pneumococcal antigen - Follow up blood culture x2, sputum culture, plus Flu pcr - will get Procalcitonin and trend lactic acid level per sepsis protocol - IVF: 1.5 L  of NS bolus in ED, followed by D5-1/2NS 100 cc per hour of NS (patient has diastolic congestive heart failure, limiting aggressive IV fluids treatment) -follow up D-dimer, if negative, will d/c lovenox. If D-dimer positive, will get V/Q in AM  Addendum: D dimer is 12.2. -will get V/Q scan and LE venous doppler.  Hypernatremia: Na=148. Likely due to dehydration. -D5-1/2 NS at 100 cc/h  Acute encephalopathy: Most likely due to sepsis, hypernatremia may have also contributed. -Treat underlying problems as above -Continue neuro check  HTN: -Hold her lisinopril and Lasix due to AKI -Continue nifedipine -IV hydralazine when necessary  HLD: Last LDL was not on record -Continue home medications: Zocor -Check FLP  Chronic combined systolic and diastolic heart failure, NYHA class 1 (HCC): 2-D echo on 09/03/14 showed EF of 35-40% with grade 2 diastolic dysfunction. Patient is on Lasix 40 mg twice a day. She is clinically dry.  -Hold Laix -check BNP -Continue aspirin  Leukopenia: WBC 3.3, likely due to infection -Follow-up CBC and blood culture  Elevated trop and CAD: trop 0.15. No chest pain. Most likely due to demanding ischemia secondary to sepsis. Pt had elevated trop in previous admission with the troponin up to 6.09 on 09/03/14. Cardiology was consulted, not a candidate for invasive ischemic evaluation. Cardiology recommended to continue to maximize medical care. -Continue ASA, zocor -pt was supposed to take coreg, but not taking this med. Will  add it back -trop x 3 -repeat EKG in AM  GERD: -Protonix  Protein-calorie malnutrition, severe (HCC) -Ensure  AKI: Likely due to prerenal secondary to dehydration and continuation of ACEI and diruetics - IVF as above - Check FeUrea - US-renal - Follow up renal function by BMP - Hold Diuretics and lisinopril   DVT ppx: on Lovenox  Code Status: DNR Family Communication:   Yes, patient's  2 granddaughters at bed side Disposition Plan: Admit to inpatient   Date of Service 01/08/2015    Lorretta Harp [facility] Pager [phone]  If 7PM-7AM, please contact night-coverage [email or web address] Password [email or web address] 01/08/2015, 9:28 PM

## 2015-01-08 NOTE — ED Notes (Signed)
PER EMS: pt from home, family called EMS because pt hasn't been feeling well for the past 2 days, she hasn't gotten out of bed or walked around house at much and her breathing has been labored. Pt has non-productive cough onset today. EMS found patient laying in bed, confused, tachypneic with episodes of apnea lasting about 10 seconds.

## 2015-01-08 NOTE — ED Notes (Signed)
Tyrone from phlebotomy asked to draw blood due to unsuccessful blood draw x2.

## 2015-01-09 ENCOUNTER — Inpatient Hospital Stay (HOSPITAL_COMMUNITY): Payer: Commercial Managed Care - HMO

## 2015-01-09 LAB — LIPID PANEL
CHOL/HDL RATIO: 4.3 ratio
CHOLESTEROL: 168 mg/dL (ref 0–200)
HDL: 39 mg/dL — ABNORMAL LOW (ref >40)
LDL Cholesterol: 111 mg/dL — ABNORMAL HIGH (ref 0–99)
Triglycerides: 92 mg/dL (ref <150)
VLDL: 18 mg/dL (ref 0–40)

## 2015-01-09 LAB — LEGIONELLA PNEUMOPHILA SEROGP 1 UR AG: L. pneumophila Serogp 1 Ur Ag: NEGATIVE

## 2015-01-09 LAB — MRSA PCR SCREENING: MRSA BY PCR: NEGATIVE

## 2015-01-09 LAB — INFLUENZA PANEL BY PCR (TYPE A & B)
H1N1 flu by pcr: NOT DETECTED
INFLAPCR: NEGATIVE
Influenza B By PCR: NEGATIVE

## 2015-01-09 LAB — D-DIMER, QUANTITATIVE: D-Dimer, Quant: 12.15 ug/mL-FEU — ABNORMAL HIGH (ref 0.00–0.48)

## 2015-01-09 LAB — TROPONIN I
TROPONIN I: 0.16 ng/mL — AB (ref <0.031)
TROPONIN I: 0.16 ng/mL — AB (ref <0.031)
TROPONIN I: 0.16 ng/mL — AB (ref <0.031)

## 2015-01-09 LAB — PROCALCITONIN: PROCALCITONIN: 0.11 ng/mL

## 2015-01-09 LAB — LACTIC ACID, PLASMA: LACTIC ACID, VENOUS: 3.3 mmol/L — AB (ref 0.5–2.0)

## 2015-01-09 LAB — UREA NITROGEN, URINE: Urea Nitrogen, Ur: 1027 mg/dL

## 2015-01-09 LAB — PROTIME-INR
INR: 1.81 — AB (ref 0.00–1.49)
PROTHROMBIN TIME: 20.9 s — AB (ref 11.6–15.2)

## 2015-01-09 LAB — BRAIN NATRIURETIC PEPTIDE: B Natriuretic Peptide: >4500 pg/mL — ABNORMAL HIGH (ref 0.0–100.0)

## 2015-01-09 LAB — APTT: APTT: 34 s (ref 24–37)

## 2015-01-09 MED ORDER — TECHNETIUM TC 99M DIETHYLENETRIAME-PENTAACETIC ACID: 40.1500 | Freq: Once | INTRAVENOUS | Status: DC | PRN

## 2015-01-09 MED ORDER — TECHNETIUM TO 99M ALBUMIN AGGREGATED
6.0500 | Freq: Once | INTRAVENOUS | Status: AC | PRN
  Administered 2015-01-09: 6.05 via INTRAVENOUS

## 2015-01-09 MED ORDER — RISAQUAD PO CAPS
1.0000 | ORAL_CAPSULE | Freq: Every day | ORAL | Status: DC
  Administered 2015-01-09 – 2015-01-12 (×4): 1 via ORAL
  Filled 2015-01-09 (×4): qty 1

## 2015-01-09 MED ORDER — VITAMIN D 1000 UNITS PO TABS
5000.0000 [IU] | ORAL_TABLET | Freq: Every day | ORAL | Status: DC
  Administered 2015-01-09 – 2015-01-12 (×4): 5000 [IU] via ORAL
  Filled 2015-01-09 (×4): qty 5

## 2015-01-09 MED ORDER — ENOXAPARIN SODIUM 40 MG/0.4ML ~~LOC~~ SOLN
40.0000 mg | SUBCUTANEOUS | Status: DC
  Administered 2015-01-09: 40 mg via SUBCUTANEOUS
  Filled 2015-01-09 (×2): qty 0.4

## 2015-01-09 MED ORDER — CETYLPYRIDINIUM CHLORIDE 0.05 % MT LIQD
7.0000 mL | Freq: Two times a day (BID) | OROMUCOSAL | Status: DC
  Administered 2015-01-09 – 2015-01-11 (×4): 7 mL via OROMUCOSAL

## 2015-01-09 MED ORDER — VANCOMYCIN HCL 500 MG IV SOLR
500.0000 mg | INTRAVENOUS | Status: DC
  Administered 2015-01-09 – 2015-01-10 (×2): 500 mg via INTRAVENOUS
  Filled 2015-01-09 (×3): qty 500

## 2015-01-09 MED ORDER — TECHNETIUM TC 99M DIETHYLENETRIAME-PENTAACETIC ACID: 40.2000 | Freq: Once | INTRAVENOUS | Status: DC | PRN

## 2015-01-09 MED ORDER — L-METHYLFOLATE-B6-B12 3-35-2 MG PO TABS
1.0000 | ORAL_TABLET | Freq: Every day | ORAL | Status: DC
  Administered 2015-01-09 – 2015-01-12 (×4): 1 via ORAL
  Filled 2015-01-09 (×4): qty 1

## 2015-01-09 MED ORDER — ENSURE ENLIVE PO LIQD
237.0000 mL | Freq: Two times a day (BID) | ORAL | Status: DC
  Administered 2015-01-10 – 2015-01-11 (×2): 237 mL via ORAL
  Administered 2015-01-11: 100 mL via ORAL

## 2015-01-09 MED ORDER — INFLUENZA VAC SPLIT QUAD 0.5 ML IM SUSY: 0.5000 mL | PREFILLED_SYRINGE | INTRAMUSCULAR | Status: DC

## 2015-01-09 MED ORDER — OMEGA-3-ACID ETHYL ESTERS 1 G PO CAPS
1.0000 g | ORAL_CAPSULE | Freq: Every day | ORAL | Status: DC
  Administered 2015-01-09 – 2015-01-12 (×4): 1 g via ORAL
  Filled 2015-01-09 (×4): qty 1

## 2015-01-09 NOTE — Progress Notes (Addendum)
.*  Preliminary Results* Bilateral lower extremity venous duplex completed. Visualized veins of bilateral lower extremities are negative for deep vein thrombosis. The right distal femoral vein was partially compressible without internal echoes, therefore cannot exclude deep vein thrombosis in this segment, however this vessel may not be fully compressible due to patient anatomy. There is no evidence of Baker's cyst bilaterally.  01/09/2015  Gertie Fey, RVT, RDCS, RDMS

## 2015-01-09 NOTE — Evaluation (Signed)
Physical Therapy Evaluation Patient Details Name: Traci Hale MRN: [id number] DOB: March 24, 1925 Today's Date: 01/09/2015   History of Present Illness  Patient is an 81 yo female admitted 01/08/15 with cough, SOB, weakness, AMS.  Patient with HCAP.  PMH:  hypertension, hyperlipidemia, GERD, chronic systolic and diastolic congestive heart failure, chronic shortness of breath, CAD, anxiety  Clinical Impression  Patient presents with problems listed below.  Will benefit from acute PT to maximize functional mobility prior to discharge.  Patient lives alone.  Today required max assist for mobility.  Recommend SNF for continued therapy at discharge.    Follow Up Recommendations SNF    Equipment Recommendations  None recommended by PT    Recommendations for Other Services       Precautions / Restrictions Precautions Precautions: Fall Restrictions Weight Bearing Restrictions: No      Mobility  Bed Mobility Overal bed mobility: Needs Assistance;+2 for physical assistance Bed Mobility: Supine to Sit;Sit to Supine     Supine to sit: Mod assist;+2 for physical assistance Sit to supine: Max assist;+2 for physical assistance   General bed mobility comments: Verbal and tactile cues to move to EOB.  Patient initiated transition to sitting, and then abruptly returned to sidelying, with increased RR up to 50.  Had patient relax and breathe through nose.  RR decreased to 21.  O2 sats remained 96% throughout.  Provided +2 assist to move patient to sitting EOB.  Maintained RR rate in 20's while sitting EOB.  Patient sat EOB x 8 minutes.  Returned to supine with +2 max assist.  Transfers                 General transfer comment: NT  Ambulation/Gait                Stairs            Wheelchair Mobility    Modified Rankin (Stroke Patients Only)       Balance Overall balance assessment: Needs assistance Sitting-balance support: Single extremity supported;Feet  supported Sitting balance-Leahy Scale: Fair                                       Pertinent Vitals/Pain Pain Assessment: No/denies pain    Home Living Family/patient expects to be discharged to:: Private residence Living Arrangements: Alone (Niece lives there, but works and is rarely at home) Available Help at Discharge: Family;Available PRN/intermittently Type of Home: House Home Access: Stairs to enter   Entrance Stairs-Number of Steps: 2 Home Layout: One level Home Equipment: Walker - 2 wheels;Cane - single point;Bedside commode;Shower seat      Prior Function Level of Independence: Independent with assistive device(s)         Comments: Uses RW or cane     Hand Dominance        Extremity/Trunk Assessment   Upper Extremity Assessment: Generalized weakness           Lower Extremity Assessment: Generalized weakness      Cervical / Trunk Assessment: Kyphotic  Communication   Communication: HOH  Cognition Arousal/Alertness: Awake/alert Behavior During Therapy: Anxious;Flat affect Overall Cognitive Status: History of cognitive impairments - at baseline       Memory: Decreased short-term memory              General Comments      Exercises        Assessment/Plan  PT Assessment Patient needs continued PT services  PT Diagnosis Difficulty walking;Generalized weakness;Altered mental status   PT Problem List Decreased strength;Decreased activity tolerance;Decreased balance;Decreased mobility;Decreased cognition;Decreased knowledge of use of DME;Cardiopulmonary status limiting activity  PT Treatment Interventions DME instruction;Gait training;Functional mobility training;Therapeutic activities;Therapeutic exercise;Balance training;Cognitive remediation;Patient/family education   PT Goals (Current goals can be found in the Care Plan section) Acute Rehab PT Goals Patient Stated Goal: None stated PT Goal Formulation: With  patient/family Time For Goal Achievement: 01/23/15 Potential to Achieve Goals: Fair    Frequency Min 2X/week   Barriers to discharge Decreased caregiver support Patient has prn assist only.    Co-evaluation               End of Session Equipment Utilized During Treatment: Oxygen Activity Tolerance: Patient limited by fatigue (Patient very anxious with increased RR) Patient left: in bed;with call bell/phone within reach;with bed alarm set;with family/visitor present           Time: 4098-1191 PT Time Calculation (min) (ACUTE ONLY): 14 min   Charges:   PT Evaluation $Initial PT Evaluation Tier I: 1 Procedure     PT G CodesVena Austria 01/18/2015, 6:08 PM Durenda Hurt. Renaldo Fiddler, [facility] Acute Rehab Services Pager [phone]

## 2015-01-09 NOTE — Progress Notes (Signed)
[facility] PROGRESS NOTE  Traci Hale ZOX:[id number] DOB: 14-Feb-1925 DOA: 01/08/2015 PCP: Cain Saupe, MD  Assessment/Plan: Acute on chronic respiratory failure with hypoxia (HCC) and possible HCAP: Patient's worsening shortness of breath and cough are likely caused by possible PNA as evidenced by chest x-ray. Since patient was in nursing home within 3 months, will treat as HCAP. Another differential diagnosis is PE since patient had right hip procedure recently. Because of AKI, cannot get CAT now. ED physician ordered 1 dose of Lovenox. Patient is septic on admission with neutropenia and elevated lactate 4.45. She is hemodynamically stable.  - Will admit to HCAP - IV Vancomycin and Zosyn - Mucinex for cough  - DuoNeb, albuterol Neb prn for SOB - Urine legionella and S. pneumococcal antigen - Follow up blood culture x2, sputum culture, plus Flu pcr -  Procalcitonin wnl and trend lactic acid level per sepsis protocol - IVF: 1.5 L of NS bolus in ED, followed by D5-1/2NS 100 cc per hour of NS (patient has diastolic congestive heart failure, limiting aggressive IV fluids treatment) -follow up D-dimer, if negative, will d/c lovenox. If D-dimer positive, will get V/Q in AM  Addendum: D dimer is 12.2. -will get V/Q scan and LE venous doppler neg for DVT  Hypernatremia: Na=148. Likely due to dehydration. -D5-1/2 NS at 100 cc/h  Acute encephalopathy: Most likely due to sepsis, hypernatremia may have also contributed. -Treat underlying problems as above -Continue neuro check  HTN: -Hold her lisinopril and Lasix due to AKI -Continue nifedipine -IV hydralazine when necessary  HLD: Last LDL was not on record -Continue home medications: Zocor -Check FLP  COPD exacerbation Continue to breathing treatments  Solu-Medrol  Chronic combined systolic and diastolic heart failure, NYHA class 1 (HCC): 2-D echo on 09/03/14 showed EF of 35-40% with grade 2 diastolic dysfunction. Patient is  on Lasix 40 mg twice a day. She is clinically dry.  -Hold Laix -check BNP -Continue aspirin  Leukopenia: WBC 3.3, likely due to infection -Follow-up CBC and blood culture  Elevated trop and CAD: trop 0.15. No chest pain. Most likely due to demanding ischemia secondary to sepsis. Pt had elevated trop in previous admission with the troponin up to 6.09 on 09/03/14. Cardiology was consulted, not a candidate for invasive ischemic evaluation. Cardiology recommended to continue to maximize medical care. -Continue ASA, zocor -pt was supposed to take coreg, but not taking this med. Will add it back -trop x 3 -repeat EKG in AM  GERD: -Protonix  Protein-calorie malnutrition, severe (HCC) -Ensure  AKI: Likely due to prerenal secondary to dehydration and continuation of ACEI and diruetics - IVF as above - Check FeUrea - US-renal - Follow up renal function by BMP - Hold Diuretics and lisinopril   DVT ppx: on Lovenox  Code Status: DNR Family Communication: Yes, patient's 2 granddaughters at bed side Disposition Plan: Admit to inpatient  Antibiotics: Vanc 10/7> Zosyn 10/7> HPI/Subjective: Traci Hale is a 81 y.o. female with PMH of hypertension, hyperlipidemia, GERD, chronic systolic and diastolic congestive heart failure, chronic shortness of breath, CAD, who presents with a dry cough, shortness of breath, generalized weakness, and altered mental status.  Patient has been having confused in past 2 days, she has dry cough, and worsening shortness of breath, generalized weakness. Patient was noted to have apnea for several times in the emergency room, each time lasted for a few seconds. She also has decreased oral intake, no fever, chills, chest pain. Patient had one episode of abdominal  pain in the emergency room, which has resolved after passed gas. Currently patient does not have abdominal pain, nausea, vomiting, diarrhea, symptoms of UTI she moves all extremities, no vision change  or hearing loss.  Of note, patient was hospitalized from 6/1-6/8 because of right hip fracture and elevated troponin up to 6.09. Patient had R hip hemiarthoplasty on 6/5. Cardiology was consulted in hospital, and recommended to maximize to medical treatment since patient was not a candidate for invasive procedure. She was discharged to skilled nursing home for rehab and went home two months ago.   In ED, patient was found to have elevated troponin 0.15, elevated lactate 4.45, temperature normal, no tachycardia, neutropenia with WBC 3.3, stable hemoglobin, hypernatremia with sodium of 148, AKI with cre 1.40. CT head is negative for acute abnormalities. CXR showed right base consolidation.   Objective: Filed Vitals:   01/09/15 1200  BP:   Pulse:   Temp: 97.5 F (36.4 C)  Resp:     Intake/Output Summary (Last 24 hours) at 01/09/15 1427 Last data filed at 01/09/15 1000  Gross per 24 hour  Intake 3636.67 ml  Output     25 ml  Net 3611.67 ml   Filed Weights   01/08/15 1920 01/09/15 0251  Weight: 54.432 kg (120 lb) 53.4 kg (117 lb 11.6 oz)    Exam: General: Not in acute distress. Dry mucus and membrane HEENT:  Eyes: PERRL, EOMI, no scleral icterus.  ENT: No discharge from the ears and nose, no pharynx injection, no tonsillar enlargement.   Neck: No JVD, no bruit, no mass felt. Heme: No neck lymph node enlargement. Cardiac: S1/S2, RRR, No murmurs, No gallops or rubs. Pulm: Bilateral coarse breath sounds, prolonged expiratory phase Abd: Soft, nondistended, nontender, no rebound pain, no organomegaly, BS present. Ext: No pitting leg edema bilaterally. 2+DP/PT pulse bilaterally. Musculoskeletal: No joint deformities, No joint redness or warmth, no limitation of ROM in spin. Skin: No rashes.  Neuro: Alert, not oriented X3, cranial nerves II-XII grossly intact, moves all extremities Brachial reflex 1+ bilaterally. Knee reflex 1+ bilaterally. Negative Babinski's  sign. Psych: Patient is not psychotic, no suicidal or hemocidal ideation.  Data Reviewed: Basic Metabolic Panel:  Recent Labs Lab 01/08/15 1817 01/08/15 1834  NA 147* 148*  K 3.7 3.6  CL 113* 112*  CO2 18*  --   GLUCOSE 149* 147*  BUN 49* 46*  CREATININE 1.50* 1.40*  CALCIUM 9.3  --    Liver Function Tests:  Recent Labs Lab 01/08/15 1817  AST 58*  ALT 47  ALKPHOS 76  BILITOT 0.7  PROT 7.6  ALBUMIN 3.4*   No results for input(s): LIPASE, AMYLASE in the last 168 hours. No results for input(s): AMMONIA in the last 168 hours. CBC:  Recent Labs Lab 01/08/15 1817 01/08/15 1834  WBC 3.3*  --   NEUTROABS 2.6  --   HGB 10.3* 12.6  HCT 32.1* 37.0  MCV 100.9*  --   PLT 263  --    Cardiac Enzymes:  Recent Labs Lab 01/08/15 2327 01/09/15 0326 01/09/15 1100  TROPONINI 0.16* 0.16* 0.16*   BNP (last 3 results)  Recent Labs  09/02/14 1835 01/09/15 0326  BNP 1502.4* >4500.0*    ProBNP (last 3 results) No results for input(s): PROBNP in the last 8760 hours.  CBG: No results for input(s): GLUCAP in the last 168 hours.  Recent Results (from the past 240 hour(s))  Blood Culture (routine x 2)     Status: None (Preliminary  result)   Collection Time: 01/08/15  6:16 PM  Result Value Ref Range Status   Specimen Description BLOOD RIGHT FOREARM  Final   Special Requests BOTTLES DRAWN AEROBIC ONLY 4CC  Final   Culture NO GROWTH < 12 HOURS  Final   Report Status PENDING  Incomplete  Blood Culture (routine x 2)     Status: None (Preliminary result)   Collection Time: 01/08/15  7:18 PM  Result Value Ref Range Status   Specimen Description BLOOD RIGHT ANTECUBITAL  Final   Special Requests BOTTLES DRAWN AEROBIC AND ANAEROBIC 5CC  Final   Culture NO GROWTH < 12 HOURS  Final   Report Status PENDING  Incomplete  Urine culture     Status: None (Preliminary result)   Collection Time: 01/08/15  8:59 PM  Result Value Ref Range Status   Specimen Description URINE,  CATHETERIZED  Final   Special Requests NONE  Final   Culture NO GROWTH < 24 HOURS  Final   Report Status PENDING  Incomplete  MRSA PCR Screening     Status: None   Collection Time: 01/09/15  2:58 AM  Result Value Ref Range Status   MRSA by PCR NEGATIVE NEGATIVE Final    Comment:        The GeneXpert MRSA Assay (FDA approved for NASAL specimens only), is one component of a comprehensive MRSA colonization surveillance program. It is not intended to diagnose MRSA infection nor to guide or monitor treatment for MRSA infections.      Studies: Ct Head Wo Contrast  01/08/2015   CLINICAL DATA:  Mental status changes.  Confusion.  Weakness.  EXAM: CT HEAD WITHOUT CONTRAST  TECHNIQUE: Contiguous axial images were obtained from the base of the skull through the vertex without intravenous contrast.  COMPARISON:  06/10/2014  FINDINGS: The brain shows generalized atrophy. There chronic small-vessel ischemic changes throughout the white matter. No sign of acute infarction, mass lesion, hemorrhage, hydrocephalus or extra-axial collection. The calvarium is unremarkable. Sinuses are clear. There is atherosclerotic calcification of the major vessels at the base of the brain.  IMPRESSION: Atrophy and chronic small vessel disease. No acute or reversible finding.   Electronically Signed   By: Paulina Fusi M.D.   On: 01/08/2015 20:42   US Renal  01/08/2015   CLINICAL DATA:  Patient with history of acute renal injury.  EXAM: RENAL / URINARY TRACT ULTRASOUND COMPLETE  COMPARISON:  None.  FINDINGS: Right Kidney:  Length: 10.3 cm. Diffusely increased in echogenicity. Mild renal cortical thinning. No hydronephrosis. Multiple cysts measuring up to 5 cm.  Left Kidney:  Length: 8.1 cm. Poorly visualized however no definite hydronephrosis. Diffusely increased in echogenicity. Renal cortical thinning. Multiple cysts measuring up to 5.2 cm.  Bladder:  The bladder is somewhat decompressed however there is suggestion of wall  thickening.  IMPRESSION: No definite hydronephrosis although evaluation of the left kidney is somewhat limited.  Diffusely increased renal cortical echogenicity bilaterally suggestive of medical renal disease.  Suggestion of urinary bladder wall thickening which is nonspecific may be secondary to infection in the appropriate clinical setting. Recommend correlation with urinalysis.   Electronically Signed   By: Annia Belt M.D.   On: 01/08/2015 21:48   Nm Pulmonary Perf And Vent  01/09/2015   CLINICAL DATA:  Shortness of breath, nonproductive cough x1 day, history of hypertension, CHF. Companion chest radiograph from the previous day demonstrates right lower lung consolidation possible pneumonia.  EXAM: NUCLEAR MEDICINE VENTILATION - PERFUSION LUNG SCAN  TECHNIQUE: Ventilation images were obtained in multiple projections using inhaled aerosol Tc-59m DTPA. Perfusion images were obtained in multiple projections after intravenous injection of Tc-5m MAA.  RADIOPHARMACEUTICALS:  40.2 Technetium-69m DTPA aerosol inhalation and 6.05 Technetium-21m MAA IV  COMPARISON:  Radiograph from previous day  FINDINGS: Ventilation: No focal ventilation defect.  Perfusion: Small subsegmental defects in the right middle lobe and superior segment right lower lobe. Otherwise physiologic distribution of radiopharmaceutical.  IMPRESSION: 1. Low likelihood ratio for pulmonary embolism.   Electronically Signed   By: Corlis Leak M.D.   On: 01/09/2015 11:36   Dg Chest Port 1 View  01/08/2015   CLINICAL DATA:  Nonproductive cough, onset today. Lethargy, labored breathing and confusion.  EXAM: PORTABLE CHEST 1 VIEW  COMPARISON:  09/06/2014  FINDINGS: There is new right base consolidation which could represent pneumonia. The left lung is clear. No large effusion is evident. Pulmonary vasculature is normal.  IMPRESSION: Right base consolidation, suspicious for pneumonia.   Electronically Signed   By: Ellery Plunk M.D.   On: 01/08/2015  18:37    Scheduled Meds: . acidophilus  1 capsule Oral Daily  . antiseptic oral rinse  7 mL Mouth Rinse BID  . aspirin EC  325 mg Oral Daily  . carvedilol  3.125 mg Oral BID WC  . cholecalciferol  5,000 Units Oral Daily  . dextromethorphan-guaiFENesin  1 tablet Oral BID  . docusate sodium  100 mg Oral BID  . enoxaparin (LOVENOX) injection  1 mg/kg Subcutaneous Q24H  . feeding supplement (ENSURE ENLIVE)  237 mL Oral BID BM  . ipratropium-albuterol  3 mL Nebulization Q4H  . l-methylfolate-B6-B12  1 tablet Oral Daily  . NIFEdipine  60 mg Oral Daily  . omega-3 acid ethyl esters  1 g Oral Daily  . pantoprazole  40 mg Oral Q1200  . piperacillin-tazobactam (ZOSYN)  IV  3.375 g Intravenous Q8H  . simvastatin  20 mg Oral Daily  . vancomycin  500 mg Intravenous Q24H   Continuous Infusions: . dextrose 5 % and 0.45% NaCl 100 mL/hr at 01/09/15 1308     Time spent:    Leyton Brownlee  [facility] Pager [phone] If 7PM-7AM, please contact night-coverage at [email or web address], password [facility] 01/09/2015, 2:27 PM  LOS: 1 day

## 2015-01-09 NOTE — Progress Notes (Signed)
Initial Nutrition Assessment  DOCUMENTATION CODES:  Severe malnutrition in context of chronic illness  INTERVENTION:  Once diet advanced, Ensure Enlive po BID, each supplement provides 350 kcal and 20 grams of protein  Once diet is advanced, recommend Soft or Dysphagia 3 d/t patient having no dentures and loss of teeth.  NUTRITION DIAGNOSIS:  Increased nutrient needs related to catabolic illness, chronic illness as evidenced by severe depletion of muscle mass, severe depletion of body fat.  GOAL:  Patient will meet greater than or equal to 90% of their needs  MONITOR:  PO intake, Supplement acceptance, Labs, I & O's, Diet advancement  REASON FOR ASSESSMENT:  Malnutrition Screening Tool    ASSESSMENT:  81 y.o. female with PMH of HTN, HLD, GERD, CHF  CAD, who presents with a dry cough, shortness of breath, generalized weakness, and altered mental status.  Spoke with pt and family member. Family member reports that patient has some dementia/confusion. Family member was not too familiar with how pt was eating at home. She does report that pt has had some n/v/c/d that have affected her appetite greatly.   Family member reports that pt eats whatever the niece prepares for her. She noted that she has bought the pt vit/min supplements but is unsure if the pt is actually taking them. Pt drinks 2-3 "meal replacement shakes" daily. She reports pt has some trouble chewing d/t being edentulous and does not use dentures. She asked for softer food options for the patient.   Family member did not know what pt's normal weight was, but she thought that the patient looks about the same as usual. Reviewing documentation, pts weight seems to have slightly increased in the long run, but could be fluid related.   Diet Order:  Diet NPO time specified Except for: Sips with Meds  Skin:  Dry  Last BM:  Unknown  Height:  Ht Readings from Last 1 Encounters:  01/09/15  (1.575 m)   Weight:  Wt  Readings from Last 1 Encounters:  01/09/15 117 lb 11.6 oz (53.4 kg)   Wt Readings from Last 10 Encounters:  01/09/15 117 lb 11.6 oz (53.4 kg)  09/09/14 126 lb 12.2 oz (57.5 kg)  06/12/14 129 lb 3 oz (58.6 kg)  01/26/14 117 lb 4 oz (53.184 kg)  01/10/14 108 lb 15.2 oz (49.42 kg)   Ideal Body Weight:  50 kg  BMI:  Body mass index is 21.53 kg/(m^2).  Estimated Nutritional Needs:  Kcal:  1500-1700 (28-32 kcal/kg) Protein:  64-75 g Pro Fluid:  1.5-1.7 liters fluid  EDUCATION NEEDS:  No education needs identified at this time  Christophe Louis RD, LDN Nutrition Pager: [phone] 01/09/2015 11:23 AM

## 2015-01-09 NOTE — Progress Notes (Signed)
ANTIBIOTIC CONSULT NOTE - INITIAL  Pharmacy Consult for Vancomycin and Lovenox Indication: rule out pneumonia and suspected PE  No Known Allergies  Patient Measurements: Height:  (157.5 cm) Weight: 117 lb 11.6 oz (53.4 kg) IBW/kg (Calculated) : 50.1   Vital Signs: Temp: 97.2 F (36.2 C) (10/08 0251) Temp Source: Oral (10/08 0251) BP: 136/100 mmHg (10/08 0400) Pulse Rate: 102 (10/08 0400) Intake/Output from previous day: 10/07 0701 - 10/08 0700 In: 3236.7 [I.V.:3186.7; IV Piggyback:50] Out: 25 [Urine:25] Intake/Output from this shift:    Labs:  Recent Labs  01/08/15 1817 01/08/15 1834 01/08/15 2059  WBC 3.3*  --   --   HGB 10.3* 12.6  --   PLT 263  --   --   LABCREA  --   --  153.10  CREATININE 1.50* 1.40*  --    Estimated Creatinine Clearance: 22 mL/min (by C-G formula based on Cr of 1.4). No results for input(s): VANCOTROUGH, VANCOPEAK, VANCORANDOM, GENTTROUGH, GENTPEAK, GENTRANDOM, TOBRATROUGH, TOBRAPEAK, TOBRARND, AMIKACINPEAK, AMIKACINTROU, AMIKACIN in the last 72 hours.   Microbiology: Recent Results (from the past 720 hour(s))  MRSA PCR Screening     Status: None   Collection Time: 01/09/15  2:58 AM  Result Value Ref Range Status   MRSA by PCR NEGATIVE NEGATIVE Final    Comment:        The GeneXpert MRSA Assay (FDA approved for NASAL specimens only), is one component of a comprehensive MRSA colonization surveillance program. It is not intended to diagnose MRSA infection nor to guide or monitor treatment for MRSA infections.     Medical History: Past Medical History  Diagnosis Date  . Hypertension   . CHF (congestive heart failure) (HCC)   . Hyperlipidemia   . Heart murmur   . Shortness of breath   . GERD (gastroesophageal reflux disease)     Medications:  Prescriptions prior to admission  Medication Sig Dispense Refill Last Dose  . aspirin EC 325 MG EC tablet Take 1 tablet (325 mg total) by mouth daily. (Patient taking differently:  Take 325 mg by mouth daily as needed for pain. ) 30 tablet 0 01/08/2015 at Unknown time  . Cholecalciferol (VITAMIN D-3) 5000 UNITS TABS Take 1 tablet by mouth daily.   01/08/2015 at Unknown time  . Folic Acid-Vit B6-Vit B12 (FOLGARD) 0.8-10-0.115 MG TABS Take 1 tablet by mouth daily.   01/08/2015 at Unknown time  . furosemide (LASIX) 40 MG tablet Take 1 tablet (40 mg total) by mouth 2 (two) times daily. 30 tablet 0 01/08/2015 at Unknown time  . lisinopril (PRINIVIL,ZESTRIL) 2.5 MG tablet Take 1 tablet (2.5 mg total) by mouth daily. 30 tablet 0 01/08/2015 at Unknown time  . NIFEdipine (PROCARDIA-XL/ADALAT CC) 60 MG 24 hr tablet Take 1 tablet (60 mg total) by mouth daily. 30 tablet 0 01/08/2015 at Unknown time  . Omega 3-6-9 Fatty Acids (OMEGA-3 FUSION) LIQD Take 3.2 mLs by mouth daily.  omega 3 liquid   01/08/2015 at Unknown time  . Probiotic Product (PROBIOTIC DAILY PO) Take 1 capsule by mouth daily.   01/08/2015 at Unknown time  . simvastatin (ZOCOR) 20 MG tablet Take 20 mg by mouth daily.   01/08/2015 at Unknown time   Scheduled:  . acidophilus  1 capsule Oral Daily  . antiseptic oral rinse  7 mL Mouth Rinse BID  . aspirin EC  325 mg Oral Daily  . carvedilol  3.125 mg Oral BID WC  . cholecalciferol  5,000 Units Oral Daily  .  dextromethorphan-guaiFENesin  1 tablet Oral BID  . docusate sodium  100 mg Oral BID  . enoxaparin (LOVENOX) injection  1 mg/kg Subcutaneous Q24H  . ipratropium-albuterol  3 mL Nebulization Q4H  . l-methylfolate-B6-B12  1 tablet Oral Daily  . NIFEdipine  60 mg Oral Daily  . omega-3 acid ethyl esters  1 g Oral Daily  . pantoprazole  40 mg Oral Q1200  . piperacillin-tazobactam (ZOSYN)  IV  3.375 g Intravenous Q8H  . simvastatin  20 mg Oral Daily  . vancomycin  750 mg Intravenous Q24H   Infusions:  . dextrose 5 % and 0.45% NaCl 100 mL/hr at 01/09/15 0108   Assessment: 81yo female with history of HTN, CHF, HLD and GERD presents with non-productive cough, confusion,  tachypnea with episodes of apnea. Pharmacy is consulted to dose vancomycin for suspected HCAP. Day #2 of abx for r/o PNA. Afebrile, WBC low at 3.3. SCr at 1.4, CrCl ~7ml/min.  Also empirically treating for possible PE. D-dimer was elevated at 12.15.  Goal of Therapy:  Vancomycin trough level 15-20 mcg/ml  Plan:  Change Vancomycin to  IV q24h Continue Zosyn 3.375 IV q8h Monitor clinical picture, renal function, VT prn F/U C&S, abx deescalation / LOT  Continue Lovenox /kg subcutaneously q24h Monitor CBC, s/s of bleed Follow up V/Q scan and LE venous doppler  Enzo Bi, PharmD Clinical Pharmacist Pager [phone] 01/09/2015 7:26 AM

## 2015-01-09 NOTE — ED Provider Notes (Signed)
CSN: [id number]     Arrival date & time 01/08/15  1737 History   First MD Initiated Contact with Patient 01/08/15 1747     Chief Complaint  Patient presents with  . Weakness     Patient is a 81 y.o. female presenting with weakness. The history is provided by the patient and the EMS personnel. No language interpreter was used.  Weakness   Ms. Coker presents evaluation for shortness of breath and confusion. Over the last 2 days she's had increased confusion, generalized malaise, shortness of breath. Per EMS report she has intermittent episodes of tachypnea followed by apnea up to 10 seconds. She denies any fevers. She does have some occasional chest discomforts and cough. No abdominal pain or lower extremity edema. Level V caveat due to confusion.  Past Medical History  Diagnosis Date  . Hypertension   . CHF (congestive heart failure) (HCC)   . Hyperlipidemia   . Heart murmur   . Shortness of breath   . GERD (gastroesophageal reflux disease)    Past Surgical History  Procedure Laterality Date  . Leg surgery    . Total hip arthroplasty Right 09/06/2014    Procedure: HEMI HIP ARTHROPLASTY ANTERIOR APPROACH;  Surgeon: Kathryne Hitch, MD;  Location: [facility];  Service: Orthopedics;  Laterality: Right;   Family History  Problem Relation Age of Onset  . Hypertension Father   . Breast cancer Daughter    Social History  Substance Use Topics  . Smoking status: Former Games developer  . Smokeless tobacco: Never Used     Comment: quit smoking in the 80"s   . Alcohol Use: No   OB History    No data available     Review of Systems  Neurological: Positive for weakness.  All other systems reviewed and are negative.     Allergies  Review of patient's allergies indicates no known allergies.  Home Medications   Prior to Admission medications   Medication Sig Start Date End Date Taking? Authorizing Provider  aspirin EC 325 MG EC tablet Take 1 tablet (325 mg total) by mouth  daily. Patient taking differently: Take 325 mg by mouth daily as needed for pain.  09/08/14  Yes Kathryne Hitch, MD  Cholecalciferol (VITAMIN D-3) 5000 UNITS TABS Take 1 tablet by mouth daily.   Yes Historical Provider, MD  Folic Acid-Vit B6-Vit B12 (FOLGARD) 0.8-10-0.115 MG TABS Take 1 tablet by mouth daily.   Yes Historical Provider, MD  furosemide (LASIX) 40 MG tablet Take 1 tablet (40 mg total) by mouth 2 (two) times daily. 09/09/14  Yes Jerald Kief, MD  lisinopril (PRINIVIL,ZESTRIL) 2.5 MG tablet Take 1 tablet (2.5 mg total) by mouth daily. 09/09/14  Yes Jerald Kief, MD  NIFEdipine (PROCARDIA-XL/ADALAT CC) 60 MG 24 hr tablet Take 1 tablet (60 mg total) by mouth daily. 09/09/14  Yes Jerald Kief, MD  Omega 3-6-9 Fatty Acids (OMEGA-3 FUSION) LIQD Take 3.2 mLs by mouth daily.  omega 3 liquid   Yes Historical Provider, MD  Probiotic Product (PROBIOTIC DAILY PO) Take 1 capsule by mouth daily.   Yes Historical Provider, MD  simvastatin (ZOCOR) 20 MG tablet Take 20 mg by mouth daily.   Yes Historical Provider, MD   BP 152/108 mmHg  Pulse 95  Temp(Src) 98.2 F (36.8 C) (Rectal)  Resp 22  Ht  (1.575 m)  Wt 120 lb (54.432 kg)  BMI 21.94 kg/m2  SpO2 100% Physical Exam  Constitutional: She appears well-developed and  well-nourished.  HENT:  Head: Normocephalic and atraumatic.  Cardiovascular: Normal rate and regular rhythm.   No murmur heard. Pulmonary/Chest:  Tachypnea with occasional rhonchi bilaterally  Abdominal: Soft. There is no tenderness. There is no rebound and no guarding.  Musculoskeletal: She exhibits no edema or tenderness.  Neurological: She is alert.  Disoriented to time, mild generalized weakness.  Skin: Skin is warm and dry.  Psychiatric: She has a normal mood and affect. Her behavior is normal.  Nursing note and vitals reviewed.   ED Course  Procedures (including critical care time) Labs Review Labs Reviewed  COMPREHENSIVE METABOLIC PANEL -  Abnormal; Notable for the following:    Sodium 147 (*)    Chloride 113 (*)    CO2 18 (*)    Glucose, Bld 149 (*)    BUN 49 (*)    Creatinine, Ser 1.50 (*)    Albumin 3.4 (*)    AST 58 (*)    GFR calc non Af Amer 30 (*)    GFR calc Af Amer 35 (*)    Anion gap 16 (*)    All other components within normal limits  CBC WITH DIFFERENTIAL/PLATELET - Abnormal; Notable for the following:    WBC 3.3 (*)    RBC 3.18 (*)    Hemoglobin 10.3 (*)    HCT 32.1 (*)    MCV 100.9 (*)    RDW 15.9 (*)    Lymphs Abs 0.3 (*)    All other components within normal limits  URINALYSIS, ROUTINE W REFLEX MICROSCOPIC (NOT AT [facility]) - Abnormal; Notable for the following:    Specific Gravity, Urine >1.030 (*)    Hgb urine dipstick SMALL (*)    Protein, ur 100 (*)    Leukocytes, UA TRACE (*)    All other components within normal limits  TROPONIN I - Abnormal; Notable for the following:    Troponin I 0.16 (*)    All other components within normal limits  LACTIC ACID, PLASMA - Abnormal; Notable for the following:    Lactic Acid, Venous 3.3 (*)    All other components within normal limits  D-DIMER, QUANTITATIVE (NOT AT [facility]) - Abnormal; Notable for the following:    D-Dimer, Quant 12.15 (*)    All other components within normal limits  URINE MICROSCOPIC-ADD ON - Abnormal; Notable for the following:    Casts HYALINE CASTS (*)    All other components within normal limits  I-STAT CG4 LACTIC ACID, ED - Abnormal; Notable for the following:    Lactic Acid, Venous 4.45 (*)    All other components within normal limits  I-STAT VENOUS BLOOD GAS, ED - Abnormal; Notable for the following:    pH, Ven 7.481 (*)    pCO2, Ven 28.7 (*)    pO2, Ven 67.0 (*)    All other components within normal limits  I-STAT TROPOININ, ED - Abnormal; Notable for the following:    Troponin i, poc 0.15 (*)    All other components within normal limits  I-STAT CHEM 8, ED - Abnormal; Notable for the following:    Sodium 148 (*)    Chloride  112 (*)    BUN 46 (*)    Creatinine, Ser 1.40 (*)    Glucose, Bld 147 (*)    Calcium, Ion 1.09 (*)    All other components within normal limits  URINE CULTURE  CULTURE, BLOOD (ROUTINE X 2)  CULTURE, BLOOD (ROUTINE X 2)  CULTURE, EXPECTORATED SPUTUM-ASSESSMENT  GRAM STAIN  CREATININE, URINE, RANDOM  STREP PNEUMONIAE URINARY ANTIGEN  UREA NITROGEN, URINE  TROPONIN I  TROPONIN I  INFLUENZA PANEL BY PCR (TYPE A & B, H1N1)(NOT AT [facility])  LIPID PANEL  LEGIONELLA PNEUMOPHILA SEROGP 1 UR AG  PROTIME-INR  APTT  LACTIC ACID, PLASMA  PROCALCITONIN  BRAIN NATRIURETIC PEPTIDE    Imaging Review Ct Head Wo Contrast  01/08/2015   CLINICAL DATA:  Mental status changes.  Confusion.  Weakness.  EXAM: CT HEAD WITHOUT CONTRAST  TECHNIQUE: Contiguous axial images were obtained from the base of the skull through the vertex without intravenous contrast.  COMPARISON:  06/10/2014  FINDINGS: The brain shows generalized atrophy. There chronic small-vessel ischemic changes throughout the white matter. No sign of acute infarction, mass lesion, hemorrhage, hydrocephalus or extra-axial collection. The calvarium is unremarkable. Sinuses are clear. There is atherosclerotic calcification of the major vessels at the base of the brain.  IMPRESSION: Atrophy and chronic small vessel disease. No acute or reversible finding.   Electronically Signed   By: Paulina Fusi M.D.   On: 01/08/2015 20:42   US Renal  01/08/2015   CLINICAL DATA:  Patient with history of acute renal injury.  EXAM: RENAL / URINARY TRACT ULTRASOUND COMPLETE  COMPARISON:  None.  FINDINGS: Right Kidney:  Length: 10.3 cm. Diffusely increased in echogenicity. Mild renal cortical thinning. No hydronephrosis. Multiple cysts measuring up to 5 cm.  Left Kidney:  Length: 8.1 cm. Poorly visualized however no definite hydronephrosis. Diffusely increased in echogenicity. Renal cortical thinning. Multiple cysts measuring up to 5.2 cm.  Bladder:  The bladder is somewhat  decompressed however there is suggestion of wall thickening.  IMPRESSION: No definite hydronephrosis although evaluation of the left kidney is somewhat limited.  Diffusely increased renal cortical echogenicity bilaterally suggestive of medical renal disease.  Suggestion of urinary bladder wall thickening which is nonspecific may be secondary to infection in the appropriate clinical setting. Recommend correlation with urinalysis.   Electronically Signed   By: Annia Belt M.D.   On: 01/08/2015 21:48   Dg Chest Port 1 View  01/08/2015   CLINICAL DATA:  Nonproductive cough, onset today. Lethargy, labored breathing and confusion.  EXAM: PORTABLE CHEST 1 VIEW  COMPARISON:  09/06/2014  FINDINGS: There is new right base consolidation which could represent pneumonia. The left lung is clear. No large effusion is evident. Pulmonary vasculature is normal.  IMPRESSION: Right base consolidation, suspicious for pneumonia.   Electronically Signed   By: Ellery Plunk M.D.   On: 01/08/2015 18:37   I have personally reviewed and evaluated these images and lab results as part of my medical decision-making.   EKG Interpretation None      MDM   Final diagnoses:  AKI (acute kidney injury) (HCC)  SOB (shortness of breath)    Pt here for increased SOB.  Pt with increased WOB on initial evaluation, this resolved prior to intervention and patient's mental status improved on recheck.  CXR concerning for pna - treated for HCAP, but on further review of chart pt without hospitalization in the last three months. Patient's SOB is out of relation to degree of infiltrate on CXR, ddimer obtained given her recent hip fracture, treating empirically for possible PE.  VQ scan ordered due to acute kidney injury.  Plan to admit to hospitalist for further treatment.  Discussed with Dr. Clyde Lundborg.     Tilden Fossa, MD 01/09/15 [phone]

## 2015-01-09 NOTE — Progress Notes (Signed)
Utilization Review Completed.  

## 2015-01-10 LAB — BASIC METABOLIC PANEL
Anion gap: 11 (ref 5–15)
BUN: 40 mg/dL — AB (ref 6–20)
CO2: 17 mmol/L — AB (ref 22–32)
Calcium: 8.3 mg/dL — ABNORMAL LOW (ref 8.9–10.3)
Chloride: 111 mmol/L (ref 101–111)
Creatinine, Ser: 1.3 mg/dL — ABNORMAL HIGH (ref 0.44–1.00)
GFR, EST AFRICAN AMERICAN: 41 mL/min — AB (ref >60)
GFR, EST NON AFRICAN AMERICAN: 36 mL/min — AB (ref >60)
GLUCOSE: 118 mg/dL — AB (ref 65–99)
Potassium: 4 mmol/L (ref 3.5–5.1)
Sodium: 139 mmol/L (ref 135–145)

## 2015-01-10 LAB — URINE CULTURE

## 2015-01-10 MED ORDER — IPRATROPIUM-ALBUTEROL 0.5-2.5 (3) MG/3ML IN SOLN
3.0000 mL | Freq: Three times a day (TID) | RESPIRATORY_TRACT | Status: DC
  Administered 2015-01-11 – 2015-01-12 (×4): 3 mL via RESPIRATORY_TRACT
  Filled 2015-01-10 (×4): qty 3

## 2015-01-10 NOTE — Progress Notes (Signed)
[facility] PROGRESS NOTE  Traci Hale KGM:[id number] DOB: [birth year] DOA: 01/08/2015 PCP: Cain Saupe, MD   81 y/o ?  Admit 09-2014 for hip #, complicated by NSTEMI/AECHF at that admit, stg II pressure ulcer Prior admi 06/2014 Strep Pna/h flu sepsis CHF with last ECHO 09/03/14 EF 35-40%, PA Peak 56 mm hg Prior L Hp # s/p repair 09/2008  Admitted again to [facility] 01/09/15 c apnea and sob. Ed work-up elevated troponin 0.15, elevated lactate 4.45, temperature normal, no tachycardia, neutropenia with WBC 3.3, stable hemoglobin, hypernatremia with sodium of 148, AKI with cre 1.40. CT head is negative for acute abnormalities. CXR showed right base consolidation    Assessment/Plan: Acute on chronic respiratory failure with hypoxia (HCC) and possible HCAP:  Patient's worsening shortness of breath and cough are likely caused by possible PNA as evidenced by chest x-ray. Since patient was in nursing home within 3 months, will treat as HCAP.   Patient is septic on admission with neutropenia and elevated lactate 4.45. She is hemodynamically stable. PE ruled out with low probability VQ - Urine legionella and S. pneumococcal antigen , and flu pcr neg -BC ngtd - IVF: 1.5 L of NS bolus in ED, followed by D5-1/2NS 100 cc per hour of NS (patient has diastolic congestive heart failure, limiting aggressive IV fluids treatment)  Hypernatremia: Na=148.  Likely due to dehydration. -no labs ordered this am, emipirically cut back rate to 50 cc/hr on 10/9  Acute encephalopathy, toxic metabolic  Most likely due to sepsis, hypernatremia may have also contributed. -completely resolved -ok for regular diet  HTN: -Hold her lisinopril and Lasix due to AKI-may resume in am -Continue nifedipine -IV hydralazine when necessary  HLD: Last LDL was not on record -Continue home medications: Zocor -Check FLP  COPD exacerbation Continue to breathing treatments Was never on Solu-medrol  Chronic combined  systolic and diastolic heart failure, NYHA class 1 (HCC): 2-D echo on 09/03/14 showed EF of 35-40% with grade 2 diastolic dysfunction. Patient is on Lasix 40 mg twice a day. She is clinically dry.  -lasix held sicne admit -BNP elevated but patient still clinically is more volem depleted than overloaded -Continue aspirin  Leukopenia: WBC 3.3, likely due to infection -Follow-up CBC and blood culture  Elevated trop and CAD: trop 0.15. No chest pain. Most likely due to demanding ischemia secondary to sepsis. Pt had elevated trop in previous admission with the troponin up to 6.09 on 09/03/14. Cardiology was consulted, not a candidate for invasive ischemic evaluation. Cardiology recommended to continue to maximize medical care. -Continue ASA, zocor -pt was supposed to take coreg, but not taking this med. Added back at 3.125 dose on 10.8 -trop x 3 trend is fla tand no cp so no furthe rw/u  GERD: -Protonix  Protein-calorie malnutrition, severe (HCC) -Ensure  AKI: Likely due to prerenal secondary to dehydration and continuation of ACEI and diruetics - IVF as above - US-renal suggestive infections etiology - Follow up renal function by BMP - Hold Diuretics and lisinopril   DVT ppx: on Lovenox  Code Status: DNR Family Communication: Yes, patient's 2 granddaughters at bed side Disposition Plan: Admit to inpatient  Antibiotics: Vanc 10/7> Zosyn 10/7> HPI/Subjective: Traci Hale is a 81 y.o. female with PMH of hypertension, hyperlipidemia, GERD, chronic systolic and diastolic congestive heart failure, chronic shortness of breath, CAD, who presents with a dry cough, shortness of breath, generalized weakness, and altered mental status.  Patient has been having confused in past 2 days, she  has dry cough, and worsening shortness of breath, generalized weakness. Patient was noted to have apnea for several times in the emergency room, each time lasted for a few seconds. She also has decreased  oral intake, no fever, chills, chest pain. Patient had one episode of abdominal pain in the emergency room, which has resolved after passed gas. Currently patient does not have abdominal pain, nausea, vomiting, diarrhea, symptoms of UTI she moves all extremities, no vision change or hearing loss.  Of note, patient was hospitalized from 6/1-6/8 because of right hip fracture and elevated troponin up to 6.09. Patient had R hip hemiarthoplasty on 6/5. Cardiology was consulted in hospital, and recommended to maximize to medical treatment since patient was not a candidate for invasive procedure. She was discharged to skilled nursing home for rehab and went home two months ago.   In ED, patient was found to have elevated troponin 0.15, elevated lactate 4.45, temperature normal, no tachycardia, neutropenia with WBC 3.3, stable hemoglobin, hypernatremia with sodium of 148, AKI with cre 1.40. CT head is negative for acute abnormalities. CXR showed right base consolidation.   Objective: Filed Vitals:   01/10/15 0800  BP: 111/69  Pulse: 63  Temp: 97.7 F (36.5 C)  Resp: 18    Intake/Output Summary (Last 24 hours) at 01/10/15 1110 Last data filed at 01/10/15 1610  Gross per 24 hour  Intake 2323.33 ml  Output      0 ml  Net 2323.33 ml   Filed Weights   01/08/15 1920 01/09/15 0251 01/10/15 0456  Weight: 54.432 kg (120 lb) 53.4 kg (117 lb 11.6 oz) 55.6 kg (122 lb 9.2 oz)    Exam:  Alert slight hoh no distress tol diet Complaining about lack of food No rales rhonchi nop le edema No jvd   Data Reviewed: Basic Metabolic Panel:  Recent Labs Lab 01/08/15 1817 01/08/15 1834  NA 147* 148*  K 3.7 3.6  CL 113* 112*  CO2 18*  --   GLUCOSE 149* 147*  BUN 49* 46*  CREATININE 1.50* 1.40*  CALCIUM 9.3  --    Liver Function Tests:  Recent Labs Lab 01/08/15 1817  AST 58*  ALT 47  ALKPHOS 76  BILITOT 0.7  PROT 7.6  ALBUMIN 3.4*   No results for input(s): LIPASE, AMYLASE in the last  168 hours. No results for input(s): AMMONIA in the last 168 hours. CBC:  Recent Labs Lab 01/08/15 1817 01/08/15 1834  WBC 3.3*  --   NEUTROABS 2.6  --   HGB 10.3* 12.6  HCT 32.1* 37.0  MCV 100.9*  --   PLT 263  --    Cardiac Enzymes:  Recent Labs Lab 01/08/15 2327 01/09/15 0326 01/09/15 1100  TROPONINI 0.16* 0.16* 0.16*   BNP (last 3 results)  Recent Labs  09/02/14 1835 01/09/15 0326  BNP 1502.4* >4500.0*    ProBNP (last 3 results) No results for input(s): PROBNP in the last 8760 hours.  CBG: No results for input(s): GLUCAP in the last 168 hours.  Recent Results (from the past 240 hour(s))  Blood Culture (routine x 2)     Status: None (Preliminary result)   Collection Time: 01/08/15  6:16 PM  Result Value Ref Range Status   Specimen Description BLOOD RIGHT FOREARM  Final   Special Requests BOTTLES DRAWN AEROBIC ONLY 4CC  Final   Culture NO GROWTH 2 DAYS  Final   Report Status PENDING  Incomplete  Blood Culture (routine x 2)  Status: None (Preliminary result)   Collection Time: 01/08/15  7:18 PM  Result Value Ref Range Status   Specimen Description BLOOD RIGHT ANTECUBITAL  Final   Special Requests BOTTLES DRAWN AEROBIC AND ANAEROBIC 5CC  Final   Culture NO GROWTH 2 DAYS  Final   Report Status PENDING  Incomplete  Urine culture     Status: None   Collection Time: 01/08/15  8:59 PM  Result Value Ref Range Status   Specimen Description URINE, CATHETERIZED  Final   Special Requests NONE  Final   Culture 1,000 COLONIES/mL INSIGNIFICANT GROWTH  Final   Report Status 01/10/2015 FINAL  Final  MRSA PCR Screening     Status: None   Collection Time: 01/09/15  2:58 AM  Result Value Ref Range Status   MRSA by PCR NEGATIVE NEGATIVE Final    Comment:        The GeneXpert MRSA Assay (FDA approved for NASAL specimens only), is one component of a comprehensive MRSA colonization surveillance program. It is not intended to diagnose MRSA infection nor to guide  or monitor treatment for MRSA infections.      Studies: Ct Head Wo Contrast  01/08/2015   CLINICAL DATA:  Mental status changes.  Confusion.  Weakness.  EXAM: CT HEAD WITHOUT CONTRAST  TECHNIQUE: Contiguous axial images were obtained from the base of the skull through the vertex without intravenous contrast.  COMPARISON:  06/10/2014  FINDINGS: The brain shows generalized atrophy. There chronic small-vessel ischemic changes throughout the white matter. No sign of acute infarction, mass lesion, hemorrhage, hydrocephalus or extra-axial collection. The calvarium is unremarkable. Sinuses are clear. There is atherosclerotic calcification of the major vessels at the base of the brain.  IMPRESSION: Atrophy and chronic small vessel disease. No acute or reversible finding.   Electronically Signed   By: Paulina Fusi M.D.   On: 01/08/2015 20:42   US Renal  01/08/2015   CLINICAL DATA:  Patient with history of acute renal injury.  EXAM: RENAL / URINARY TRACT ULTRASOUND COMPLETE  COMPARISON:  None.  FINDINGS: Right Kidney:  Length: 10.3 cm. Diffusely increased in echogenicity. Mild renal cortical thinning. No hydronephrosis. Multiple cysts measuring up to 5 cm.  Left Kidney:  Length: 8.1 cm. Poorly visualized however no definite hydronephrosis. Diffusely increased in echogenicity. Renal cortical thinning. Multiple cysts measuring up to 5.2 cm.  Bladder:  The bladder is somewhat decompressed however there is suggestion of wall thickening.  IMPRESSION: No definite hydronephrosis although evaluation of the left kidney is somewhat limited.  Diffusely increased renal cortical echogenicity bilaterally suggestive of medical renal disease.  Suggestion of urinary bladder wall thickening which is nonspecific may be secondary to infection in the appropriate clinical setting. Recommend correlation with urinalysis.   Electronically Signed   By: Annia Belt M.D.   On: 01/08/2015 21:48   Nm Pulmonary Perf And Vent  01/09/2015    CLINICAL DATA:  Shortness of breath, nonproductive cough x1 day, history of hypertension, CHF. Companion chest radiograph from the previous day demonstrates right lower lung consolidation possible pneumonia.  EXAM: NUCLEAR MEDICINE VENTILATION - PERFUSION LUNG SCAN  TECHNIQUE: Ventilation images were obtained in multiple projections using inhaled aerosol Tc-53m DTPA. Perfusion images were obtained in multiple projections after intravenous injection of Tc-44m MAA.  RADIOPHARMACEUTICALS:  40.2 Technetium-73m DTPA aerosol inhalation and 6.05 Technetium-31m MAA IV  COMPARISON:  Radiograph from previous day  FINDINGS: Ventilation: No focal ventilation defect.  Perfusion: Small subsegmental defects in the right middle lobe and superior  segment right lower lobe. Otherwise physiologic distribution of radiopharmaceutical.  IMPRESSION: 1. Low likelihood ratio for pulmonary embolism.   Electronically Signed   By: Corlis Leak M.D.   On: 01/09/2015 11:36   Dg Chest Port 1 View  01/08/2015   CLINICAL DATA:  Nonproductive cough, onset today. Lethargy, labored breathing and confusion.  EXAM: PORTABLE CHEST 1 VIEW  COMPARISON:  09/06/2014  FINDINGS: There is new right base consolidation which could represent pneumonia. The left lung is clear. No large effusion is evident. Pulmonary vasculature is normal.  IMPRESSION: Right base consolidation, suspicious for pneumonia.   Electronically Signed   By: Ellery Plunk M.D.   On: 01/08/2015 18:37    Scheduled Meds: . acidophilus  1 capsule Oral Daily  . antiseptic oral rinse  7 mL Mouth Rinse BID  . aspirin EC  325 mg Oral Daily  . carvedilol  3.125 mg Oral BID WC  . cholecalciferol  5,000 Units Oral Daily  . dextromethorphan-guaiFENesin  1 tablet Oral BID  . docusate sodium  100 mg Oral BID  . enoxaparin (LOVENOX) injection  40 mg Subcutaneous Q24H  . feeding supplement (ENSURE ENLIVE)  237 mL Oral BID BM  . ipratropium-albuterol  3 mL Nebulization Q4H  .  l-methylfolate-B6-B12  1 tablet Oral Daily  . NIFEdipine  60 mg Oral Daily  . omega-3 acid ethyl esters  1 g Oral Daily  . pantoprazole  40 mg Oral Q1200  . piperacillin-tazobactam (ZOSYN)  IV  3.375 g Intravenous Q8H  . simvastatin  20 mg Oral Daily  . vancomycin  500 mg Intravenous Q24H   Continuous Infusions: . dextrose 5 % and 0.45% NaCl 100 mL/hr at 01/09/15 1308     Pleas Koch, MD [facility] [phone]

## 2015-01-11 DIAGNOSIS — I10 Essential (primary) hypertension: Secondary | ICD-10-CM

## 2015-01-11 DIAGNOSIS — E43 Unspecified severe protein-calorie malnutrition: Secondary | ICD-10-CM

## 2015-01-11 DIAGNOSIS — N179 Acute kidney failure, unspecified: Secondary | ICD-10-CM

## 2015-01-11 DIAGNOSIS — J189 Pneumonia, unspecified organism: Secondary | ICD-10-CM

## 2015-01-11 DIAGNOSIS — I5042 Chronic combined systolic (congestive) and diastolic (congestive) heart failure: Secondary | ICD-10-CM

## 2015-01-11 LAB — CBC WITH DIFFERENTIAL/PLATELET
BASOS ABS: 0 10*3/uL (ref 0.0–0.1)
BASOS PCT: 0 %
EOS ABS: 0.1 10*3/uL (ref 0.0–0.7)
Eosinophils Relative: 1 %
HEMATOCRIT: 29.5 % — AB (ref 36.0–46.0)
HEMOGLOBIN: 9.4 g/dL — AB (ref 12.0–15.0)
Lymphocytes Relative: 18 %
Lymphs Abs: 0.7 10*3/uL (ref 0.7–4.0)
MCH: 32.2 pg (ref 26.0–34.0)
MCHC: 31.9 g/dL (ref 30.0–36.0)
MCV: 101 fL — ABNORMAL HIGH (ref 78.0–100.0)
Monocytes Absolute: 0.3 10*3/uL (ref 0.1–1.0)
Monocytes Relative: 9 %
NEUTROS ABS: 2.7 10*3/uL (ref 1.7–7.7)
NEUTROS PCT: 72 %
Platelets: 213 10*3/uL (ref 150–400)
RBC: 2.92 MIL/uL — ABNORMAL LOW (ref 3.87–5.11)
RDW: 15.5 % (ref 11.5–15.5)
WBC: 3.7 10*3/uL — AB (ref 4.0–10.5)

## 2015-01-11 LAB — BASIC METABOLIC PANEL
ANION GAP: 10 (ref 5–15)
Anion gap: 10 (ref 5–15)
BUN: 27 mg/dL — ABNORMAL HIGH (ref 6–20)
BUN: 22 mg/dL — AB (ref 6–20)
CALCIUM: 8.1 mg/dL — AB (ref 8.9–10.3)
CHLORIDE: 108 mmol/L (ref 101–111)
CO2: 21 mmol/L — ABNORMAL LOW (ref 22–32)
CO2: 21 mmol/L — ABNORMAL LOW (ref 22–32)
CREATININE: 1.09 mg/dL — AB (ref 0.44–1.00)
CREATININE: 1.15 mg/dL — AB (ref 0.44–1.00)
Calcium: 8.2 mg/dL — ABNORMAL LOW (ref 8.9–10.3)
Chloride: 110 mmol/L (ref 101–111)
GFR, EST AFRICAN AMERICAN: 51 mL/min — AB (ref >60)
GFR, EST AFRICAN AMERICAN: 48 mL/min — AB (ref >60)
GFR, EST NON AFRICAN AMERICAN: 44 mL/min — AB (ref >60)
GFR, EST NON AFRICAN AMERICAN: 41 mL/min — AB (ref >60)
Glucose, Bld: 91 mg/dL (ref 65–99)
Glucose, Bld: 90 mg/dL (ref 65–99)
POTASSIUM: 3.4 mmol/L — AB (ref 3.5–5.1)
Potassium: 2.8 mmol/L — ABNORMAL LOW (ref 3.5–5.1)
SODIUM: 141 mmol/L (ref 135–145)
SODIUM: 139 mmol/L (ref 135–145)

## 2015-01-11 LAB — MAGNESIUM: MAGNESIUM: 2 mg/dL (ref 1.7–2.4)

## 2015-01-11 MED ORDER — FUROSEMIDE 40 MG PO TABS
40.0000 mg | ORAL_TABLET | Freq: Every day | ORAL | Status: DC
  Administered 2015-01-11 – 2015-01-12 (×2): 40 mg via ORAL
  Filled 2015-01-11 (×2): qty 1

## 2015-01-11 MED ORDER — ENOXAPARIN SODIUM 30 MG/0.3ML ~~LOC~~ SOLN
30.0000 mg | SUBCUTANEOUS | Status: DC
  Administered 2015-01-11: 30 mg via SUBCUTANEOUS
  Filled 2015-01-11: qty 0.3

## 2015-01-11 MED ORDER — LEVOFLOXACIN 750 MG PO TABS
750.0000 mg | ORAL_TABLET | ORAL | Status: DC
  Administered 2015-01-11: 750 mg via ORAL
  Filled 2015-01-11: qty 1

## 2015-01-11 MED ORDER — POTASSIUM CHLORIDE CRYS ER 20 MEQ PO TBCR
40.0000 meq | EXTENDED_RELEASE_TABLET | Freq: Once | ORAL | Status: AC
  Administered 2015-01-11: 40 meq via ORAL
  Filled 2015-01-11: qty 2

## 2015-01-11 NOTE — Plan of Care (Signed)
Problem: Phase I Progression Outcomes Goal: Dyspnea controlled at rest Outcome: Not Met (add Reason) Seems to have shortness of breath when swallowing.

## 2015-01-11 NOTE — Evaluation (Signed)
Occupational Therapy Evaluation Patient Details Name: KYESHA BALLA MRN: [id number] DOB: [birth year] Today's Date: 01/11/2015    History of Present Illness Patient is an 81 yo female admitted 01/08/15 with cough, SOB, weakness, AMS.  Patient with HCAP.  PMH:  hypertension, hyperlipidemia, GERD, chronic systolic and diastolic congestive heart failure, chronic shortness of breath, CAD, anxiety   Clinical Impression   This 81 yo female admitted with above presents to acute OT with generalized weakness, decreased mobility, decreased balance all affecting her ability to care for herself at home at a Mod I level for basic ADLs. She will benefit from acute OT with follow up OT at SNF.    Follow Up Recommendations  SNF    Equipment Recommendations   (TBD at next venue)       Precautions / Restrictions Precautions Precautions: Fall Restrictions Weight Bearing Restrictions: No      Mobility Bed Mobility Overal bed mobility: Needs Assistance Bed Mobility: Supine to Sit     Supine to sit: Max assist        Transfers Overall transfer level: Needs assistance   Transfers: Sit to/from Stand;Stand Pivot Transfers Sit to Stand: Mod assist Stand pivot transfers: Mod assist            Balance Overall balance assessment: Needs assistance Sitting-balance support: Bilateral upper extremity supported;Feet supported Sitting balance-Leahy Scale: Fair     Standing balance support: Bilateral upper extremity supported;During functional activity Standing balance-Leahy Scale: Poor                              ADL Overall ADL's : Needs assistance/impaired Eating/Feeding: Set up;Supervision/ safety;Bed level   Grooming: Set up;Supervision/safety;Sitting   Upper Body Bathing: Set up;Supervision/ safety;Sitting   Lower Body Bathing: Maximal assistance (with Mod A sit<>stand)   Upper Body Dressing : Moderate assistance;Sitting   Lower Body Dressing: Total assistance  (with Mod A sit<>stand)   Toilet Transfer: Moderate assistance;Stand-pivot (bed>recliner going to pt's left)   Toileting- Clothing Manipulation and Hygiene: Total assistance               Vision Additional Comments: No change from baseline, wears bifocals          Pertinent Vitals/Pain Pain Assessment: No/denies pain     Hand Dominance Right   Extremity/Trunk Assessment Upper Extremity Assessment Upper Extremity Assessment: Generalized weakness           Communication Communication Communication: HOH   Cognition Arousal/Alertness: Awake/alert Behavior During Therapy: Flat affect Overall Cognitive Status: History of cognitive impairments - at baseline                                Home Living Family/patient expects to be discharged to:: Skilled nursing facility Living Arrangements: Alone (niece lives there, but works) Available Help at Discharge: Family;Available PRN/intermittently Type of Home: House Home Access: Stairs to enter Entrance Stairs-Number of Steps: 2                   Home Equipment: Walker - 2 wheels;Cane - single point;Bedside commode;Shower seat          Prior Functioning/Environment Level of Independence: Independent with assistive device(s)        Comments: Uses RW or cane    OT Diagnosis: Generalized weakness   OT Problem List: Decreased strength;Decreased activity tolerance;Impaired balance (sitting and/or standing);Cardiopulmonary status limiting activity (DOE)  OT Treatment/Interventions: Self-care/ADL training;Patient/family education;Balance training;Energy conservation;Therapeutic activities;Cognitive remediation/compensation    OT Goals(Current goals can be found in the care plan section) Acute Rehab OT Goals Patient Stated Goal: to go home OT Goal Formulation: With patient Time For Goal Achievement: 01/18/15 Potential to Achieve Goals: Good  OT Frequency: Min 2X/week   Barriers to D/C: Decreased  caregiver support             End of Session Equipment Utilized During Treatment: Gait belt Nurse Communication: Mobility status (+2 stand pivot)  Activity Tolerance: Patient limited by fatigue Patient left: in chair;with call bell/phone within reach;with chair alarm set   Time: [phone] OT Time Calculation (min): 37 min Charges:  OT General Charges $OT Visit: 1 Procedure OT Evaluation $Initial OT Evaluation Tier I: 1 Procedure OT Treatments $Self Care/Home Management : 8-22 mins  Evette Georges [phone] 01/11/2015, 1:25 PM

## 2015-01-11 NOTE — Progress Notes (Signed)
[facility] PROGRESS NOTE  Traci Hale:[id number] DOB: 1925/02/04 DOA: 01/08/2015 PCP: Cain Saupe, MD  Brief HPI 81 y/o African-American female admitted in June for hip #, complicated by NSTEMI/AECHF at that admit, stg II pressure ulcer. Prior admission 06/2014 Strep Pna/h flu sepsis. CHF with last ECHO 09/03/14 EF 35-40%, PA Peak 56 mm hg. Admitted again to [facility] 01/09/15 c apnea and sob. She was found to have healthcare associated pneumonia.   Assessment/Plan:  Acute on chronic respiratory failure with hypoxia Patient's worsening shortness of breath and cough are likely caused by possible PNA as evidenced by chest x-ray. Respiratory failure has resolved. Oxygen as needed.   Healthcare associated pneumonia Patient was started on vancomycin and Zosyn. Patient is improving. Patient was septic on admission with neutropenia and elevated lactate 4.45. She is hemodynamically stable. PE ruled out with low probability VQ. Urine legionella and S. pneumococcal antigen , and flu pcr neg. . Blood cultures negative so far. Okay to change to oral antibiotics today.  Hypernatremia No resolved. It was likely due to dehydration. Stop IV fluids.  Acute encephalopathy, toxic metabolic Most likely due to sepsis, hypernatremia may have also contributed. Now resolved.   Elevated trop and CAD trop 0.15. No chest pain. Most likely due to demand ischemia secondary to sepsis. Pt had elevated trop in previous admission with the troponin up to 6.09 on 09/03/14. Cardiology was consulted, not a candidate for invasive ischemic evaluation. Cardiology recommended to continue to maximize medical care. Continue ASA, zocor. Patient was supposed to take coreg, but not taking this med. Added back at 3.125 dose on 10.8.  Mild AKI Likely due to prerenal secondary to dehydration and continuation of ACEI and diuretics. Improved with IV fluids. Medical renal disease noted on ultrasound. UA does not suggest  infection.  Acute COPD exacerbation Seems to be improved. Continue nebulizer treatments as needed.   Chronic combined systolic and diastolic heart failure, NYHA class 1  2-D echo on 09/03/14 showed EF of 35-40% with grade 2 diastolic dysfunction. Patient is on Lasix 40 mg twice a day. She was clinically dry . At the time of admission. Lasix was held. He has gained some weight. Reinitiated a lower dose.  Hypokalemia Replete potassium. Check magnesium. Recheck blood work later today.  Essential HTN Holding her lisinopril and Lasix due to AKI. Renal function is improving. Continue nifedipine for now. IV hydralazine when necessary.  HLD Continue home medications: Zocor.   GERD: Protonix  Protein-calorie malnutrition, severe Ensure  DVT ppx: on Lovenox Code Status: DNR Family Communication:No family at bedside. Disposition Plan: PT is recommending SNF. Patient was apparently in a skilled nursing facility back in June after her hip surgery. But this time around she came from home. Social worker has been consulted.  Antibiotics: Vanc 10/7>10/10 Zosyn 10/7>10/10 Levaquin 10/10  Subjective: Patient denies any complaints this morning. Denies chest pain, shortness of breath, nausea, vomiting or abdominal pain.  Objective: Filed Vitals:   01/11/15 0511  BP: 135/64  Pulse: 75  Temp: 97.4 F (36.3 C)  Resp: 16    Intake/Output Summary (Last 24 hours) at 01/11/15 1116 Last data filed at 01/11/15 0518  Gross per 24 hour  Intake    480 ml  Output    350 ml  Net    130 ml   Filed Weights   01/09/15 0251 01/10/15 0456 01/11/15 0511  Weight: 53.4 kg (117 lb 11.6 oz) 55.6 kg (122 lb 9.2 oz) 54.93 kg (121 lb 1.6 oz)  Exam:  Alert. No distress. Lungs revealed diminished air entry at the bases without any wheezing, crackles or rhonchi. S1, S2 is normal, regular. No S3, S4. No rubs, murmurs or bruit. Minimal pedal edema. Abdomen is soft. Nontender. Nondistended. No masses or  organomegaly. Bowel sounds are present. Alert. Somewhat distracted. Oriented to place, person. No focal neurological deficits are noted.   Data Reviewed: Basic Metabolic Panel:  Recent Labs Lab 01/08/15 1817 01/08/15 1834 01/10/15 1355 01/11/15 0327  NA 147* 148* 139 141  K 3.7 3.6 4.0 2.8*  CL 113* 112* 111 110  CO2 18*  --  17* 21*  GLUCOSE 149* 147* 118* 91  BUN 49* 46* 40* 27*  CREATININE 1.50* 1.40* 1.30* 1.09*  CALCIUM 9.3  --  8.3* 8.1*  MG  --   --   --  2.0   Liver Function Tests:  Recent Labs Lab 01/08/15 1817  AST 58*  ALT 47  ALKPHOS 76  BILITOT 0.7  PROT 7.6  ALBUMIN 3.4*   CBC:  Recent Labs Lab 01/08/15 1817 01/08/15 1834 01/11/15 0327  WBC 3.3*  --  3.7*  NEUTROABS 2.6  --  2.7  HGB 10.3* 12.6 9.4*  HCT 32.1* 37.0 29.5*  MCV 100.9*  --  101.0*  PLT 263  --  213   Cardiac Enzymes:  Recent Labs Lab 01/08/15 2327 01/09/15 0326 01/09/15 1100  TROPONINI 0.16* 0.16* 0.16*   BNP (last 3 results)  Recent Labs  09/02/14 1835 01/09/15 0326  BNP 1502.4* >4500.0*     Recent Results (from the past 240 hour(s))  Blood Culture (routine x 2)     Status: None (Preliminary result)   Collection Time: 01/08/15  6:16 PM  Result Value Ref Range Status   Specimen Description BLOOD RIGHT FOREARM  Final   Special Requests BOTTLES DRAWN AEROBIC ONLY 4CC  Final   Culture NO GROWTH 2 DAYS  Final   Report Status PENDING  Incomplete  Blood Culture (routine x 2)     Status: None (Preliminary result)   Collection Time: 01/08/15  7:18 PM  Result Value Ref Range Status   Specimen Description BLOOD RIGHT ANTECUBITAL  Final   Special Requests BOTTLES DRAWN AEROBIC AND ANAEROBIC 5CC  Final   Culture NO GROWTH 2 DAYS  Final   Report Status PENDING  Incomplete  Urine culture     Status: None   Collection Time: 01/08/15  8:59 PM  Result Value Ref Range Status   Specimen Description URINE, CATHETERIZED  Final   Special Requests NONE  Final   Culture 1,000  COLONIES/mL INSIGNIFICANT GROWTH  Final   Report Status 01/10/2015 FINAL  Final  MRSA PCR Screening     Status: None   Collection Time: 01/09/15  2:58 AM  Result Value Ref Range Status   MRSA by PCR NEGATIVE NEGATIVE Final    Comment:        The GeneXpert MRSA Assay (FDA approved for NASAL specimens only), is one component of a comprehensive MRSA colonization surveillance program. It is not intended to diagnose MRSA infection nor to guide or monitor treatment for MRSA infections.      Studies: Nm Pulmonary Perf And Vent  01/09/2015   CLINICAL DATA:  Shortness of breath, nonproductive cough x1 day, history of hypertension, CHF. Companion chest radiograph from the previous day demonstrates right lower lung consolidation possible pneumonia.  EXAM: NUCLEAR MEDICINE VENTILATION - PERFUSION LUNG SCAN  TECHNIQUE: Ventilation images were obtained in multiple projections using  inhaled aerosol Tc-25m DTPA. Perfusion images were obtained in multiple projections after intravenous injection of Tc-80m MAA.  RADIOPHARMACEUTICALS:  40.2 Technetium-74m DTPA aerosol inhalation and 6.05 Technetium-60m MAA IV  COMPARISON:  Radiograph from previous day  FINDINGS: Ventilation: No focal ventilation defect.  Perfusion: Small subsegmental defects in the right middle lobe and superior segment right lower lobe. Otherwise physiologic distribution of radiopharmaceutical.  IMPRESSION: 1. Low likelihood ratio for pulmonary embolism.   Electronically Signed   By: Corlis Leak M.D.   On: 01/09/2015 11:36    Scheduled Meds: . acidophilus  1 capsule Oral Daily  . antiseptic oral rinse  7 mL Mouth Rinse BID  . aspirin EC  325 mg Oral Daily  . carvedilol  3.125 mg Oral BID WC  . cholecalciferol  5,000 Units Oral Daily  . dextromethorphan-guaiFENesin  1 tablet Oral BID  . docusate sodium  100 mg Oral BID  . enoxaparin (LOVENOX) injection  30 mg Subcutaneous Q24H  . feeding supplement (ENSURE ENLIVE)  237 mL Oral BID BM  .  ipratropium-albuterol  3 mL Nebulization TID  . l-methylfolate-B6-B12  1 tablet Oral Daily  . NIFEdipine  60 mg Oral Daily  . omega-3 acid ethyl esters  1 g Oral Daily  . pantoprazole  40 mg Oral Q1200  . piperacillin-tazobactam (ZOSYN)  IV  3.375 g Intravenous Q8H  . potassium chloride  40 mEq Oral Once  . simvastatin  20 mg Oral Daily  . vancomycin  500 mg Intravenous Q24H   Continuous Infusions: . dextrose 5 % and 0.45% NaCl 100 mL/hr at 01/09/15 1308     Kelvon Giannini 01/11/2015 11:16 AM  [phone]

## 2015-01-11 NOTE — Patient Outreach (Signed)
[facility]) Care Management  01/11/2015  Traci Hale 11-Nov-1924 [id number]   Referral from Charlesetta Shanks, RN to assign Community RN, assigned Emilia Beck, RN.  Thanks, Corrie Mckusick. Sharlee Blew [facility] Care Management [facility] CM Assistant Phone: [phone] Fax: [phone]

## 2015-01-11 NOTE — Plan of Care (Signed)
  Problem: Phase I Progression Outcomes Goal: Pain controlled with appropriate interventions Outcome: Progressing  No complaints of pain 

## 2015-01-11 NOTE — Progress Notes (Signed)
ANTIBIOTIC CONSULT NOTE - FOLLOW UP  Pharmacy Consult for Levaquin  Indication: HCAP  No Known Allergies  Patient Measurements: Height:  (157.5 cm) Weight: 121 lb 1.6 oz (54.93 kg) IBW/kg (Calculated) : 50.1  Vital Signs: Temp: 97.7 F (36.5 C) (10/10 1408) Temp Source: Oral (10/10 1408) BP: 120/72 mmHg (10/10 1408) Pulse Rate: 65 (10/10 1408)  Labs:  Recent Labs  01/08/15 1817 01/08/15 1834 01/08/15 2059 01/10/15 1355 01/11/15 0327  WBC 3.3*  --   --   --  3.7*  HGB 10.3* 12.6  --   --  9.4*  PLT 263  --   --   --  213  LABCREA  --   --  153.10  --   --   CREATININE 1.50* 1.40*  --  1.30* 1.09*   Estimated Creatinine Clearance: 28.2 mL/min (by C-G formula based on Cr of 1.09).  Microbiology: Recent Results (from the past 720 hour(s))  Blood Culture (routine x 2)     Status: None (Preliminary result)   Collection Time: 01/08/15  6:16 PM  Result Value Ref Range Status   Specimen Description BLOOD RIGHT FOREARM  Final   Special Requests BOTTLES DRAWN AEROBIC ONLY 4CC  Final   Culture NO GROWTH 3 DAYS  Final   Report Status PENDING  Incomplete  Blood Culture (routine x 2)     Status: None (Preliminary result)   Collection Time: 01/08/15  7:18 PM  Result Value Ref Range Status   Specimen Description BLOOD RIGHT ANTECUBITAL  Final   Special Requests BOTTLES DRAWN AEROBIC AND ANAEROBIC 5CC  Final   Culture NO GROWTH 3 DAYS  Final   Report Status PENDING  Incomplete  Urine culture     Status: None   Collection Time: 01/08/15  8:59 PM  Result Value Ref Range Status   Specimen Description URINE, CATHETERIZED  Final   Special Requests NONE  Final   Culture 1,000 COLONIES/mL INSIGNIFICANT GROWTH  Final   Report Status 01/10/2015 FINAL  Final  MRSA PCR Screening     Status: None   Collection Time: 01/09/15  2:58 AM  Result Value Ref Range Status   MRSA by PCR NEGATIVE NEGATIVE Final    Comment:        The GeneXpert MRSA Assay (FDA approved for NASAL  specimens only), is one component of a comprehensive MRSA colonization surveillance program. It is not intended to diagnose MRSA infection nor to guide or monitor treatment for MRSA infections.    Assessment:  Day # 4 Vancomycin and Zosyn for HCAP.  Change to PO Levaquin today.  Last Zosyn dose ~6am; has not had Vanc yet today.  Goal of Therapy:  appropriate Levaquin dose for renal function and infection  Plan:   Levaquin 750 mg PO q48hrs. First dose today.  Do not expect any need to adjust regimen.  Will follow for length of therapy.  Dennie Fetters, RPh Pager: [phone] 01/11/2015,3:40 PM

## 2015-01-11 NOTE — Care Management Important Message (Signed)
Important Message  Patient Details  Name: Traci Hale MRN: [id number] Date of Birth: 1924-06-22   Medicare Important Message Given:  Yes-second notification given    Orson Aloe 01/11/2015, 2:02 PM

## 2015-01-11 NOTE — Consult Note (Signed)
   [facility] CM Inpatient Consult   01/11/2015  REDELL BHANDARI 02/01/1925 [id number] Patient was evaluated for community based chronic disease management services with [facility] Care Management Program as a benefit of patient's [employer]. Spoke with patient and her daughter, Hughes Better, at bedside to explain [facility] Care Management services. Patient states, "I am in charge of my decisions, and I chose to live alone.  My granddaughter tries to help me out but, I prefer to be alone."  Patient states she plans to return home at this time. Patient is still quite short of breath and denies oxygen at home.  Patient consented to services but so short of breath she asked her daughter to sign her name.   Patient will receive post discharge transition of care call and will be evaluated for monthly home visits for assessments and disease process education.  Left contact information and [facility] literature at bedside. Made Inpatient Case Manager aware that [facility] Care Management following. Of note, [facility] Care Management services does not replace or interfere with any services that are arranged by inpatient case management or social work.  For additional questions or referrals please contact:   Charlesetta Shanks, RN BSN CCM [facility]  [phone] business mobile phone

## 2015-01-12 LAB — CBC
HCT: 32.9 % — ABNORMAL LOW (ref 36.0–46.0)
Hemoglobin: 10.8 g/dL — ABNORMAL LOW (ref 12.0–15.0)
MCH: 33.5 pg (ref 26.0–34.0)
MCHC: 32.8 g/dL (ref 30.0–36.0)
MCV: 102.2 fL — ABNORMAL HIGH (ref 78.0–100.0)
PLATELETS: 237 10*3/uL (ref 150–400)
RBC: 3.22 MIL/uL — AB (ref 3.87–5.11)
RDW: 15.6 % — ABNORMAL HIGH (ref 11.5–15.5)
WBC: 3.9 10*3/uL — AB (ref 4.0–10.5)

## 2015-01-12 LAB — BASIC METABOLIC PANEL
Anion gap: 9 (ref 5–15)
BUN: 20 mg/dL (ref 6–20)
CALCIUM: 8.4 mg/dL — AB (ref 8.9–10.3)
CO2: 23 mmol/L (ref 22–32)
CREATININE: 1.03 mg/dL — AB (ref 0.44–1.00)
Chloride: 109 mmol/L (ref 101–111)
GFR calc non Af Amer: 47 mL/min — ABNORMAL LOW (ref >60)
GFR, EST AFRICAN AMERICAN: 55 mL/min — AB (ref >60)
Glucose, Bld: 81 mg/dL (ref 65–99)
Potassium: 3.3 mmol/L — ABNORMAL LOW (ref 3.5–5.1)
SODIUM: 141 mmol/L (ref 135–145)

## 2015-01-12 MED ORDER — POTASSIUM CHLORIDE CRYS ER 20 MEQ PO TBCR
40.0000 meq | EXTENDED_RELEASE_TABLET | Freq: Once | ORAL | Status: AC
  Administered 2015-01-12: 40 meq via ORAL
  Filled 2015-01-12: qty 2

## 2015-01-12 MED ORDER — LEVOFLOXACIN 750 MG PO TABS: 750.0000 mg | ORAL_TABLET | ORAL | Status: DC

## 2015-01-12 MED ORDER — FUROSEMIDE 40 MG PO TABS: 40.0000 mg | ORAL_TABLET | Freq: Every day | ORAL | Status: AC

## 2015-01-12 MED ORDER — CARVEDILOL 3.125 MG PO TABS: 3.1250 mg | ORAL_TABLET | Freq: Two times a day (BID) | ORAL | Status: AC

## 2015-01-12 NOTE — Progress Notes (Signed)
CM talked to patient about DCP; pt plans to return home at discharge. Patient was active with [facility] for [facility] services and request to use them again. Dareen Piano with [facility] made aware of [facility]HC needs. VM left with Aram Beecham daughter regarding dcp; awaiting callback. Abelino Derrick [facility] [phone]

## 2015-01-12 NOTE — Discharge Instructions (Signed)
Pneumonia, Adult °Pneumonia is an infection of the lungs. There are different types of pneumonia. One type can develop while a person is in a hospital. A different type, called community-acquired pneumonia, develops in people who are not, or have not recently been, in the hospital or other health care facility.  °CAUSES °Pneumonia may be caused by bacteria, viruses, or funguses. Community-acquired pneumonia is often caused by Streptococcus pneumonia bacteria. These bacteria are often passed from one person to another by breathing in droplets from the cough or sneeze of an infected person. °RISK FACTORS °The condition is more likely to develop in: °· People who have chronic diseases, such as chronic obstructive pulmonary disease (COPD), asthma, congestive heart failure, cystic fibrosis, diabetes, or kidney disease. °· People who have early-stage or late-stage HIV. °· People who have sickle cell disease. °· People who have had their spleen removed (splenectomy). °· People who have poor dental hygiene. °· People who have medical conditions that increase the risk of breathing in (aspirating) secretions their own mouth and nose.   °· People who have a weakened immune system (immunocompromised). °· People who smoke. °· People who travel to areas where pneumonia-causing germs commonly exist. °· People who are around animal habitats or animals that have pneumonia-causing germs, including birds, bats, rabbits, cats, and farm animals. °SYMPTOMS °Symptoms of this condition include: °· A dry cough. °· A wet (productive) cough. °· Fever. °· Sweating. °· Chest pain, especially when breathing deeply or coughing. °· Rapid breathing or difficulty breathing. °· Shortness of breath. °· Shaking chills. °· Fatigue. °· Muscle aches. °DIAGNOSIS °Your health care provider will take a medical history and perform a physical exam. You may also have other tests, including: °· Imaging studies of your chest, including X-rays. °· Tests to check  your blood oxygen level and other blood gases. °· Other tests on blood, mucus (sputum), fluid around your lungs (pleural fluid), and urine. °If your pneumonia is severe, other tests may be done to identify the specific cause of your illness. °TREATMENT °The type of treatment that you receive depends on many factors, such as the cause of your pneumonia, the medicines you take, and other medical conditions that you have. For most adults, treatment and recovery from pneumonia may occur at home. In some cases, treatment must happen in a hospital. Treatment may include: °· Antibiotic medicines, if the pneumonia was caused by bacteria. °· Antiviral medicines, if the pneumonia was caused by a virus. °· Medicines that are given by mouth or through an IV tube. °· Oxygen. °· Respiratory therapy. °Although rare, treating severe pneumonia may include: °· Mechanical ventilation. This is done if you are not breathing well on your own and you cannot maintain a safe blood oxygen level. °· Thoracentesis. This procedure removes fluid around one lung or both lungs to help you breathe better. °HOME CARE INSTRUCTIONS °· Take over-the-counter and prescription medicines only as told by your health care provider. °¨ Only take cough medicine if you are losing sleep. Understand that cough medicine can prevent your body's natural ability to remove mucus from your lungs. °¨ If you were prescribed an antibiotic medicine, take it as told by your health care provider. Do not stop taking the antibiotic even if you start to feel better. °· Sleep in a semi-upright position at night. Try sleeping in a reclining chair, or place a few pillows under your head. °· Do not use tobacco products, including cigarettes, chewing tobacco, and e-cigarettes. If you need help quitting, ask your health care provider. °· Drink enough water to keep your urine clear   or pale yellow. This will help to thin out mucus secretions in your lungs. °PREVENTION °There are ways  that you can decrease your risk of developing community-acquired pneumonia. Consider getting a pneumococcal vaccine if: °· You are older than 79 years of age. °· You are older than 79 years of age and are undergoing cancer treatment, have chronic lung disease, or have other medical conditions that affect your immune system. Ask your health care provider if this applies to you. °There are different types and schedules of pneumococcal vaccines. Ask your health care provider which vaccination option is best for you. °You may also prevent community-acquired pneumonia if you take these actions: °· Get an influenza vaccine every year. Ask your health care provider which type of influenza vaccine is best for you. °· Go to the dentist on a regular basis. °· Wash your hands often. Use hand sanitizer if soap and water are not available. °SEEK MEDICAL CARE IF: °· You have a fever. °· You are losing sleep because you cannot control your cough with cough medicine. °SEEK IMMEDIATE MEDICAL CARE IF: °· You have worsening shortness of breath. °· You have increased chest pain. °· Your sickness becomes worse, especially if you are an older adult or have a weakened immune system. °· You cough up blood. °  °This information is not intended to replace advice given to you by your health care provider. Make sure you discuss any questions you have with your health care provider. °  °Document Released: 03/20/2005 Document Revised: 12/09/2014 Document Reviewed: 07/15/2014 °Elsevier Interactive Patient Education ©2016 [employer] ° °

## 2015-01-12 NOTE — Progress Notes (Signed)
IV removed per discharge order. Discharge instructions and scripts given and explained to daughter, Aram Beecham, with teach back. Discharged via wheelchair to daughter's care with NT present.

## 2015-01-12 NOTE — Discharge Summary (Signed)
[facility]  Physician Discharge Summary   Patient ID: Traci Hale MRN: [id number] DOB/AGE: 1924/09/07 81 y.o.  Admit date: 01/08/2015 Discharge date: 01/12/2015  PCP: Cain Saupe, MD  DISCHARGE DIAGNOSES:  Principal Problem:   HCAP (healthcare-associated pneumonia) Active Problems:   HTN (hypertension)   Hyperlipidemia   Sepsis (HCC)   Chronic combined systolic and diastolic heart failure, NYHA class 1 (HCC)   Leukopenia   Acute on chronic respiratory failure with hypoxia (HCC)   Protein-calorie malnutrition, severe (HCC)   CAP (community acquired pneumonia)   Elevated troponin   GERD (gastroesophageal reflux disease)   Acute encephalopathy   AKI (acute kidney injury) (HCC)   Hypernatremia   RECOMMENDATIONS FOR OUTPATIENT FOLLOW UP: 1. Patient and family refuse SNF placement 2. Home health has been ordered 3. Check renal function at follow-up   DISCHARGE CONDITION: fair  Diet recommendation: Dysphagia 3 diet with thin liquids  Filed Weights   01/10/15 0456 01/11/15 0511 01/12/15 0508  Weight: 55.6 kg (122 lb 9.2 oz) 54.93 kg (121 lb 1.6 oz) 54.2 kg (119 lb 7.8 oz)    INITIAL HISTORY: 81 y/o African-American female admitted in June for hip fracture, complicated by NSTEMI/AECHF at that admit, stage II pressure ulcer. Prior admission 06/2014 Strep Pna/h flu sepsis. CHF with last ECHO 09/03/14 EF 35-40%, PA Peak 56 mm hg. Admitted [facility] to [facility] 01/09/15 with apnea and sob. She was found to have healthcare associated pneumonia.   HOSPITAL COURSE:   Acute on chronic respiratory failure with hypoxia Patient's worsening shortness of breath and cough are likely caused by possible PNA as evidenced by chest x-ray. Respiratory failure has resolved. Check room air saturations prior to discharge.   Healthcare associated pneumonia Patient was started on vancomycin and Zosyn. Patient started improving. Patient was septic on admission with neutropenia and elevated  lactate 4.45. She is hemodynamically stable. PE ruled out with low probability VQ. Urine legionella and S. pneumococcal antigen and flu pcr neg. Blood cultures negative so far. She was changed over to oral antibiotics. She remains stable.  Hypernatremia Now resolved. It was likely due to dehydration.  Acute encephalopathy, toxic metabolic Most likely due to sepsis, hypernatremia may have also contributed. Now resolved.   Elevated trop and CAD Trop 0.15. No chest pain. Most likely due to demand ischemia secondary to sepsis. Pt had elevated trop in previous admission with the troponin up to 6.09 on 09/03/14. Cardiology was consulted, not a candidate for invasive ischemic evaluation. Cardiology recommended to continue to maximize medical care. Continue ASA, zocor. Patient was supposed to take coreg, but not taking this med. Added back at 3.125 dose on 10.8.  Mild AKI Likely due to prerenal secondary to dehydration and continuation of ACEI and diuretics. Improved with IV fluids. Medical renal disease noted on ultrasound. UA does not suggest infection.  Acute COPD exacerbation Seems to be improved.   Chronic combined systolic and diastolic heart failure, NYHA class 1  2-D echo on 09/03/14 showed EF of 35-40% with grade 2 diastolic dysfunction. Patient is on Lasix 40 mg twice a day. She was clinically dry at the time of admission. Lasix was held. She has gained some weight. Reinitiated at lower dose.  Hypokalemia Potassium will be repleted. Magnesium was normal.   Essential HTN The time of admission her lisinopril and Lasix was held due to acute renal failure. Renal function is improved. Okay to resume lisinopril. Continue nifedipine. Blood pressure is reasonably well controlled.  Hyperlipidemia Continue home medications: Zocor.  GERD: Protonix  Protein-calorie malnutrition, severe Ensure  Patient is improved. She was seen by physical and occupational therapy. Placement to SNF was  recommended. Discussed in detail with her daughter today. Patient would like to go home. Daughter would want her to come home. Daughter told me that there will be somebody with the patient at home. Since patient and family refuse placement to SNF, she will be discharged home today. Home health will be arranged.  Patient is DO NOT RESUSCITATE  PERTINENT LABS:  The results of significant diagnostics from this hospitalization (including imaging, microbiology, ancillary and laboratory) are listed below for reference.    Microbiology: Recent Results (from the past 240 hour(s))  Blood Culture (routine x 2)     Status: None (Preliminary result)   Collection Time: 01/08/15  6:16 PM  Result Value Ref Range Status   Specimen Description BLOOD RIGHT FOREARM  Final   Special Requests BOTTLES DRAWN AEROBIC ONLY 4CC  Final   Culture NO GROWTH 4 DAYS  Final   Report Status PENDING  Incomplete  Blood Culture (routine x 2)     Status: None (Preliminary result)   Collection Time: 01/08/15  7:18 PM  Result Value Ref Range Status   Specimen Description BLOOD RIGHT ANTECUBITAL  Final   Special Requests BOTTLES DRAWN AEROBIC AND ANAEROBIC 5CC  Final   Culture NO GROWTH 4 DAYS  Final   Report Status PENDING  Incomplete  Urine culture     Status: None   Collection Time: 01/08/15  8:59 PM  Result Value Ref Range Status   Specimen Description URINE, CATHETERIZED  Final   Special Requests NONE  Final   Culture 1,000 COLONIES/mL INSIGNIFICANT GROWTH  Final   Report Status 01/10/2015 FINAL  Final  MRSA PCR Screening     Status: None   Collection Time: 01/09/15  2:58 AM  Result Value Ref Range Status   MRSA by PCR NEGATIVE NEGATIVE Final    Comment:        The GeneXpert MRSA Assay (FDA approved for NASAL specimens only), is one component of a comprehensive MRSA colonization surveillance program. It is not intended to diagnose MRSA infection nor to guide or monitor treatment for MRSA infections.       Labs: Basic Metabolic Panel:  Recent Labs Lab 01/08/15 1817 01/08/15 1834 01/10/15 1355 01/11/15 0327 01/11/15 1625 01/12/15 0333  NA 147* 148* 139 141 139 141  K 3.7 3.6 4.0 2.8* 3.4* 3.3*  CL 113* 112* 111 110 108 109  CO2 18*  --  17* 21* 21* 23  GLUCOSE 149* 147* 118* 91 90 81  BUN 49* 46* 40* 27* 22* 20  CREATININE 1.50* 1.40* 1.30* 1.09* 1.15* 1.03*  CALCIUM 9.3  --  8.3* 8.1* 8.2* 8.4*  MG  --   --   --  2.0  --   --    Liver Function Tests:  Recent Labs Lab 01/08/15 1817  AST 58*  ALT 47  ALKPHOS 76  BILITOT 0.7  PROT 7.6  ALBUMIN 3.4*   CBC:  Recent Labs Lab 01/08/15 1817 01/08/15 1834 01/11/15 0327 01/12/15 0333  WBC 3.3*  --  3.7* 3.9*  NEUTROABS 2.6  --  2.7  --   HGB 10.3* 12.6 9.4* 10.8*  HCT 32.1* 37.0 29.5* 32.9*  MCV 100.9*  --  101.0* 102.2*  PLT 263  --  213 237   Cardiac Enzymes:  Recent Labs Lab 01/08/15 2327 01/09/15 0326 01/09/15 1100  TROPONINI 0.16*  0.16* 0.16*   BNP: BNP (last 3 results)  Recent Labs  09/02/14 1835 01/09/15 0326  BNP 1502.4* >4500.0*     IMAGING STUDIES Ct Head Wo Contrast  01/08/2015   CLINICAL DATA:  Mental status changes.  Confusion.  Weakness.  EXAM: CT HEAD WITHOUT CONTRAST  TECHNIQUE: Contiguous axial images were obtained from the base of the skull through the vertex without intravenous contrast.  COMPARISON:  06/10/2014  FINDINGS: The brain shows generalized atrophy. There chronic small-vessel ischemic changes throughout the white matter. No sign of acute infarction, mass lesion, hemorrhage, hydrocephalus or extra-axial collection. The calvarium is unremarkable. Sinuses are clear. There is atherosclerotic calcification of the major vessels at the base of the brain.  IMPRESSION: Atrophy and chronic small vessel disease. No acute or reversible finding.   Electronically Signed   By: Paulina Fusi M.D.   On: 01/08/2015 20:42   US Renal  01/08/2015   CLINICAL DATA:  Patient with history of acute  renal injury.  EXAM: RENAL / URINARY TRACT ULTRASOUND COMPLETE  COMPARISON:  None.  FINDINGS: Right Kidney:  Length: 10.3 cm. Diffusely increased in echogenicity. Mild renal cortical thinning. No hydronephrosis. Multiple cysts measuring up to 5 cm.  Left Kidney:  Length: 8.1 cm. Poorly visualized however no definite hydronephrosis. Diffusely increased in echogenicity. Renal cortical thinning. Multiple cysts measuring up to 5.2 cm.  Bladder:  The bladder is somewhat decompressed however there is suggestion of wall thickening.  IMPRESSION: No definite hydronephrosis although evaluation of the left kidney is somewhat limited.  Diffusely increased renal cortical echogenicity bilaterally suggestive of medical renal disease.  Suggestion of urinary bladder wall thickening which is nonspecific may be secondary to infection in the appropriate clinical setting. Recommend correlation with urinalysis.   Electronically Signed   By: Annia Belt M.D.   On: 01/08/2015 21:48   Nm Pulmonary Perf And Vent  01/09/2015   CLINICAL DATA:  Shortness of breath, nonproductive cough x1 day, history of hypertension, CHF. Companion chest radiograph from the previous day demonstrates right lower lung consolidation possible pneumonia.  EXAM: NUCLEAR MEDICINE VENTILATION - PERFUSION LUNG SCAN  TECHNIQUE: Ventilation images were obtained in multiple projections using inhaled aerosol Tc-64m DTPA. Perfusion images were obtained in multiple projections after intravenous injection of Tc-41m MAA.  RADIOPHARMACEUTICALS:  40.2 Technetium-80m DTPA aerosol inhalation and 6.05 Technetium-21m MAA IV  COMPARISON:  Radiograph from previous day  FINDINGS: Ventilation: No focal ventilation defect.  Perfusion: Small subsegmental defects in the right middle lobe and superior segment right lower lobe. Otherwise physiologic distribution of radiopharmaceutical.  IMPRESSION: 1. Low likelihood ratio for pulmonary embolism.   Electronically Signed   By: Corlis Leak  M.D.   On: 01/09/2015 11:36   Dg Chest Port 1 View  01/08/2015   CLINICAL DATA:  Nonproductive cough, onset today. Lethargy, labored breathing and confusion.  EXAM: PORTABLE CHEST 1 VIEW  COMPARISON:  09/06/2014  FINDINGS: There is new right base consolidation which could represent pneumonia. The left lung is clear. No large effusion is evident. Pulmonary vasculature is normal.  IMPRESSION: Right base consolidation, suspicious for pneumonia.   Electronically Signed   By: Ellery Plunk M.D.   On: 01/08/2015 18:37    DISCHARGE EXAMINATION: Filed Vitals:   01/12/15 0508 01/12/15 0817 01/12/15 0905 01/12/15 1027  BP: 141/75   147/85  Pulse: 83     Temp: 97.5 F (36.4 C)     TempSrc: Oral     Resp: 18     Height:  Weight: 54.2 kg (119 lb 7.8 oz)     SpO2: 98% 93% 96%    General appearance: alert, cooperative, appears stated age and no distress Resp: Coarse breath sounds bilaterally. No wheezing. Few crackles at the bases. Cardio: regular rate and rhythm, S1, S2 normal, no murmur, click, rub or gallop GI: soft, non-tender; bowel sounds normal; no masses,  no organomegaly Extremities: extremities normal, atraumatic, no cyanosis or edema   DISPOSITION: Home with home health  Discharge Instructions    AMB Referral to [facility] Care Management    Complete by:  As directed   Reason for consult:  Community nurse to follow up with transition back home  Diagnoses of:  COPD/ Pneumonia  Expected date of contact:  1-3 days (reserved for hospital discharges)  Please assign to community nurse for transition of care calls and assess for home visits.  Please ask community nurse to assess for social worker needs.  Patient may benefit from pcs services. Questions please call: Charlesetta Shanks, RN BSN CCM [facility] Liaison  [phone] business mobile phone     Call MD for:  difficulty breathing, headache or visual disturbances    Complete by:  As directed      Call MD for:  extreme  fatigue    Complete by:  As directed      Call MD for:  persistant dizziness or light-headedness    Complete by:  As directed      Call MD for:  persistant nausea and vomiting    Complete by:  As directed      Call MD for:  severe uncontrolled pain    Complete by:  As directed      Call MD for:  temperature >100.4    Complete by:  As directed      Discharge diet:    Complete by:  As directed   Dysphagia 3 diet     Discharge instructions    Complete by:  As directed   Please take your medications as prescribed.   You were cared for by a hospitalist during your hospital stay. If you have any questions about your discharge medications or the care you received while you were in the hospital after you are discharged, you can call the unit and asked to speak with the hospitalist on call if the hospitalist that took care of you is not available. Once you are discharged, your primary care physician will handle any further medical issues. Please note that NO REFILLS for any discharge medications will be authorized once you are discharged, as it is imperative that you return to your primary care physician (or establish a relationship with a primary care physician if you do not have one) for your aftercare needs so that they can reassess your need for medications and monitor your lab values. If you do not have a primary care physician, you can call [phone] for a physician referral.     Increase activity slowly    Complete by:  As directed            ALLERGIES: No Known Allergies   Current Discharge Medication List    START taking these medications   Details  carvedilol (COREG) 3.125 MG tablet Take 1 tablet (3.125 mg total) by mouth 2 (two) times daily with a meal. Qty: 60 tablet, Refills: 1    levofloxacin (LEVAQUIN) 750 MG tablet Take 1 tablet (750 mg total) by mouth every other day. For 5 more doses starting 01/14/15  Qty: 5 tablet, Refills: 0      CONTINUE these medications which have  CHANGED   Details  furosemide (LASIX) 40 MG tablet Take 1 tablet (40 mg total) by mouth daily. Qty: 30 tablet, Refills: 0      CONTINUE these medications which have NOT CHANGED   Details  aspirin EC 325 MG EC tablet Take 1 tablet (325 mg total) by mouth daily. Qty: 30 tablet, Refills: 0    Cholecalciferol (VITAMIN D-3) 5000 UNITS TABS Take 1 tablet by mouth daily.    Folic Acid-Vit B6-Vit B12 (FOLGARD) 0.8-10-0.115 MG TABS Take 1 tablet by mouth daily.    lisinopril (PRINIVIL,ZESTRIL) 2.5 MG tablet Take 1 tablet (2.5 mg total) by mouth daily. Qty: 30 tablet, Refills: 0    NIFEdipine (PROCARDIA-XL/ADALAT CC) 60 MG 24 hr tablet Take 1 tablet (60 mg total) by mouth daily. Qty: 30 tablet, Refills: 0    Omega 3-6-9 Fatty Acids (OMEGA-3 FUSION) LIQD Take 3.2 mLs by mouth daily.  omega 3 liquid    Probiotic Product (PROBIOTIC DAILY PO) Take 1 capsule by mouth daily.    simvastatin (ZOCOR) 20 MG tablet Take 20 mg by mouth daily.      STOP taking these medications     docusate sodium (COLACE) 100 MG capsule      HYDROcodone-acetaminophen (NORCO/VICODIN) 5-325 MG per tablet      ipratropium-albuterol (DUONEB) 0.5-2.5 (3) MG/3ML SOLN      polyethylene glycol (MIRALAX / GLYCOLAX) packet        Follow-up Information    Follow up with FULP, CAMMIE, MD. Schedule an appointment as soon as possible for a visit on 01/15/2015.   Specialty:  Family Medicine   Why:  post hospitalization follow up @ 10:45am confirmed w/ Angie   Contact information:   [address] [place] [address] [phone]       Follow up with [facility].   Specialty:  Home Health Services   Why:  They will do your home health care at your home   Contact information:   [address] [place] [address] [phone]       TOTAL DISCHARGE TIME: 35 minutes  [facility]  [facility] Pager [phone]  01/12/2015, 1:16 PM

## 2015-01-12 NOTE — Progress Notes (Signed)
Physical Therapy Treatment Patient Details Name: Traci Hale MRN: [id number] DOB: Jun 07, 1924 Today's Date: 01/12/2015    History of Present Illness Patient is an 81 yo female admitted 01/08/15 with cough, SOB, weakness, AMS.  Patient with HCAP.  PMH:  hypertension, hyperlipidemia, GERD, chronic systolic and diastolic congestive heart failure, chronic shortness of breath, CAD, anxiety    PT Comments    Pt unable to ambulate and only perform SPT.  Pt with 2-3/4 dyspnea with o2 at 96-100% on room air. Per SW, family and pt are refusing SNF.  If pt goes home will need HHPT and 24 hour S with ASSISTANCE needed for mobility.  Follow Up Recommendations  SNF     Equipment Recommendations  None recommended by PT    Recommendations for Other Services       Precautions / Restrictions Precautions Precautions: Fall Restrictions Weight Bearing Restrictions: No    Mobility  Bed Mobility Overal bed mobility: Needs Assistance Bed Mobility: Supine to Sit     Supine to sit: Mod assist     General bed mobility comments: Pt able to initiate, but needs A to get hips fully turned and for scooting to EOB. While sitting EOB, pt wanting to lie back down but did sit up with encouragement. o2 99-100% in supine prior to sitting EOB.  Transfers Overall transfer level: Needs assistance Equipment used: Rolling walker (2 wheeled) Transfers: Sit to/from [employer] Sit to Stand: Mod assist;+2 physical assistance Stand pivot transfers: Mod assist;+2 physical assistance       General transfer comment: SPT with RW and cues to complete full transfer before sitting.  o2 96% on room air after transfer.  Ambulation/Gait             General Gait Details: Unable to ambulate   Stairs            Wheelchair Mobility    Modified Rankin (Stroke Patients Only)       Balance   Sitting-balance support: Feet supported;Bilateral upper extremity supported Sitting  balance-Leahy Scale: Fair     Standing balance support: Bilateral upper extremity supported Standing balance-Leahy Scale: Poor                      Cognition Arousal/Alertness: Awake/alert Behavior During Therapy: WFL for tasks assessed/performed Overall Cognitive Status: No family/caregiver present to determine baseline cognitive functioning                      Exercises      General Comments General comments (skin integrity, edema, etc.): Pt sat EOB for several minutes with lean to the R due to wanting to return supine.  Pt reports minimal dizziness initially which decreased as she sat.      Pertinent Vitals/Pain Pain Assessment: No/denies pain    Home Living                      Prior Function            PT Goals (current goals can now be found in the care plan section) Acute Rehab PT Goals Patient Stated Goal: to go home Time For Goal Achievement: 01/23/15 Potential to Achieve Goals: Fair Progress towards PT goals: Progressing toward goals    Frequency  Min 2X/week    PT Plan Current plan remains appropriate    Co-evaluation             End of Session Equipment Utilized  During Treatment: Gait belt Activity Tolerance: Patient limited by fatigue Patient left: in chair;with call bell/phone within reach;with chair alarm set     Time: 1610-9604 PT Time Calculation (min) (ACUTE ONLY): 23 min  Charges:  $Therapeutic Activity: 23-37 mins                    G Codes:      [name] 01/12/2015, 11:55 AM

## 2015-01-13 ENCOUNTER — Other Ambulatory Visit: Payer: Self-pay

## 2015-01-13 LAB — CULTURE, BLOOD (ROUTINE X 2)
Culture: NO GROWTH
Culture: NO GROWTH

## 2015-01-13 NOTE — Patient Outreach (Signed)
Unsuccessful attempt made to contact patient via telephone for community care coordination. Phone answered by someone who indicated patient was not available. Will make another attempt to contact patient on October 13

## 2015-01-14 ENCOUNTER — Other Ambulatory Visit: Payer: Self-pay

## 2015-01-14 NOTE — Patient Outreach (Signed)
Call made to patient for transition of care. Lady answered telephone, name Traci Hale.  Traci Hale stated she is patient's great granddaughter and she stays with patient during the day.   Traci Hale states patient's primary care Mrs. Hale, has left for work and would not be available until 10 am.  Call made to Traci Hale, primary caregiver and patient's granddaughter.  Traci Hale was able to provide HIPPA identifiers by providing patient's date of birth and address.  Traci Hale stated she works from 1230 pm to 10 pm, however, there is someone available to assist with patient and would be available during the initial home visit scheduled for next week.  Traci Hale states Traci Hale stays with patient while she works.    Traci Hale states she is planning to move out of the state later in the year, however, one of the other granddaughters plan to move in to assume primary care of patient.    Plan: Home visit next week for assessment of community care needs.

## 2015-01-21 ENCOUNTER — Telehealth: Payer: Self-pay

## 2015-01-21 NOTE — Patient Outreach (Signed)
Contacted Meredith LeedsNicole Hairston, patient's primary caregiver, to schedule home visit for Tuesday, October 25 at 1230pm. Home visit scheduled for continuation of community care needs.

## 2015-01-26 ENCOUNTER — Other Ambulatory Visit: Payer: Self-pay

## 2015-01-26 NOTE — Patient Outreach (Signed)
[facility] Care Management  01/26/2015  Creed CopperOphelia G Twist 21-May-1924 [id number]   This RNCM arrived at patient's home for scheduled initial home visit. Knocked several times and ranged the doorbell. Also called Granddaughter and primary caregiver, [name], who informed this RNCM patient was in the home, she would not answer the door, stated her aunt would be there within 10 minutes.  This RNCM waited, while waiting the [facility] [place]. However, neither of us were able to get in the home.  Will make another attempt to arranged home visit on Friday, October 28.  Several attempts have been made to complete initial home visit.    Will try to schedule visit for earlier in the day as to coincide with Nicole's availability.

## 2015-02-12 ENCOUNTER — Other Ambulatory Visit: Payer: Self-pay

## 2015-02-12 NOTE — Patient Outreach (Signed)
Home visit tentatively scheduled for November 17 to assess for community care coordination. Awaiting return call from primary care provider, Meredith Leedsicole Hairston to confirm date.

## 2015-02-17 ENCOUNTER — Ambulatory Visit: Payer: Commercial Managed Care - HMO

## 2015-02-18 ENCOUNTER — Other Ambulatory Visit: Payer: Self-pay

## 2015-02-18 NOTE — Patient Outreach (Signed)
[facility] Care Management  02/18/2015  Creed CopperOphelia G Going [birth year] [id number]   Unsuccessful attempt made to contact patient by going by patient's home for scheduled home visit. There was no answer at the door, however, there was a burgandy car in driveway. This RNCM ranged doorbell several times, after a while this RNCM placed business card between storm door and door at front of house.    Will discharge patient from my caseload by sending Corrie MckusickLisa O. Moore a letter advising her of discharge and rationale being unable to maintain contact with patient.

## 2015-04-01 ENCOUNTER — Observation Stay (HOSPITAL_COMMUNITY)
Admission: EM | Admit: 2015-04-01 | Discharge: 2015-04-05 | Disposition: A | Discharge status: Home / Self Care | Payer: Commercial Managed Care - HMO | Attending: Internal Medicine | Admitting: Internal Medicine

## 2015-04-01 ENCOUNTER — Emergency Department (HOSPITAL_COMMUNITY): Payer: Commercial Managed Care - HMO

## 2015-04-01 ENCOUNTER — Encounter (HOSPITAL_COMMUNITY): Payer: Self-pay

## 2015-04-01 ENCOUNTER — Observation Stay (HOSPITAL_COMMUNITY): Payer: Commercial Managed Care - HMO

## 2015-04-01 DIAGNOSIS — J9621 Acute and chronic respiratory failure with hypoxia: Principal | ICD-10-CM | POA: Diagnosis present

## 2015-04-01 DIAGNOSIS — K219 Gastro-esophageal reflux disease without esophagitis: Secondary | ICD-10-CM | POA: Insufficient documentation

## 2015-04-01 DIAGNOSIS — R0602 Shortness of breath: Secondary | ICD-10-CM

## 2015-04-01 DIAGNOSIS — R0600 Dyspnea, unspecified: Secondary | ICD-10-CM

## 2015-04-01 DIAGNOSIS — E43 Unspecified severe protein-calorie malnutrition: Secondary | ICD-10-CM | POA: Diagnosis not present

## 2015-04-01 DIAGNOSIS — Z681 Body mass index (BMI) 19 or less, adult: Secondary | ICD-10-CM | POA: Diagnosis not present

## 2015-04-01 DIAGNOSIS — Z515 Encounter for palliative care: Secondary | ICD-10-CM | POA: Diagnosis not present

## 2015-04-01 DIAGNOSIS — R7989 Other specified abnormal findings of blood chemistry: Secondary | ICD-10-CM | POA: Diagnosis not present

## 2015-04-01 DIAGNOSIS — K921 Melena: Secondary | ICD-10-CM

## 2015-04-01 DIAGNOSIS — I130 Hypertensive heart and chronic kidney disease with heart failure and stage 1 through stage 4 chronic kidney disease, or unspecified chronic kidney disease: Secondary | ICD-10-CM | POA: Insufficient documentation

## 2015-04-01 DIAGNOSIS — Z87891 Personal history of nicotine dependence: Secondary | ICD-10-CM | POA: Diagnosis not present

## 2015-04-01 DIAGNOSIS — I5042 Chronic combined systolic (congestive) and diastolic (congestive) heart failure: Secondary | ICD-10-CM | POA: Diagnosis present

## 2015-04-01 DIAGNOSIS — Z79899 Other long term (current) drug therapy: Secondary | ICD-10-CM | POA: Diagnosis not present

## 2015-04-01 DIAGNOSIS — Z9114 Patient's other noncompliance with medication regimen: Secondary | ICD-10-CM | POA: Diagnosis not present

## 2015-04-01 DIAGNOSIS — I272 Other secondary pulmonary hypertension: Secondary | ICD-10-CM | POA: Diagnosis not present

## 2015-04-01 DIAGNOSIS — E785 Hyperlipidemia, unspecified: Secondary | ICD-10-CM | POA: Diagnosis not present

## 2015-04-01 DIAGNOSIS — R627 Adult failure to thrive: Secondary | ICD-10-CM | POA: Diagnosis not present

## 2015-04-01 DIAGNOSIS — N179 Acute kidney failure, unspecified: Secondary | ICD-10-CM | POA: Insufficient documentation

## 2015-04-01 DIAGNOSIS — D649 Anemia, unspecified: Secondary | ICD-10-CM | POA: Insufficient documentation

## 2015-04-01 DIAGNOSIS — R778 Other specified abnormalities of plasma proteins: Secondary | ICD-10-CM | POA: Diagnosis present

## 2015-04-01 DIAGNOSIS — Z66 Do not resuscitate: Secondary | ICD-10-CM | POA: Diagnosis not present

## 2015-04-01 DIAGNOSIS — N183 Chronic kidney disease, stage 3 (moderate): Secondary | ICD-10-CM | POA: Insufficient documentation

## 2015-04-01 DIAGNOSIS — Z7982 Long term (current) use of aspirin: Secondary | ICD-10-CM | POA: Insufficient documentation

## 2015-04-01 DIAGNOSIS — R748 Abnormal levels of other serum enzymes: Secondary | ICD-10-CM | POA: Diagnosis not present

## 2015-04-01 DIAGNOSIS — I10 Essential (primary) hypertension: Secondary | ICD-10-CM | POA: Diagnosis present

## 2015-04-01 DIAGNOSIS — I252 Old myocardial infarction: Secondary | ICD-10-CM | POA: Insufficient documentation

## 2015-04-01 HISTORY — DX: Non-ST elevation (NSTEMI) myocardial infarction: I21.4

## 2015-04-01 HISTORY — DX: Pneumonia, unspecified organism: J18.9

## 2015-04-01 HISTORY — DX: Unspecified severe protein-calorie malnutrition: E43

## 2015-04-01 HISTORY — DX: Unspecified atrial fibrillation: I48.91

## 2015-04-01 LAB — CBC
HEMATOCRIT: 34 % — AB (ref 36.0–46.0)
HEMATOCRIT: 31.5 % — AB (ref 36.0–46.0)
HEMOGLOBIN: 10.9 g/dL — AB (ref 12.0–15.0)
Hemoglobin: 10.3 g/dL — ABNORMAL LOW (ref 12.0–15.0)
MCH: 31.9 pg (ref 26.0–34.0)
MCH: 32.2 pg (ref 26.0–34.0)
MCHC: 32.1 g/dL (ref 30.0–36.0)
MCHC: 32.7 g/dL (ref 30.0–36.0)
MCV: 99.4 fL (ref 78.0–100.0)
MCV: 98.4 fL (ref 78.0–100.0)
Platelets: 323 10*3/uL (ref 150–400)
Platelets: 320 10*3/uL (ref 150–400)
RBC: 3.42 MIL/uL — AB (ref 3.87–5.11)
RBC: 3.2 MIL/uL — ABNORMAL LOW (ref 3.87–5.11)
RDW: 17 % — ABNORMAL HIGH (ref 11.5–15.5)
RDW: 16.9 % — AB (ref 11.5–15.5)
WBC: 3.8 10*3/uL — ABNORMAL LOW (ref 4.0–10.5)
WBC: 4.2 10*3/uL (ref 4.0–10.5)

## 2015-04-01 LAB — APTT: aPTT: 31 seconds (ref 24–37)

## 2015-04-01 LAB — BASIC METABOLIC PANEL
ANION GAP: 17 — AB (ref 5–15)
BUN: 21 mg/dL — ABNORMAL HIGH (ref 6–20)
CHLORIDE: 107 mmol/L (ref 101–111)
CO2: 20 mmol/L — AB (ref 22–32)
Calcium: 9.3 mg/dL (ref 8.9–10.3)
Creatinine, Ser: 1.4 mg/dL — ABNORMAL HIGH (ref 0.44–1.00)
GFR calc non Af Amer: 32 mL/min — ABNORMAL LOW (ref >60)
GFR, EST AFRICAN AMERICAN: 38 mL/min — AB (ref >60)
GLUCOSE: 169 mg/dL — AB (ref 65–99)
POTASSIUM: 4.1 mmol/L (ref 3.5–5.1)
Sodium: 144 mmol/L (ref 135–145)

## 2015-04-01 LAB — TROPONIN I: TROPONIN I: 0.12 ng/mL — AB (ref <0.031)

## 2015-04-01 LAB — PROTIME-INR
INR: 1.44 (ref 0.00–1.49)
PROTHROMBIN TIME: 17.7 s — AB (ref 11.6–15.2)

## 2015-04-01 LAB — OCCULT BLOOD X 1 CARD TO LAB, STOOL: FECAL OCCULT BLD: NEGATIVE

## 2015-04-01 LAB — I-STAT TROPONIN, ED: Troponin i, poc: 0.11 ng/mL (ref 0.00–0.08)

## 2015-04-01 LAB — D-DIMER, QUANTITATIVE (NOT AT ARMC): D DIMER QUANT: 2.17 ug{FEU}/mL — AB (ref 0.00–0.50)

## 2015-04-01 LAB — BRAIN NATRIURETIC PEPTIDE

## 2015-04-01 MED ORDER — POLYETHYLENE GLYCOL 3350 17 G PO PACK: 17.0000 g | PACK | Freq: Every day | ORAL | Status: DC | PRN

## 2015-04-01 MED ORDER — SODIUM CHLORIDE 0.9 % IJ SOLN
3.0000 mL | Freq: Two times a day (BID) | INTRAMUSCULAR | Status: DC
  Administered 2015-04-02 – 2015-04-05 (×8): 3 mL via INTRAVENOUS

## 2015-04-01 MED ORDER — DOCUSATE SODIUM 100 MG PO CAPS
100.0000 mg | ORAL_CAPSULE | Freq: Two times a day (BID) | ORAL | Status: DC
  Administered 2015-04-02 – 2015-04-05 (×7): 100 mg via ORAL
  Filled 2015-04-01 (×7): qty 1

## 2015-04-01 MED ORDER — ONDANSETRON HCL 4 MG/2ML IJ SOLN: 4.0000 mg | Freq: Four times a day (QID) | INTRAMUSCULAR | Status: DC | PRN

## 2015-04-01 MED ORDER — FOLIC ACID-VIT B6-VIT B12 2.5-25-1 MG PO TABS
1.0000 | ORAL_TABLET | Freq: Every day | ORAL | Status: DC
Start: 2015-04-01 — End: 2015-04-05
  Administered 2015-04-02 – 2015-04-05 (×4): 1 via ORAL
  Filled 2015-04-01 (×5): qty 1

## 2015-04-01 MED ORDER — ONDANSETRON HCL 4 MG PO TABS: 4.0000 mg | ORAL_TABLET | Freq: Four times a day (QID) | ORAL | Status: DC | PRN

## 2015-04-01 MED ORDER — IOHEXOL 350 MG/ML SOLN
60.0000 mL | Freq: Once | INTRAVENOUS | Status: AC | PRN
  Administered 2015-04-01: 60 mL via INTRAVENOUS

## 2015-04-01 MED ORDER — METOPROLOL TARTRATE 12.5 MG HALF TABLET: 12.5000 mg | ORAL_TABLET | Freq: Four times a day (QID) | ORAL | Status: DC | PRN

## 2015-04-01 MED ORDER — ACETAMINOPHEN 650 MG RE SUPP: 650.0000 mg | Freq: Four times a day (QID) | RECTAL | Status: DC | PRN

## 2015-04-01 MED ORDER — ENSURE ENLIVE PO LIQD: 237.0000 mL | Freq: Two times a day (BID) | ORAL | Status: DC

## 2015-04-01 MED ORDER — PANTOPRAZOLE SODIUM 40 MG PO TBEC
40.0000 mg | DELAYED_RELEASE_TABLET | Freq: Two times a day (BID) | ORAL | Status: DC
  Administered 2015-04-02 – 2015-04-05 (×7): 40 mg via ORAL
  Filled 2015-04-01 (×7): qty 1

## 2015-04-01 MED ORDER — ACETAMINOPHEN 325 MG PO TABS
650.0000 mg | ORAL_TABLET | Freq: Four times a day (QID) | ORAL | Status: DC | PRN
Start: 2015-04-01 — End: 2015-04-05

## 2015-04-01 MED ORDER — SENNA 8.6 MG PO TABS
1.0000 | ORAL_TABLET | Freq: Two times a day (BID) | ORAL | Status: DC
  Administered 2015-04-02 – 2015-04-05 (×7): 8.6 mg via ORAL
  Filled 2015-04-01 (×7): qty 1

## 2015-04-01 MED ORDER — OMEGA-3 FUSION PO LIQD
3.2000 mL | Freq: Every day | ORAL | Status: DC
Start: 2015-04-01 — End: 2015-04-01

## 2015-04-01 NOTE — ED Notes (Signed)
Called main lab, spoke to rea to have d-dimer added on.

## 2015-04-01 NOTE — ED Notes (Signed)
Pt states granddaughter has moved [place] with her 3 children and they are tearing up her house and she has been watching children. Dr Rosalia Hammersay aware

## 2015-04-01 NOTE — ED Notes (Signed)
Attempted report 

## 2015-04-01 NOTE — ED Notes (Signed)
hospitalist paged in regards to d-dimer result

## 2015-04-01 NOTE — ED Notes (Signed)
Pt arrives ems from home with 2 day hx of shob. Hx chf.

## 2015-04-01 NOTE — H&P (Addendum)
[facility] History and Physical  Traci Hale:[id number] DOB: [birth year] DOA: 04/01/2015  Referring physician:  Margarita Grizzle PCP:  Cain Saupe, MD   Chief Complaint:  Shortness of breath  HPI:  The patient is a 81 y.o. year-old female with history of diastolic and systolic congestive heart failure, CAD being medically managed, hypertension, CKD stage III, GERD, recurrent pneumonia, and severe protein calorie malnutrition.  She was hospitalized in March 2016 with community-acquired pneumonia with strep pneumonia bacteremia, which was complicated by atrial fibrillation with RVR. She was not started on anticoagulation secondary to fall risk. She was hospitalized in June 2016 for a fall resulting in a femoral neck fracture, hospitalization complicated by NSTEMI and respiratory failure secondary to combined systolic and diastolic heart failure.  In October, she was treated for healthcare associated pneumonia and had an elevated troponin and a negative VQ scan. Since that time she was discharged to home where she lives with her granddaughter and great granddaughter.  She ambulates minimally with a walker and her only prescription medication is as-needed lasix.  She had increased SOB today and family called EMS.  The patient and family deny that she has had fevers, sinus congestion, cough, chest pains, chest tightness, nausea, vomiting.  She does not weigh herself, but her granddaughter checks her left ankle for swelling and gives her a dose of lasix if swollen and she has required two doses in the last week.  Dyspnea improves with lasix.  Foot was not more swollen today, however.  Patient reports two pitch black stools, one yesterday and one today, which has never happened before.  She took one baby dose of ibuprofen last week, but no other NSAIDS.  She no longer takes a daily aspirin.  No history of gastritis or PUD.    In the emergency department, her vital signs were notable for mild  tachycardia to 110, blood pressure mildly elevated 166/109, mildly tachypnea up to 32 with oxygen saturations of 100% on room air.  EKG demonstrated sinus tachycardia with inverted T waves in the inferolateral leads which are more pronounced on her previous EKGs from earlier this year. Her point-of-care troponin was 0.11, similar to her previous point-of-care troponins during her last 2 admissions.  Her CBC and BMP are near the patient's baseline including her hemoglobin, however creatinine is mildly higher than her baseline of 1.1, currently 1.4.  Her BUN is at baseline.  Her chest x-ray demonstrated some interstitial thickening but no pleural effusions, pulmonary edema, or infiltrates.  Occult stool is pending.    Review of Systems:  General:  Denies fevers, chills HEENT:  Denies changes to hearing and vision, rhinorrhea, sinus congestion, sore throat CV:  Denies chest pain and palpitations, no lower extremity edema today. PULM:  Positive SOB but not wheezing or cough.   GI:  Denies nausea, vomiting, constipation, diarrhea.   GU:  Denies dysuria but has frequency, urgency with incontinence ENDO:  Denies polyuria, polydipsia.   HEME:  Denies hematemesis, abnormal bruising or bleeding.  Melena x 2 LYMPH:  Denies lymphadenopathy.   MSK:  Denies arthralgias, myalgias.   DERM:  Denies skin rash or ulcer.   NEURO:  Denies focal numbness, weakness, slurred speech, confusion, facial droop.  PSYCH:  Denies anxiety and depression.    Past Medical History  Diagnosis Date  . Hypertension   . CHF (congestive heart failure) (HCC)   . Hyperlipidemia   . Heart murmur   . Shortness of breath   . GERD (  gastroesophageal reflux disease)    Past Surgical History  Procedure Laterality Date  . Leg surgery    . Total hip arthroplasty Right 09/06/2014    Procedure: HEMI HIP ARTHROPLASTY ANTERIOR APPROACH;  Surgeon: Kathryne Hitchhristopher Y Blackman, MD;  Location: [facility];  Service: Orthopedics;  Laterality: Right;    Social History:  reports that she has quit smoking. She has never used smokeless tobacco. She reports that she does not drink alcohol or use illicit drugs. Lives with granddaughter and greatgranddaughter  No Known Allergies  Family History  Problem Relation Age of Onset  . Hypertension Father   . Breast cancer Daughter      Prior to Admission medications   Medication Sig Start Date End Date Taking? Authorizing Provider  aspirin EC 325 MG EC tablet Take 1 tablet (325 mg total) by mouth daily. Patient taking differently: Take 325 mg by mouth daily as needed for pain.  09/08/14   Kathryne Hitchhristopher Y Blackman, MD  carvedilol (COREG) 3.125 MG tablet Take 1 tablet (3.125 mg total) by mouth 2 (two) times daily with a meal. 01/12/15   Osvaldo ShipperGokul Krishnan, MD  Cholecalciferol (VITAMIN D-3) 5000 UNITS TABS Take 1 tablet by mouth daily.    Historical Provider, MD  Folic Acid-Vit B6-Vit B12 (FOLGARD) 0.8-10-0.115 MG TABS Take 1 tablet by mouth daily.    Historical Provider, MD  furosemide (LASIX) 40 MG tablet Take 1 tablet (40 mg total) by mouth daily. 01/12/15   Osvaldo ShipperGokul Krishnan, MD  levofloxacin (LEVAQUIN) 750 MG tablet Take 1 tablet (750 mg total) by mouth every other day. For 5 more doses starting 01/14/15 01/12/15   Osvaldo ShipperGokul Krishnan, MD  lisinopril (PRINIVIL,ZESTRIL) 2.5 MG tablet Take 1 tablet (2.5 mg total) by mouth daily. 09/09/14   Jerald KiefStephen K Chiu, MD  NIFEdipine (PROCARDIA-XL/ADALAT CC) 60 MG 24 hr tablet Take 1 tablet (60 mg total) by mouth daily. 09/09/14   Jerald KiefStephen K Chiu, MD  Omega 3-6-9 Fatty Acids (OMEGA-3 FUSION) LIQD Take 3.2 mLs by mouth daily. 1000mg  omega 3 liquid    Historical Provider, MD  Probiotic Product (PROBIOTIC DAILY PO) Take 1 capsule by mouth daily.    Historical Provider, MD  simvastatin (ZOCOR) 20 MG tablet Take 20 mg by mouth daily.    Historical Provider, MD   Physical Exam: Filed Vitals:   04/01/15 1700 04/01/15 1741 04/01/15 1747 04/01/15 1749  BP: 145/105 166/109  149/109   Pulse: 105 109  100  Temp:   98 F (36.7 C) 98 F (36.7 C)  TempSrc:   Oral   Resp: 17     SpO2: 99% 100%  100%     General:  Adult female, cachectic, frail-appearing  Eyes:  PERRL, anicteric, non-injected.    ENT:  Nares clear.  OP clear, non-erythematous without plaques or exudates.  MMM.  Neck:  Supple without TM or JVD.    Lymph:  No cervical, supraclavicular, or submandibular LAD.  Cardiovascular:  Tachycardic RR, normal S1, S2, with 2/6 systolic murmur.  2+ pulses, warm extremities  Respiratory:  No wheezes, rales, or rhonchi, but becomes tachypneic when talking and needs to rest  Abdomen:  NABS.  Soft, ND, fullness felt along the descending colon.  Skin:  No rashes or focal lesions.  Musculoskeletal:  Normal bulk and tone.  No LE edema.  Psychiatric:  A & O to person.  Appropriate affect.  Neurologic:  CN 3-12 intact.  4/5 strength throughout.  Sensation intact.  Labs on Admission:  Basic Metabolic Panel:  Recent Labs Lab 04/01/15 1615  NA 144  K 4.1  CL 107  CO2 20*  GLUCOSE 169*  BUN 21*  CREATININE 1.40*  CALCIUM 9.3   Liver Function Tests: No results for input(s): AST, ALT, ALKPHOS, BILITOT, PROT, ALBUMIN in the last 168 hours. No results for input(s): LIPASE, AMYLASE in the last 168 hours. No results for input(s): AMMONIA in the last 168 hours. CBC:  Recent Labs Lab 04/01/15 1615  WBC 3.8*  HGB 10.9*  HCT 34.0*  MCV 99.4  PLT 323   Cardiac Enzymes: No results for input(s): CKTOTAL, CKMB, CKMBINDEX, TROPONINI in the last 168 hours.  BNP (last 3 results)  Recent Labs  09/02/14 1835 01/09/15 0326  BNP 1502.4* >4500.0*    ProBNP (last 3 results) No results for input(s): PROBNP in the last 8760 hours.  CBG: No results for input(s): GLUCAP in the last 168 hours.  Radiological Exams on Admission: Dg Chest 2 View  04/01/2015  CLINICAL DATA:  Initial encounter for two-day history of worsening shortness of breath. EXAM: CHEST  2  VIEW COMPARISON:  01/08/2015. FINDINGS: AP and lateral views of the chest show slight rightward patient rotation on the frontal projection. The cardio pericardial silhouette is enlarged. Wall calcification in the ascending aorta suggests coronal diameter approaching 4 cm. Lungs are hyperexpanded. Interstitial markings are diffusely coarsened with chronic features. No evidence for airspace pulmonary edema or pleural effusion. No focal airspace consolidation. Bones are diffusely demineralized. IMPRESSION: Hyperexpansion with cardiomegaly and underlying chronic interstitial changes. No acute cardiopulmonary process. Calcified ascending aorta which is borderline aneurysmal. Electronically Signed   By: Kennith Center M.D.   On: 04/01/2015 17:27    EKG: sinus tachycardia with inverted T waves in the inferolateral leads which are more pronounced on her previous EKGs from earlier this year  Assessment/Plan Active Problems:   * No active hospital problems. *  ---  Acute respiratory failure with hypoxia which resolved in the emergency department.  I suspect that she has intermittent dyspnea at home secondary to her severe protein calorie malnutrition, deconditioning, underlying chronic heart failure. She does not appear to be volume overloaded today on exam or on chest x-ray. BNP is ordered and pending. She has some worsening T-wave inversions in the inferolateral leads, however, her troponin is near its baseline. There is no evidence of pneumonia on chest x-ray. She had a VQ scan done secondary to elevated d-dimer and October of this year. Because I do not have another good explanation for her worsening dyspnea today, and because she is bed bound most of the time, it is possible that she did have a pulmonary embolism. I am concerned about starting anticoagulation on her however, because of her reported melena-like stools and because she was deemed to be not a good candidate for anticoagulation several times over  the last year due to falls. -  Patient is DO NOT RESUSCITATE and patient and family would like to focus on comfort to some degree, however the granddaughter is discussing the goals of care with extended family before making a final decision about level of aggressiveness about work up and procedures -  Palliative care consult -  D-dimer, and consider VQ scan if elevated to greater than 880 -  Cautious use of anticoagulation pending workup for GI bleed -  I do not think that checking additional troponins were monitoring on telemetry will be of great benefit to the patient given she is noncompliant with all of her heart medications including  her antihypertensives, her cholesterol medication, and her aspirin, and cardiology has evaluated her repeatedly and determined that she is not a candidate for any interventions.  I am also hesitant to start her on a prophylactic aspirin at this point since occult stool is pending.  Until family makes a decision about level of aggressiveness, however, I will cycle troponins and place on telemetry.   -  Start aspirin if occult stool negative  Possible melena with normocytic anemia, patient smells like melena on exam however her BUN is not particularly elevated -  Occult stool -  Hold NSAIDs -  Start twice a day PPI -  Iron studies, folate, TSH, vitamin B12 -  Repeat CBC with next troponin and in a.m. -  Not a good candidate for procedures given frailty.    Mild elevation in creatinine however her GFR is still in the stage III CKD range -  Minimize nephrotoxins and renally dose medications  Chronic combined systolic and diastolic heart failure, NYHA class 1 , does not appear volume overloaded -  Follow-up BNP -  2-D echo on 09/03/14 showed EF of 35-40% with grade 2 diastolic dysfunction.  -  Daily weights, strict ins and outs  -  Dose Lasix as needed for edema or worsening dyspnea  Essential HTN, given her age and comorbidities her goal systolic blood pressure  should be around 150, and she is near goal off of her previously prescribed blood pressure medications -  Will not resume any blood pressure medications at this time -  Start prn metoprolol  Hyperlipidemia, noncompliant with Zocor  Protein-calorie malnutrition, severe -  Regular diet with supplements -  Nutrition consult  Diet:  Regular Access:  PIV IVF:  off Proph:  SCDs  Code Status: DNR Family Communication: patient, granddaughter and great-granddaughter Disposition Plan: Admit to telemetry  Time spent: 60 min Renae Fickle [facility] Pager [phone]  If 7PM-7AM, please contact night-coverage [email or web address] Password [email or web address] 04/01/2015, 6:29 PM

## 2015-04-01 NOTE — ED Provider Notes (Signed)
CSN: [id number]     Arrival date & time 04/01/15  1544 History   First MD Initiated Contact with Patient 04/01/15 1557     Chief Complaint  Patient presents with  . Shortness of Breath     (Consider location/radiation/quality/duration/timing/severity/associated sxs/prior Treatment) HPI  This is an 81 year old female comes today complaining of worsening dyspnea over the past 2 days. She states that she is not getting dyspneic with exertion. She has a history of congestive heart failure. She states that she has had intermittent dyspnea over the past several years but feels it has been worse over the past couple days. She denies any associated symptoms such as cough, fever, chest pain, diaphoresis, or lateralized swelling or history of blood clot.  Past Medical History  Diagnosis Date  . Hypertension   . CHF (congestive heart failure) (HCC)   . Hyperlipidemia   . Heart murmur   . Shortness of breath   . GERD (gastroesophageal reflux disease)    Past Surgical History  Procedure Laterality Date  . Leg surgery    . Total hip arthroplasty Right 09/06/2014    Procedure: HEMI HIP ARTHROPLASTY ANTERIOR APPROACH;  Surgeon: Kathryne Hitch, MD;  Location: [facility];  Service: Orthopedics;  Laterality: Right;   Family History  Problem Relation Age of Onset  . Hypertension Father   . Breast cancer Daughter    Social History  Substance Use Topics  . Smoking status: Former Games developer  . Smokeless tobacco: Never Used     Comment: quit smoking in the 80"s   . Alcohol Use: No   OB History    No data available     Review of Systems  All other systems reviewed and are negative.     Allergies  Review of patient's allergies indicates no known allergies.  Home Medications   Prior to Admission medications   Medication Sig Start Date End Date Taking? Authorizing Provider  aspirin EC 325 MG EC tablet Take 1 tablet (325 mg total) by mouth daily. Patient taking differently: Take 325 mg by  mouth daily as needed for pain.  09/08/14   Kathryne Hitch, MD  carvedilol (COREG) 3.125 MG tablet Take 1 tablet (3.125 mg total) by mouth 2 (two) times daily with a meal. 01/12/15   Osvaldo Shipper, MD  Cholecalciferol (VITAMIN D-3) 5000 UNITS TABS Take 1 tablet by mouth daily.    Historical Provider, MD  Folic Acid-Vit B6-Vit B12 (FOLGARD) 0.8-10-0.115 MG TABS Take 1 tablet by mouth daily.    Historical Provider, MD  furosemide (LASIX) 40 MG tablet Take 1 tablet (40 mg total) by mouth daily. 01/12/15   Osvaldo Shipper, MD  levofloxacin (LEVAQUIN) 750 MG tablet Take 1 tablet (750 mg total) by mouth every other day. For 5 more doses starting 01/14/15 01/12/15   Osvaldo Shipper, MD  lisinopril (PRINIVIL,ZESTRIL) 2.5 MG tablet Take 1 tablet (2.5 mg total) by mouth daily. 09/09/14   Jerald Kief, MD  NIFEdipine (PROCARDIA-XL/ADALAT CC) 60 MG 24 hr tablet Take 1 tablet (60 mg total) by mouth daily. 09/09/14   Jerald Kief, MD  Omega 3-6-9 Fatty Acids (OMEGA-3 FUSION) LIQD Take 3.2 mLs by mouth daily.  omega 3 liquid    Historical Provider, MD  Probiotic Product (PROBIOTIC DAILY PO) Take 1 capsule by mouth daily.    Historical Provider, MD  simvastatin (ZOCOR) 20 MG tablet Take 20 mg by mouth daily.    Historical Provider, MD   BP 149/109 mmHg  Pulse  100  Temp(Src) 98 F (36.7 C) (Oral)  Resp 17  SpO2 100% Physical Exam  Constitutional: She is oriented to person, place, and time. She appears well-developed and well-nourished.  HENT:  Head: Normocephalic and atraumatic.  Right Ear: External ear normal.  Left Ear: External ear normal.  Nose: Nose normal.  Mouth/Throat: Oropharynx is clear and moist.  Eyes: Conjunctivae are normal. Pupils are equal, round, and reactive to light.  Neck: Normal range of motion. Neck supple.  Cardiovascular: Normal rate, regular rhythm, normal heart sounds and intact distal pulses.   Pulmonary/Chest: Effort normal and breath sounds normal.  Abdominal:  Soft. Bowel sounds are normal.  Musculoskeletal: Normal range of motion. She exhibits no edema or tenderness.  Neurological: She is alert and oriented to person, place, and time. She has normal reflexes.  Skin: Skin is warm and dry.  Psychiatric: She has a normal mood and affect. Her behavior is normal. Judgment and thought content normal.  Nursing note and vitals reviewed.   ED Course  Procedures (including critical care time) Labs Review Labs Reviewed  BASIC METABOLIC PANEL - Abnormal; Notable for the following:    CO2 20 (*)    Glucose, Bld 169 (*)    BUN 21 (*)    Creatinine, Ser 1.40 (*)    GFR calc non Af Amer 32 (*)    GFR calc Af Amer 38 (*)    Anion gap 17 (*)    All other components within normal limits  CBC - Abnormal; Notable for the following:    WBC 3.8 (*)    RBC 3.42 (*)    Hemoglobin 10.9 (*)    HCT 34.0 (*)    RDW 17.0 (*)    All other components within normal limits  I-STAT TROPOININ, ED - Abnormal; Notable for the following:    Troponin i, poc 0.11 (*)    All other components within normal limits    Imaging Review Dg Chest 2 View  04/01/2015  CLINICAL DATA:  Initial encounter for two-day history of worsening shortness of breath. EXAM: CHEST  2 VIEW COMPARISON:  01/08/2015. FINDINGS: AP and lateral views of the chest show slight rightward patient rotation on the frontal projection. The cardio pericardial silhouette is enlarged. Wall calcification in the ascending aorta suggests coronal diameter approaching 4 cm. Lungs are hyperexpanded. Interstitial markings are diffusely coarsened with chronic features. No evidence for airspace pulmonary edema or pleural effusion. No focal airspace consolidation. Bones are diffusely demineralized. IMPRESSION: Hyperexpansion with cardiomegaly and underlying chronic interstitial changes. No acute cardiopulmonary process. Calcified ascending aorta which is borderline aneurysmal. Electronically Signed   By: Kennith CenterEric  Mansell M.D.   On:  04/01/2015 17:27   I have personally reviewed and evaluated these images and lab results as part of my medical decision-making.   EKG Interpretation   Date/Time:  Thursday April 01 2015 15:49:53 EST Ventricular Rate:  111 PR Interval:  118 QRS Duration: 84 QT Interval:  381 QTC Calculation: 518 R Axis:   96 Text Interpretation:  Sinus tachycardia Ventricular premature complex  Right axis deviation Consider left ventricular hypertrophy Abnormal T,  consider ischemia, diffuse leads Prolonged QT interval Baseline wander in  lead(s) V6 Confirmed by Fareeha Evon MD, Duwayne HeckANIELLE ([id number]) on 04/01/2015 3:58:37 PM      MDM   Final diagnoses:  Dyspnea  Elevated troponin    81 year old female comes today complaining of dyspnea. Sats here are normal. However EKG has some T waves that are more inverted than previous  in her lateral leads. Troponin is elevated at 0.11. She has had elevated troponins in the past is unclear if this is significant for new ischemic injury. Patient has been stable here in the department resting comfortably with sats in the upper 90s but does become somewhat dyspneic with movement. Plan admission for further evaluation and cycle troponins  Patient has had an elevated d-dimer in the past and had a low probability VQ scan in October of this year. Discussed with Dr. Malachi Bonds and will admit to hospitalist service  Margarita Grizzle, MD 04/01/15 [phone]

## 2015-04-02 DIAGNOSIS — Z515 Encounter for palliative care: Secondary | ICD-10-CM | POA: Diagnosis not present

## 2015-04-02 DIAGNOSIS — R7989 Other specified abnormal findings of blood chemistry: Secondary | ICD-10-CM | POA: Diagnosis not present

## 2015-04-02 DIAGNOSIS — I5042 Chronic combined systolic (congestive) and diastolic (congestive) heart failure: Secondary | ICD-10-CM

## 2015-04-02 DIAGNOSIS — R0600 Dyspnea, unspecified: Secondary | ICD-10-CM | POA: Diagnosis not present

## 2015-04-02 DIAGNOSIS — J9621 Acute and chronic respiratory failure with hypoxia: Secondary | ICD-10-CM | POA: Diagnosis not present

## 2015-04-02 LAB — RENAL FUNCTION PANEL
ALBUMIN: 3.5 g/dL (ref 3.5–5.0)
ANION GAP: 15 (ref 5–15)
BUN: 28 mg/dL — AB (ref 6–20)
CO2: 23 mmol/L (ref 22–32)
Calcium: 9.5 mg/dL (ref 8.9–10.3)
Chloride: 106 mmol/L (ref 101–111)
Creatinine, Ser: 1.57 mg/dL — ABNORMAL HIGH (ref 0.44–1.00)
GFR calc Af Amer: 33 mL/min — ABNORMAL LOW (ref >60)
GFR calc non Af Amer: 28 mL/min — ABNORMAL LOW (ref >60)
GLUCOSE: 150 mg/dL — AB (ref 65–99)
PHOSPHORUS: 5.9 mg/dL — AB (ref 2.5–4.6)
Potassium: 5 mmol/L (ref 3.5–5.1)
SODIUM: 144 mmol/L (ref 135–145)

## 2015-04-02 LAB — CBC
HEMATOCRIT: 32 % — AB (ref 36.0–46.0)
HEMOGLOBIN: 10.3 g/dL — AB (ref 12.0–15.0)
MCH: 31.8 pg (ref 26.0–34.0)
MCHC: 32.2 g/dL (ref 30.0–36.0)
MCV: 98.8 fL (ref 78.0–100.0)
Platelets: 297 10*3/uL (ref 150–400)
RBC: 3.24 MIL/uL — ABNORMAL LOW (ref 3.87–5.11)
RDW: 16.9 % — ABNORMAL HIGH (ref 11.5–15.5)
WBC: 4.7 10*3/uL (ref 4.0–10.5)

## 2015-04-02 LAB — TROPONIN I
Troponin I: 0.15 ng/mL — ABNORMAL HIGH (ref <0.031)
Troponin I: 0.14 ng/mL — ABNORMAL HIGH (ref <0.031)

## 2015-04-02 LAB — IRON AND TIBC
IRON: 38 ug/dL (ref 28–170)
SATURATION RATIOS: 13 % (ref 10.4–31.8)
TIBC: 291 ug/dL (ref 250–450)
UIBC: 253 ug/dL

## 2015-04-02 LAB — FERRITIN: FERRITIN: 96 ng/mL (ref 11–307)

## 2015-04-02 LAB — MAGNESIUM: MAGNESIUM: 2.1 mg/dL (ref 1.7–2.4)

## 2015-04-02 LAB — FOLATE: Folate: 26.7 ng/mL (ref >5.9)

## 2015-04-02 LAB — TSH: TSH: 3.104 u[IU]/mL (ref 0.350–4.500)

## 2015-04-02 LAB — VITAMIN B12: Vitamin B-12: 377 pg/mL (ref 180–914)

## 2015-04-02 MED ORDER — ENSURE ENLIVE PO LIQD
237.0000 mL | Freq: Three times a day (TID) | ORAL | Status: DC
  Administered 2015-04-02 – 2015-04-05 (×7): 237 mL via ORAL

## 2015-04-02 MED ORDER — MORPHINE SULFATE (CONCENTRATE) 10 MG/0.5ML PO SOLN
2.5000 mg | ORAL | Status: DC | PRN
  Administered 2015-04-02: 2.6 mg via ORAL
  Filled 2015-04-02: qty 0.5

## 2015-04-02 NOTE — Progress Notes (Signed)
PROGRESS NOTE  Traci Hale ZOX:[id number] DOB: 1925-03-21 DOA: 04/01/2015 PCP: Cain SaupeFULP, CAMMIE, MD  HPI: 81 y.o. year-old female with history of diastolic and systolic congestive heart failure, CAD being medically managed, hypertension, CKD stage III, GERD, recurrent pneumonia, and severe protein calorie malnutrition. She was hospitalized several times this year for hip fracture HCAP, A. fib with RVR, NSTEMI. She has chronic shortness of breath, which gradually got worse in the last few days and decided to come to the ED.  Subjective / 24 H Interval events - She complains of mild shortness of breath, was able to be related to the bathroom and back this morning - She denies any chest pain, abdominal pain, nausea vomiting  Assessment/Plan: Active Problems:   HTN (hypertension)   Hyperlipidemia   Chronic combined systolic and diastolic heart failure, NYHA class 1 (HCC)   Acute on chronic respiratory failure with hypoxia (HCC)   Protein-calorie malnutrition, severe (HCC)   Elevated troponin   Dyspnea   Melena   Acute on chronic respiratory failure with hypoxia - This is likely multifactorial in the setting of failure to thrive, malnutrition, deconditioning, as well as underlying chronic heart failure. She had a CT angiogram overnight due to an elevated d-dimer which was negative for a PE. - CT scan showed pulmonary hypertension which is likely to explain part of her respiratory failure - She is satting 100% on 2 L   Possible melena - Fecal occult was negative, hemoglobin is stable this morning - Closely monitor H&H as well as stool for Homero FellersFrank bleeding   Acute on Chronic kidney disease stage III - Baseline creatinine 1.0-1.2, creatinine 1.4 and 1.5 this morning, she received IV contrast overnight for the CT angiogram  - We'll hold Lasix, will also avoid IV fluids due to heart failure  - Closely monitor renal function   Chronic combined systolic and diastolic heart failure - She  has no lower extremity edema, no crackles, clinically does not appear fluid overloaded - She was evaluated by cardiology in the past, recommended conservative medical management, not being candidate for invasive procedures - Patient is not taking her home medications instructed  Hyperlipidemia - She is noncompliant with her Zocor  Hypertension - Hold all medications for now  Severe protein calorie malnutrition - Regular diet  Goals of care - Overall patient's clinical condition has deteriorated the last 12 months, she's had multiple hospitalizations as well as Slow decline. She has heart failure, renal failure, chronic respiratory failure and overall she is very deconditioned and malnourished. Palliative care was consulted and following patient, there will be a family meeting this afternoon at 5 PM. Appreciate input   Diet: Diet regular Room service appropriate?: Yes; Fluid consistency:: Thin Fluids: none DVT Prophylaxis: SCD  Code Status: DNR Family Communication: no family bedside  Disposition Plan: remain inpatient   Barriers to discharge: goals of care  Consultants:  Palliative care   Procedures:  None   Antibiotics  Anti-infectives    None       Studies  Dg Chest 2 View  04/01/2015  CLINICAL DATA:  Initial encounter for two-day history of worsening shortness of breath. EXAM: CHEST  2 VIEW COMPARISON:  01/08/2015. FINDINGS: AP and lateral views of the chest show slight rightward patient rotation on the frontal projection. The cardio pericardial silhouette is enlarged. Wall calcification in the ascending aorta suggests coronal diameter approaching 4 cm. Lungs are hyperexpanded. Interstitial markings are diffusely coarsened with chronic features. No evidence for airspace pulmonary  edema or pleural effusion. No focal airspace consolidation. Bones are diffusely demineralized. IMPRESSION: Hyperexpansion with cardiomegaly and underlying chronic interstitial changes. No  acute cardiopulmonary process. Calcified ascending aorta which is borderline aneurysmal. Electronically Signed   By: Kennith Center M.D.   On: 04/01/2015 17:27   Ct Angio Chest Pe W/cm &/or Wo Cm  04/01/2015  CLINICAL DATA:  Subsequent encounter for 2 day history of shortness of breath with tachycardia and tachypnea EXAM: CT ANGIOGRAPHY CHEST WITH CONTRAST TECHNIQUE: Multidetector CT imaging of the chest was performed using the standard protocol during bolus administration of intravenous contrast. Multiplanar CT image reconstructions and MIPs were obtained to evaluate the vascular anatomy. CONTRAST:  60mL OMNIPAQUE IOHEXOL 350 MG/ML SOLN COMPARISON:  Chest x-ray from earlier the same day. FINDINGS: Mediastinum / Lymph Nodes: There is no axillary lymphadenopathy. No mediastinal lymphadenopathy. There is no hilar lymphadenopathy. Heart is markedly enlarged. No pericardial effusion. Coronary artery calcification is noted. Circumferential wall calcification is seen in the thoracic aorta with ascending aortic diameter maximally at 3.6 cm. Right main pulmonary artery measures 3.3 cm in diameter. Left main pulmonary artery measures 3.3 cm in diameter. There is no filling defect in the opacified pulmonary arteries to suggest the presence of an acute pulmonary embolus. Pulmonary arterial branches taper rapidly in the lungs. Lungs / Pleura: Bronchial wall thickening is evident. No focal airspace consolidation. No pulmonary edema or pleural effusion. 7 mm ground-glass nodule is seen in the left lower lobe on image 47 of series 507. There is some minimal atelectasis or scarring at the left base. Upper Abdomen:  Cysts are noted in the kidneys bilaterally. MSK / Soft Tissues:  Bones are diffusely demineralized. Review of the MIP images confirms the above findings. IMPRESSION: 1. No CT evidence of acute pulmonary embolus. 2. Enlargement of the main pulmonary arteries bilaterally with rapid tapering of pulmonary arterial  vasculature. Imaging features raise the question of pulmonary arterial hypertension. 3. 7 mm ground-glass nodule in the left lower lobe. Initial follow-up by chest CT without contrast is recommended in 3 months to confirm persistence. This recommendation follows the consensus statement: Recommendations for the Management of Subsolid Pulmonary Nodules Detected at CT: A Statement from the Fleischner Society as published in Radiology 2013; 266:304-317. Electronically Signed   By: Kennith Center M.D.   On: 04/01/2015 22:14    Objective  Filed Vitals:   04/01/15 2048 04/02/15 0000 04/02/15 0423 04/02/15 1135  BP: 154/99 149/99 137/98 134/94  Pulse: 110 112 107 103  Temp: 97.3 F (36.3 C) 97.9 F (36.6 C) 97.8 F (36.6 C) 97.4 F (36.3 C)  TempSrc: Oral Oral Oral Oral  Resp: Weight: 47.174 kg (104 lb)  47 kg (103 lb 9.9 oz)   SpO2: 100% 100% 99% 100%    Intake/Output Summary (Last 24 hours) at 04/02/15 1227 Last data filed at 04/02/15 1610  Gross per 24 hour  Intake    240 ml  Output      0 ml  Net    240 ml   Filed Weights   04/01/15 2048 04/02/15 0423  Weight: 47.174 kg (104 lb) 47 kg (103 lb 9.9 oz)    Exam:  GENERAL: NAD, frail appearing elderly female with purse lip breathing   HEENT: no scleral icterus, PERRL  NECK: supple, no LAD  LUNGS: CTA biL, no wheezing  HEART: RRR without MRG  ABDOMEN: soft, non tender  EXTREMITIES: no clubbing / cyanosis  NEUROLOGIC: non focal  Data Reviewed: Basic Metabolic Panel:  Recent Labs Lab 04/01/15 1615 04/02/15 0245 04/02/15 0920  NA 144 144  --   K 4.1 5.0  --   CL 107 106  --   CO2 20* 23  --   GLUCOSE 169* 150*  --   BUN 21* 28*  --   CREATININE 1.40* 1.57*  --   CALCIUM 9.3 9.5  --   MG  --   --  2.1  PHOS  --  5.9*  --    Liver Function Tests:  Recent Labs Lab 04/02/15 0245  ALBUMIN 3.5   CBC:  Recent Labs Lab 04/01/15 1615 04/01/15 2034 04/02/15 0920  WBC 3.8* 4.2 4.7  HGB 10.9*  10.3* 10.3*  HCT 34.0* 31.5* 32.0*  MCV 99.4 98.4 98.8  PLT 323 320 297   Cardiac Enzymes:  Recent Labs Lab 04/01/15 2033 04/02/15 0245 04/02/15 0920  TROPONINI 0.12* 0.15* 0.14*   BNP (last 3 results)  Recent Labs  09/02/14 1835 01/09/15 0326 04/01/15 2032  BNP 1502.4* >4500.0* >4500.0*    Scheduled Meds: . docusate sodium  100 mg Oral BID  . feeding supplement (ENSURE ENLIVE)  237 mL Oral BID BM  . Folic Acid-Vit B6-Vit B12  1 tablet Oral Daily  . pantoprazole  40 mg Oral BID AC  . senna  1 tablet Oral BID  . sodium chloride  3 mL Intravenous Q12H   Continuous Infusions:     Pamella Pert, MD [facility] Pager [phone]. If 7 PM - 7 AM, please contact night-coverage at [email or web address], password [facility] 04/02/2015, 12:27 PM

## 2015-04-02 NOTE — Progress Notes (Signed)
Pt had Troponin of 0.15, earlier it was 0.12, no s/s, notified MD, will continue to monitor, thanks Lavonda JumboMike F RN

## 2015-04-02 NOTE — Progress Notes (Signed)
Initial Nutrition Assessment  DOCUMENTATION CODES:   Severe malnutrition in context of chronic illness  INTERVENTION:  Provide Ensure Enlive po TID, each supplement provides 350 kcal and 20 grams of protein Provide snacks BID Encourage PO intake Downgrade diet to Dysphagia 3   NUTRITION DIAGNOSIS:   Inadequate oral intake related to poor appetite, chronic illness as evidenced by severe depletion of muscle mass, severe depletion of body fat, percent weight loss.   GOAL:   Patient will meet greater than or equal to 90% of their needs   MONITOR:   PO intake, Supplement acceptance, Labs, Weight trends, Skin, I & O's  REASON FOR ASSESSMENT:   Consult Assessment of nutrition requirement/status  ASSESSMENT:   81 y.o. year-old female with history of diastolic and systolic congestive heart failure, CAD being medically managed, hypertension, CKD stage III, GERD, recurrent pneumonia, and severe protein calorie malnutrition. She was hospitalized several times this year for hip fracture HCAP, A. fib with RVR, NSTEMI. She has chronic shortness of breath, which gradually got worse in the last few days and decided to come to the ED.  Pt very difficult to assess; her granddaughter/caregiver at bedside assisted in providing history. Pt states she has not been eating well. Per granddaughter, pt has been eating less and losing weight due to shortness of breath and poor appetite. Pt eats 1 boiled egg and a slice of toast with jelly in the morning and then drinks an Ensure in the afternoon. Daughter prepares dinner; per granddaughter, daughter told her pt has been eating very little at dinner. Per weight history, pt has lost from 119 lbs to 103 lbs within the past 3 months- 13.5% weight loss is severe for time frame. Pt has severe muscle and fat wasting per nutrition-focused physical exam.  Pt states that she likes Ensure and could drink it more than once a day. She has no teeth and would appreciate a  mechanical soft diet.  Labs: low hemoglobin, elevated phosphorus  Diet Order:  Diet regular Room service appropriate?: Yes; Fluid consistency:: Thin  Skin:  Reviewed, no issues  Last BM:  12/30  Height:   Ht Readings from Last 1 Encounters:  04/02/15 5\' 2"  (1.575 m)    Weight:   Wt Readings from Last 1 Encounters:  04/02/15 103 lb 9.9 oz (47 kg)    Ideal Body Weight:  50 kg  BMI:  Body mass index is 18.95 kg/(m^2).  Estimated Nutritional Needs:   Kcal:  1400-1600  Protein:  60-70 grams  Fluid:  1.4 L/day  EDUCATION NEEDS:   No education needs identified at this time  Dorothea Ogleeanne Raylin Winer RD, LDN Inpatient Clinical Dietitian Pager: [phone] After Hours Pager: [phone]

## 2015-04-02 NOTE — Consult Note (Signed)
Consultation Note Date: 04/02/2015   Patient Name: Traci Hale  DOB: 1924-08-28  MRN: [id number]  Age / Sex: 81 y.o., female  PCP: Antony Blackbird, MD Referring Physician: Caren Griffins, MD  Reason for Consultation: Establishing goals of care, Hospice Evaluation and Psychosocial/spiritual support    Clinical Assessment/Narrative: Pt is a 81 yo female with multiple medical problems who has been declining sharply this past year: cCHF EF 35%, a-fib with RVR, lung nodule, hip fx in June complicated by NSTEMI, poor baseline functional status, CKD III creatinine now 1.57. Pt has an elevated d-dimer 2.17 but VQ negative for PE, Hgb stable at 10.3, and fecal occult negative. Pt had reported black stools at home in conjunction with new onset of SHOB. She states the [facility] came on suddenly. She grabs her chest at one point and said "it's back" She denies chest pain but describes it  like a cold sensation that takes her breath away. Explained to her that this is likely from her heart failure. She shares that she has been feeling as she is ready to die. Her granddaughter, Melanee Left states they have been hearing her say this more recently. She denies being afraid. States she does not want to take any pills ( has been non-compliant with medical mgt of cCHF at home; would only take lasix prn), would never have surgery again. She reports no appetite. Per chart review in October 2016 she weighed 120 lbs, and this admission now 103 lbs.  Contacts/Participants in Discussion: Primary Decision Maker: Pt can make decisions for herself at this point Relationship to Patient Has 2 adopted daughters, Caren Griffins and Diane HCPOA: no  Pt has 2 daughters, Caren Griffins and Shauna Hugh; lives with granddaughter Melanee Left and her children.  Met with Melanee Left, and Caren Griffins via conference call  SUMMARY OF RECOMMENDATIONS DNR/DNI No surgery in the  future Wants to minimize PO meds going forward Desires to avoid  rehospitalization. Hospice in home care elected. Consult placed to care managment Agreeable to low dose opioids to manage dyspnea  Code Status/Advance Care Planning: DNR    Code Status Orders        Start     Ordered   04/01/15 2051  Do not attempt resuscitation (DNR)   Continuous    Question Answer Comment  In the event of cardiac or respiratory ARREST Do not call a "code blue"   In the event of cardiac or respiratory ARREST Do not perform Intubation, CPR, defibrillation or ACLS   In the event of cardiac or respiratory ARREST Use medication by any route, position, wound care, and other measures to relive pain and suffering. May use oxygen, suction and manual treatment of airway obstruction as needed for comfort.      04/01/15 2050      Other Directives:None  Symptom Management:   Dyspnea: Provided education on opioids to manage not only moderate to severe pain but dyspnea. Family and pt in agreement to try low dose as dyspnea has been quite severe. Orde morphine concentrate 2.5-'5mg'$  q4 prn. Pt is opioid naive  Palliative Prophylaxis:   Aspiration, Bowel Regimen, Delirium Protocol, Frequent Pain Assessment, Oral Care and Turn Reposition  Additional Recommendations (Limitations, Scope, Preferences):  Hospice as a means to avoid unwanted hospitalization and provide additional suppoprt    Psycho-social/Spiritual:  Support System: Adequate Desire for further Chaplaincy support:no Additional Recommendations: Education on Hospice  Prognosis: Less than 6 months barring an acute event. Feel she would qualify for in-home hospice at this  point with dx of combined heart failure, EF 35% in setting of hip fx in June with assoc MI and new a-fib with RVR  Discharge Planning: Home with Hospice   Chief Complaint/ Primary Diagnoses: Present on Admission:  . Dyspnea . Acute on chronic respiratory failure with hypoxia  ([place]) . Chronic combined systolic and diastolic heart failure, NYHA class 1 ([place]) . Elevated troponin . HTN (hypertension) . Hyperlipidemia . Protein-calorie malnutrition, severe ([place])  I have reviewed the medical record, interviewed the patient and family, and examined the patient. The following aspects are pertinent.  Past Medical History  Diagnosis Date  . Hypertension   . CHF (congestive heart failure) ([place])   . Hyperlipidemia   . Heart murmur   . Shortness of breath   . GERD (gastroesophageal reflux disease)   . NSTEMI (non-ST elevated myocardial infarction) ([place])   . Pneumonia   . Atrial fibrillation ([place])     not on a/c due to falls risk  . Severe protein-calorie malnutrition [employer]: less than 60% of standard weight) [facility])    Social History   Social History  . Marital Status: Widowed    Spouse Name: N/A  . Number of Children: N/A  . Years of Education: N/A   Social History Main Topics  . Smoking status: Former Research scientist (life sciences)  . Smokeless tobacco: Never Used     Comment: quit smoking in the 80"s   . Alcohol Use: No  . Drug Use: No  . Sexual Activity: Not Asked   Other Topics Concern  . None   Social History Narrative   Lives with 2 granddaughters who assist with care.  Gets around with a walker.   No further home health services, but did previously   Family History  Problem Relation Age of Onset  . Hypertension Father   . Breast cancer Daughter    Scheduled Meds: . docusate sodium  100 mg Oral BID  . feeding supplement (ENSURE ENLIVE)  237 mL Oral BID BM  . Folic Acid-Vit U2-VOZ D66  1 tablet Oral Daily  . pantoprazole  40 mg Oral BID AC  . senna  1 tablet Oral BID  . sodium chloride  3 mL Intravenous Q12H   Continuous Infusions:  PRN Meds:.acetaminophen **OR** acetaminophen, metoprolol tartrate, ondansetron **OR** ondansetron (ZOFRAN) IV, polyethylene glycol Medications Prior to Admission:  Prior to Admission medications   Medication Sig Start Date End  Date Taking? Authorizing Provider  furosemide (LASIX) 40 MG tablet Take 1 tablet (40 mg total) by mouth daily. 01/12/15  Yes Bonnielee Haff, MD  simvastatin (ZOCOR) 20 MG tablet Take 20 mg by mouth daily at 6 PM.    Yes Historical Provider, MD  aspirin EC 325 MG EC tablet Take 1 tablet (325 mg total) by mouth daily. Patient taking differently: Take 325 mg by mouth daily as needed for pain.  09/08/14   Mcarthur Rossetti, MD  carvedilol (COREG) 3.125 MG tablet Take 1 tablet (3.125 mg total) by mouth 2 (two) times daily with a meal. 01/12/15   Bonnielee Haff, MD  Cholecalciferol (VITAMIN D-3) 5000 UNITS TABS Take 1 tablet by mouth daily.    Historical Provider, MD  lisinopril (PRINIVIL,ZESTRIL) 2.5 MG tablet Take 1 tablet (2.5 mg total) by mouth daily. 09/09/14   Donne Hazel, MD  NIFEdipine (PROCARDIA-XL/ADALAT CC) 60 MG 24 hr tablet Take 1 tablet (60 mg total) by mouth daily. 09/09/14   Donne Hazel, MD  Omega 3-6-9 Fatty Acids (OMEGA-3 FUSION) LIQD Take  3.2 mLs by mouth daily. '1000mg'$  omega 3 liquid    Historical Provider, MD  Probiotic Product (PROBIOTIC DAILY PO) Take 1 capsule by mouth daily.    Historical Provider, MD   No Known Allergies  Review of Systems  Constitutional: Positive for activity change, appetite change and fatigue.  Eyes: Negative.   Respiratory: Positive for shortness of breath.   Cardiovascular: Positive for palpitations.  Endocrine: Negative.   Genitourinary: Negative.   Musculoskeletal: Negative.   Allergic/Immunologic: Negative.   Neurological: Positive for weakness.  Hematological: Negative.   Psychiatric/Behavioral: Negative.     Physical Exam  Constitutional: She is oriented to person, place, and time.  Cachetic frail elderly female  HENT:  Head: Normocephalic and atraumatic.  Cardiovascular:  irrg  Respiratory:  Increased work of breathing  Musculoskeletal: Normal range of motion.  Neurological: She is alert and oriented to person, place, and  time.  Skin: Skin is warm and dry.  Psychiatric: She has a normal mood and affect.    Vital Signs: BP 137/98 mmHg  Pulse 107  Temp(Src) 97.8 F (36.6 C) (Oral)  Resp 18  Wt 47 kg (103 lb 9.9 oz)  SpO2 99%  SpO2: SpO2: 99 % O2 Device:SpO2: 99 % O2 Flow Rate: .O2 Flow Rate (L/min): 2 L/min  IO: Intake/output summary:  Intake/Output Summary (Last 24 hours) at 04/02/15 1122 Last data filed at 04/02/15 0856  Gross per 24 hour  Intake    240 ml  Output      0 ml  Net    240 ml    LBM: Last BM Date: 04/02/15 Baseline Weight: Weight: 47.174 kg (104 lb) (scale a) Most recent weight: Weight: 47 kg (103 lb 9.9 oz) (scale a)      Palliative Assessment/Data:  Flowsheet Rows        Most Recent Value   Intake Tab    Referral Department  Hospitalist   Unit at Time of Referral  Med/Surg Unit   Palliative Care Primary Diagnosis  Cardiac   Date Notified  04/01/15   Palliative Care Type  New Palliative care   Reason for referral  Clarify Goals of Care   Date of Admission  04/01/15   Date first seen by Palliative Care  04/02/15   # of days Palliative referral response time  1 Day(s)   # of days IP prior to Palliative referral  0   Clinical Assessment    Palliative Performance Scale Score  40%   Pain Max last 24 hours  0   Pain Min Last 24 hours  0   Dyspnea Max Last 24 Hours  8   Dyspnea Min Last 24 hours  4   Nausea Max Last 24 Hours  0   Nausea Min Last 24 Hours  0   Anxiety Max Last 24 Hours  0   Anxiety Min Last 24 Hours  0   Psychosocial & Spiritual Assessment    Palliative Care Outcomes    Patient/Family meeting held?  Yes   Who was at the meeting?  daughter Shauna Hugh, granddaughter Melanee Left   Palliative Care follow-up planned  Yes, Facility      Additional Data Reviewed:  CBC:    Component Value Date/Time   WBC 4.7 04/02/2015 0920   HGB 10.3* 04/02/2015 0920   HCT 32.0* 04/02/2015 0920   PLT 297 04/02/2015 0920   MCV 98.8 04/02/2015 0920   NEUTROABS 2.7  01/11/2015 0327   LYMPHSABS 0.7 01/11/2015 0327  MONOABS 0.3 01/11/2015 0327   EOSABS 0.1 01/11/2015 0327   BASOSABS 0.0 01/11/2015 0327   Comprehensive Metabolic Panel:    Component Value Date/Time   NA 144 04/02/2015 0245   K 5.0 04/02/2015 0245   CL 106 04/02/2015 0245   CO2 23 04/02/2015 0245   BUN 28* 04/02/2015 0245   CREATININE 1.57* 04/02/2015 0245   GLUCOSE 150* 04/02/2015 0245   CALCIUM 9.5 04/02/2015 0245   AST 58* 01/08/2015 1817   ALT 47 01/08/2015 1817   ALKPHOS 76 01/08/2015 1817   BILITOT 0.7 01/08/2015 1817   PROT 7.6 01/08/2015 1817   ALBUMIN 3.5 04/02/2015 0245     Time In: 1630 Time Out: 1745 Time Total: 75 min Greater than 50%  of this time was spent counseling and coordinating care related to the above assessment and plan. Staffed with Dr. Cruzita Lederer  Signed by: Dory Horn, NP  Dory Horn, NP  04/02/2015, 11:22 AM  Please contact Palliative Medicine Team phone at [phone] for questions and concerns.

## 2015-04-02 NOTE — Progress Notes (Signed)
Pt complaining of shortness of breath. Checked O2 sat, 99% on 2L. After repositioning PT, she reports feeling better. Will continue to monitor.

## 2015-04-03 ENCOUNTER — Encounter (HOSPITAL_COMMUNITY): Payer: Self-pay | Admitting: *Deleted

## 2015-04-03 DIAGNOSIS — Z515 Encounter for palliative care: Secondary | ICD-10-CM | POA: Diagnosis not present

## 2015-04-03 DIAGNOSIS — I5042 Chronic combined systolic (congestive) and diastolic (congestive) heart failure: Secondary | ICD-10-CM | POA: Diagnosis not present

## 2015-04-03 DIAGNOSIS — J9621 Acute and chronic respiratory failure with hypoxia: Secondary | ICD-10-CM | POA: Diagnosis not present

## 2015-04-03 DIAGNOSIS — R0600 Dyspnea, unspecified: Secondary | ICD-10-CM | POA: Diagnosis not present

## 2015-04-03 DIAGNOSIS — R7989 Other specified abnormal findings of blood chemistry: Secondary | ICD-10-CM | POA: Diagnosis not present

## 2015-04-03 LAB — RENAL FUNCTION PANEL
ALBUMIN: 3.4 g/dL — AB (ref 3.5–5.0)
Anion gap: 14 (ref 5–15)
BUN: 46 mg/dL — ABNORMAL HIGH (ref 6–20)
CO2: 25 mmol/L (ref 22–32)
Calcium: 9.3 mg/dL (ref 8.9–10.3)
Chloride: 105 mmol/L (ref 101–111)
Creatinine, Ser: 1.64 mg/dL — ABNORMAL HIGH (ref 0.44–1.00)
GFR calc Af Amer: 31 mL/min — ABNORMAL LOW (ref >60)
GFR calc non Af Amer: 27 mL/min — ABNORMAL LOW (ref >60)
GLUCOSE: 152 mg/dL — AB (ref 65–99)
PHOSPHORUS: 5.2 mg/dL — AB (ref 2.5–4.6)
POTASSIUM: 4.4 mmol/L (ref 3.5–5.1)
SODIUM: 144 mmol/L (ref 135–145)

## 2015-04-03 LAB — CBC
HEMATOCRIT: 32.3 % — AB (ref 36.0–46.0)
HEMOGLOBIN: 10.5 g/dL — AB (ref 12.0–15.0)
MCH: 31.9 pg (ref 26.0–34.0)
MCHC: 32.5 g/dL (ref 30.0–36.0)
MCV: 98.2 fL (ref 78.0–100.0)
Platelets: 307 10*3/uL (ref 150–400)
RBC: 3.29 MIL/uL — AB (ref 3.87–5.11)
RDW: 17.2 % — ABNORMAL HIGH (ref 11.5–15.5)
WBC: 4.8 10*3/uL (ref 4.0–10.5)

## 2015-04-03 LAB — MAGNESIUM: Magnesium: 2.4 mg/dL (ref 1.7–2.4)

## 2015-04-03 MED ORDER — MORPHINE SULFATE (CONCENTRATE) 10 MG/0.5ML PO SOLN: 2.5000 mg | ORAL | Status: AC | PRN

## 2015-04-03 NOTE — Discharge Instructions (Signed)
Follow with FULP, CAMMIE, MD in 5-7 days and hospice services  Please get a complete blood count and chemistry panel checked by your Primary MD at your next visit, and again as instructed by your Primary MD. Please get your medications reviewed and adjusted by your Primary MD.  Please request your Primary MD to go over all Hospital Tests and Procedure/Radiological results at the follow up, please get all Hospital records sent to your Prim MD by signing hospital release before you go home.  If you had Pneumonia of Lung problems at the Hospital: Please get a 2 view Chest X ray done in 6-8 weeks after hospital discharge or sooner if instructed by your Primary MD.  If you have Congestive Heart Failure: Please call your Cardiologist or Primary MD anytime you have any of the following symptoms:  1) 3 pound weight gain in 24 hours or 5 pounds in 1 week  2) shortness of breath, with or without a dry hacking cough  3) swelling in the hands, feet or stomach  4) if you have to sleep on extra pillows at night in order to breathe  Follow cardiac low salt diet and 1.5 lit/day fluid restriction.  If you have diabetes Accuchecks 4 times/day, Once in AM empty stomach and then before each meal. Log in all results and show them to your primary doctor at your next visit. If any glucose reading is under 80 or above 300 call your primary MD immediately.  If you have Seizure/Convulsions/Epilepsy: Please do not drive, operate heavy machinery, participate in activities at heights or participate in high speed sports until you have seen by Primary MD or a Neurologist and advised to do so again.  If you had Gastrointestinal Bleeding: Please ask your Primary MD to check a complete blood count within one week of discharge or at your next visit. Your endoscopic/colonoscopic biopsies that are pending at the time of discharge, will also need to followed by your Primary MD.  Get Medicines reviewed and adjusted. Please  take all your medications with you for your next visit with your Primary MD  Please request your Primary MD to go over all hospital tests and procedure/radiological results at the follow up, please ask your Primary MD to get all Hospital records sent to his/her office.  If you experience worsening of your admission symptoms, develop shortness of breath, life threatening emergency, suicidal or homicidal thoughts you must seek medical attention immediately by calling 911 or calling your MD immediately  if symptoms less severe.  You must read complete instructions/literature along with all the possible adverse reactions/side effects for all the Medicines you take and that have been prescribed to you. Take any new Medicines after you have completely understood and accpet all the possible adverse reactions/side effects.   Do not drive or operate heavy machinery when taking Pain medications.   Do not take more than prescribed Pain, Sleep and Anxiety Medications  Special Instructions: If you have smoked or chewed Tobacco  in the last 2 yrs please stop smoking, stop any regular Alcohol  and or any Recreational drug use.  Wear Seat belts while driving.  Please note You were cared for by a hospitalist during your hospital stay. If you have any questions about your discharge medications or the care you received while you were in the hospital after you are discharged, you can call the unit and asked to speak with the hospitalist on call if the hospitalist that took care of you is  not available. Once you are discharged, your primary care physician will handle any further medical issues. Please note that NO REFILLS for any discharge medications will be authorized once you are discharged, as it is imperative that you return to your primary care physician (or establish a relationship with a primary care physician if you do not have one) for your aftercare needs so that they can reassess your need for medications  and monitor your lab values.  You can reach the hospitalist office at phone [phone] or fax [phone]   If you do not have a primary care physician, you can call [phone] for a physician referral.  Activity: As tolerated with Full fall precautions use walker/cane & assistance as needed  Diet: regular  Disposition Home with hospice

## 2015-04-03 NOTE — Progress Notes (Signed)
PROGRESS NOTE  Traci Hale AVW:[id number] DOB: 05/06/1924 DOA: 04/01/2015 PCP: Cain Saupe, MD  HPI: 81 y.o. year-old female with history of diastolic and systolic congestive heart failure, CAD being medically managed, hypertension, CKD stage III, GERD, recurrent pneumonia, and severe protein calorie malnutrition. She was hospitalized several times this year for hip fracture HCAP, A. fib with RVR, NSTEMI. She has chronic shortness of breath, which gradually got worse in the last few days and decided to come to the ED.  Subjective / 24 H Interval events - she is feeling better, wants to go home   Assessment/Plan: Active Problems:   HTN (hypertension)   Hyperlipidemia   Chronic combined systolic and diastolic heart failure, NYHA class 1 (HCC)   Acute on chronic respiratory failure with hypoxia (HCC)   Protein-calorie malnutrition, severe (HCC)   Elevated troponin   Dyspnea   Melena   Palliative care encounter  Goals of care - home with hospice pending hospice set up.  Acute on chronic respiratory failure with hypoxia - This is likely multifactorial in the setting of failure to thrive, malnutrition, deconditioning, as well as underlying chronic heart failure. She had a CT angiogram overnight due to an elevated d-dimer which was negative for a PE. - CT scan showed pulmonary hypertension which is likely to explain part of her respiratory failure - She is satting 100% on 2 L   Possible melena - Fecal occult was negative, hemoglobin is stable   Acute on Chronic kidney disease stage III - Baseline creatinine 1.0-1.2, creatine with slight worsening, possibly due to contrast - on hospice, focus on comfort, not HD candidate  Chronic combined systolic and diastolic heart failure - She has no lower extremity edema, no crackles, clinically does not appear fluid overloaded - She was evaluated by cardiology in the past, recommended conservative medical management, not being candidate  for invasive procedures - Patient is not taking her home medications instructed  Hyperlipidemia - She is noncompliant with her Zocor  Hypertension - Hold all medications for now  Severe protein calorie malnutrition - Regular diet   Diet: DIET DYS 3 Room service appropriate?: Yes; Fluid consistency:: Thin Fluids: none DVT Prophylaxis: SCD  Code Status: DNR Family Communication: d/w granddaughter  Disposition Plan: home as soon as hospice is set up Barriers to discharge: hospice set up  Consultants:  Palliative care   Procedures:  None   Antibiotics  Anti-infectives    None       Studies  Dg Chest 2 View  04/01/2015  CLINICAL DATA:  Initial encounter for two-day history of worsening shortness of breath. EXAM: CHEST  2 VIEW COMPARISON:  01/08/2015. FINDINGS: AP and lateral views of the chest show slight rightward patient rotation on the frontal projection. The cardio pericardial silhouette is enlarged. Wall calcification in the ascending aorta suggests coronal diameter approaching 4 cm. Lungs are hyperexpanded. Interstitial markings are diffusely coarsened with chronic features. No evidence for airspace pulmonary edema or pleural effusion. No focal airspace consolidation. Bones are diffusely demineralized. IMPRESSION: Hyperexpansion with cardiomegaly and underlying chronic interstitial changes. No acute cardiopulmonary process. Calcified ascending aorta which is borderline aneurysmal. Electronically Signed   By: Kennith Center M.D.   On: 04/01/2015 17:27   Ct Angio Chest Pe W/cm &/or Wo Cm  04/01/2015  CLINICAL DATA:  Subsequent encounter for 2 day history of shortness of breath with tachycardia and tachypnea EXAM: CT ANGIOGRAPHY CHEST WITH CONTRAST TECHNIQUE: Multidetector CT imaging of the chest was performed using the  standard protocol during bolus administration of intravenous contrast. Multiplanar CT image reconstructions and MIPs were obtained to evaluate the vascular  anatomy. CONTRAST:  60mL OMNIPAQUE IOHEXOL 350 MG/ML SOLN COMPARISON:  Chest x-ray from earlier the same day. FINDINGS: Mediastinum / Lymph Nodes: There is no axillary lymphadenopathy. No mediastinal lymphadenopathy. There is no hilar lymphadenopathy. Heart is markedly enlarged. No pericardial effusion. Coronary artery calcification is noted. Circumferential wall calcification is seen in the thoracic aorta with ascending aortic diameter maximally at 3.6 cm. Right main pulmonary artery measures 3.3 cm in diameter. Left main pulmonary artery measures 3.3 cm in diameter. There is no filling defect in the opacified pulmonary arteries to suggest the presence of an acute pulmonary embolus. Pulmonary arterial branches taper rapidly in the lungs. Lungs / Pleura: Bronchial wall thickening is evident. No focal airspace consolidation. No pulmonary edema or pleural effusion. 7 mm ground-glass nodule is seen in the left lower lobe on image 47 of series 507. There is some minimal atelectasis or scarring at the left base. Upper Abdomen:  Cysts are noted in the kidneys bilaterally. MSK / Soft Tissues:  Bones are diffusely demineralized. Review of the MIP images confirms the above findings. IMPRESSION: 1. No CT evidence of acute pulmonary embolus. 2. Enlargement of the main pulmonary arteries bilaterally with rapid tapering of pulmonary arterial vasculature. Imaging features raise the question of pulmonary arterial hypertension. 3. 7 mm ground-glass nodule in the left lower lobe. Initial follow-up by chest CT without contrast is recommended in 3 months to confirm persistence. This recommendation follows the consensus statement: Recommendations for the Management of Subsolid Pulmonary Nodules Detected at CT: A Statement from the Fleischner Society as published in Radiology 2013; 266:304-317. Electronically Signed   By: Kennith CenterEric  Mansell M.D.   On: 04/01/2015 22:14    Objective  Filed Vitals:   04/02/15 1302 04/02/15 1943 04/03/15  0451 04/03/15 1209  BP:  137/89 158/90 126/80  Pulse:  103 99 91  Temp:  97.3 F (36.3 C) 97.5 F (36.4 C) 97.4 F (36.3 C)  TempSrc:  Oral Oral Oral  Resp:  18 18 18   Height: 5\' 2"  (1.575 m)     Weight:   45.904 kg (101 lb 3.2 oz)   SpO2:  98% 98% 100%    Intake/Output Summary (Last 24 hours) at 04/03/15 1409 Last data filed at 04/03/15 1405  Gross per 24 hour  Intake    357 ml  Output    401 ml  Net    -44 ml   Filed Weights   04/01/15 2048 04/02/15 0423 04/03/15 0451  Weight: 47.174 kg (104 lb) 47 kg (103 lb 9.9 oz) 45.904 kg (101 lb 3.2 oz)    Exam:  GENERAL: NAD, frail appearing elderly female  HEENT: no scleral icterus, PERRL  NECK: supple, no LAD  LUNGS: CTA biL, no wheezing  HEART: RRR without MRG  ABDOMEN: soft, non tender  EXTREMITIES: no clubbing / cyanosis  NEUROLOGIC: non focal   Data Reviewed: Basic Metabolic Panel:  Recent Labs Lab 04/01/15 1615 04/02/15 0245 04/02/15 0920 04/03/15 0315  NA 144 144  --  144  K 4.1 5.0  --  4.4  CL 107 106  --  105  CO2 20* 23  --  25  GLUCOSE 169* 150*  --  152*  BUN 21* 28*  --  46*  CREATININE 1.40* 1.57*  --  1.64*  CALCIUM 9.3 9.5  --  9.3  MG  --   --  2.1 2.4  PHOS  --  5.9*  --  5.2*   Liver Function Tests:  Recent Labs Lab 04/02/15 0245 04/03/15 0315  ALBUMIN 3.5 3.4*   CBC:  Recent Labs Lab 04/01/15 1615 04/01/15 2034 04/02/15 0920 04/03/15 0315  WBC 3.8* 4.2 4.7 4.8  HGB 10.9* 10.3* 10.3* 10.5*  HCT 34.0* 31.5* 32.0* 32.3*  MCV 99.4 98.4 98.8 98.2  PLT 323 320 297 307   Cardiac Enzymes:  Recent Labs Lab 04/01/15 2033 04/02/15 0245 04/02/15 0920  TROPONINI 0.12* 0.15* 0.14*   BNP (last 3 results)  Recent Labs  09/02/14 1835 01/09/15 0326 04/01/15 2032  BNP 1502.4* >4500.0* >4500.0*    Scheduled Meds: . docusate sodium  100 mg Oral BID  . feeding supplement (ENSURE ENLIVE)  237 mL Oral TID BM  . Folic Acid-Vit B6-Vit B12  1 tablet Oral Daily  .  pantoprazole  40 mg Oral BID AC  . senna  1 tablet Oral BID  . sodium chloride  3 mL Intravenous Q12H   Continuous Infusions:     Pamella Pert, MD [facility] Pager [phone]. If 7 PM - 7 AM, please contact night-coverage at [email or web address], password [facility] 04/03/2015, 2:09 PM

## 2015-04-03 NOTE — Progress Notes (Signed)
Daily Progress Note   Patient Name: Traci Hale       Date: 04/03/2015 DOB: [birth year]  Age: 81 y.o. MRN#: [id number] Attending Physician: Caren Griffins, MD Primary Care Physician: Antony Blackbird, MD Admit Date: 04/01/2015  Reason for Consultation/Follow-up: Hospice Evaluation and Non pain symptom management  Subjective: Pt slept better last night. Resp not labored this am. She did receive her first dose of ms04 concentrate last night. She did report she was a little sleepy but breathing improved. Spoke to RN who had her last night and she confirmed. Her daughter Shauna Hugh is at the bedside this am. Reviewed plan to go home with hospice support for cCHF, symptom mgt meds in the home with overall goal to not return to the hospital if she can help it. Diane in agreement. Notice creatinine is worse this am at 1.64 Interval Events: Added low dose ms04 concentrate for dyspnea. Pt has had 1 dose with beneficial results Length of Stay: none  Current Medications: Scheduled Meds:  . docusate sodium  100 mg Oral BID  . feeding supplement (ENSURE ENLIVE)  237 mL Oral TID BM  . Folic Acid-Vit Y5-RKV T55  1 tablet Oral Daily  . pantoprazole  40 mg Oral BID AC  . senna  1 tablet Oral BID  . sodium chloride  3 mL Intravenous Q12H    Continuous Infusions:    PRN Meds: acetaminophen **OR** acetaminophen, metoprolol tartrate, morphine CONCENTRATE, ondansetron **OR** ondansetron (ZOFRAN) IV, polyethylene glycol  Physical Exam: Physical Exam  Constitutional:  Cachetic elderly frail female  Pulmonary/Chest: Effort normal.  Musculoskeletal: Normal range of motion.  Skin: Skin is warm and dry.                Vital Signs: BP 158/90 mmHg  Pulse 99  Temp(Src) 97.5 F (36.4 C) (Oral)  Resp 18   Ht _0  (1.575 m)  Wt 45.904 kg (101 lb 3.2 oz)  BMI 18.51 kg/m2  SpO2 98% SpO2: SpO2: 98 % O2 Device: O2 Device: Nasal Cannula O2 Flow Rate: O2 Flow Rate (L/min): 2 L/min  Intake/output summary:  Intake/Output Summary (Last 24 hours) at 04/03/15 1004 Last data filed at 04/03/15 1004  Gross per 24 hour  Intake    467 ml  Output    201 ml  Net  266 ml   LBM: Last BM Date: 04/02/15 Baseline Weight: Weight: 47.174 kg (104 lb) (scale a) Most recent weight: Weight: 45.904 kg (101 lb 3.2 oz) (scale a)       Palliative Assessment/Data: Flowsheet Rows        Most Recent Value   Intake Tab    Referral Department  Hospitalist   Unit at Time of Referral  Med/Surg Unit   Palliative Care Primary Diagnosis  Cardiac   Date Notified  04/01/15   Palliative Care Type  New Palliative care   Reason for referral  Clarify Goals of Care   Date of Admission  04/01/15   Date first seen by Palliative Care  04/02/15   # of days Palliative referral response time  1 Day(s)   # of days IP prior to Palliative referral  0   Clinical Assessment    Palliative Performance Scale Score  40%   Pain Max last 24 hours  0   Pain Min Last 24 hours  0   Dyspnea Max Last 24 Hours  Other (Comment)   Dyspnea Min Last 24 hours  Other (Comment)   Nausea Max Last 24 Hours  0   Nausea Min Last 24 Hours  0   Anxiety Max Last 24 Hours  0   Anxiety Min Last 24 Hours  0   Other Max Last 24 Hours  0   Psychosocial & Spiritual Assessment    Palliative Care Outcomes    Patient/Family meeting held?  Yes   Who was at the meeting?  met with daughter Diane this am,  daughte Cynthis and granddaughter last night   Palliative Care Outcomes  Improved non-pain symptom therapy, Clarified goals of care, Counseled regarding hospice, Completed durable DNR   Patient/Family wishes: Interventions discontinued/not started   Mechanical Ventilation, NIPPV, BiPAP, Trach, Hemodialysis, Vasopressors, Tube feedings/TPN   Palliative Care  follow-up planned  No      Additional Data Reviewed: CBC    Component Value Date/Time   WBC 4.8 04/03/2015 0315   RBC 3.29* 04/03/2015 0315   HGB 10.5* 04/03/2015 0315   HCT 32.3* 04/03/2015 0315   PLT 307 04/03/2015 0315   MCV 98.2 04/03/2015 0315   MCH 31.9 04/03/2015 0315   MCHC 32.5 04/03/2015 0315   RDW 17.2* 04/03/2015 0315   LYMPHSABS 0.7 01/11/2015 0327   MONOABS 0.3 01/11/2015 0327   EOSABS 0.1 01/11/2015 0327   BASOSABS 0.0 01/11/2015 0327    CMP     Component Value Date/Time   NA 144 04/03/2015 0315   K 4.4 04/03/2015 0315   CL 105 04/03/2015 0315   CO2 25 04/03/2015 0315   GLUCOSE 152* 04/03/2015 0315   BUN 46* 04/03/2015 0315   CREATININE 1.64* 04/03/2015 0315   CALCIUM 9.3 04/03/2015 0315   PROT 7.6 01/08/2015 1817   ALBUMIN 3.4* 04/03/2015 0315   AST 58* 01/08/2015 1817   ALT 47 01/08/2015 1817   ALKPHOS 76 01/08/2015 1817   BILITOT 0.7 01/08/2015 1817   GFRNONAA 27* 04/03/2015 0315   GFRAA 31* 04/03/2015 0315       Problem List:  Patient Active Problem List   Diagnosis Date Noted  . Palliative care encounter   . Dyspnea 04/01/2015  . Melena 04/01/2015  . HCAP (healthcare-associated pneumonia) 01/08/2015  . GERD (gastroesophageal reflux disease) 01/08/2015  . Acute encephalopathy 01/08/2015  . Hypernatremia 01/08/2015  . AKI (acute kidney injury) (HCC)   . Pressure ulcer 09/05/2014  . NSTEMI (non-ST  elevated myocardial infarction) ([place]) 09/04/2014  . Femoral neck fracture ([place]) 09/03/2014  . Hip fracture ([place]) 09/03/2014  . Elevated troponin 09/03/2014  . Fall   . CAP (community acquired pneumonia) 09/02/2014  . Influenza with pneumonia 06/14/2014  . Acute respiratory failure with hypoxia ([place])   . Protein-calorie malnutrition, severe ([place]) 06/12/2014  . Sepsis ([place]) 06/10/2014  . Community acquired pneumonia 06/10/2014  . Hypokalemia 06/10/2014  . Chronic combined systolic and diastolic heart failure, NYHA class 1 ([place]) 06/10/2014   . Leukopenia 06/10/2014  . Acute on chronic respiratory failure with hypoxia ([place]) 06/10/2014  . Streptococcus pneumoniae 06/10/2014  . Acute on chronic systolic and diastolic heart failure, NYHA class 3 ([place]) 01/08/2014  . HTN (hypertension) 01/08/2014  . Hyperlipidemia 01/08/2014     Palliative Care Assessment & Plan    1.Code Status:  DNR    Code Status Orders        Start     Ordered   04/01/15 2051  Do not attempt resuscitation (DNR)   Continuous    Question Answer Comment  In the event of cardiac or respiratory ARREST Do not call a "code blue"   In the event of cardiac or respiratory ARREST Do not perform Intubation, CPR, defibrillation or ACLS   In the event of cardiac or respiratory ARREST Use medication by any route, position, wound care, and other measures to relive pain and suffering. May use oxygen, suction and manual treatment of airway obstruction as needed for comfort.      04/01/15 2050       2. Goals of Care/Additional Recommendations:  Return home with hospice support. Care mgt consult placed 04/02/15  Wants to minimize PO meds but will take lasix, PO antibiotics in the home for URI if needed, Prn lab draws for electrolyte balance 2/2 lasix  Would not want HD if kidney function worsens  DNR on chart  Limitations on Scope of Treatment: Avoid Hospitalization, Minimize Medications, Initiate Comfort Feeding, No Hemodialysis, No Radiation, No Surgical Procedures and No Tracheostomy  Desire for further Chaplaincy support:no   3. Symptom Management:      1.Dyspnea: Tolerated first dose of ms04 concentrate well with mild sedation. Cont PRN  4. Palliative Prophylaxis:   Aspiration, Bowel Regimen and Turn Reposition  5. Prognosis: Less than 6 months 2/2 cCHF, CKD stage 3-4, debility with 17 lb weight loss since October  6. Discharge Planning:  Home with Hospice   Care plan was discussed with Dr. Cruzita Lederer  Thank you for allowing the Palliative  Medicine Team to assist in the care of this patient.   Time In: 0800 Time Out: 0830 Total Time 30 min Prolonged Time Billed  no         Dory Horn, NP  04/03/2015, 10:04 AM  Please contact Palliative Medicine Team phone at [phone] for questions and concerns.

## 2015-04-04 DIAGNOSIS — Z515 Encounter for palliative care: Secondary | ICD-10-CM | POA: Diagnosis not present

## 2015-04-04 DIAGNOSIS — I5042 Chronic combined systolic (congestive) and diastolic (congestive) heart failure: Secondary | ICD-10-CM | POA: Diagnosis not present

## 2015-04-04 DIAGNOSIS — R7989 Other specified abnormal findings of blood chemistry: Secondary | ICD-10-CM | POA: Diagnosis not present

## 2015-04-04 DIAGNOSIS — J9621 Acute and chronic respiratory failure with hypoxia: Secondary | ICD-10-CM | POA: Diagnosis not present

## 2015-04-04 DIAGNOSIS — R0600 Dyspnea, unspecified: Secondary | ICD-10-CM | POA: Diagnosis not present

## 2015-04-04 NOTE — Care Management Note (Signed)
Case Management Note  Patient Details  Name: Traci Hale MRN: [id number] Date of Birth: [birth year]  Subjective/Objective:                    Action/Plan: CM met with patient and  patient's daughter Britlee Skolnik [phone] regarding transitional plan for Home Hospice. Offered choice selected [facility]. Referral faxed in to [facility] [phone]. Palliative  met with patient and family to establish goals concerning end of life care. Discussed home hospice recommnedation, offered choice [facility] selected. Referral faxed to [facility] spoke with [facility] today,  Expected Discharge Date:     04/04/15           Expected Discharge Plan:  Home w Hospice Care  In-House Referral:     Discharge planning Services  CM Consult  Post Acute Care Choice:  Hospice Choice offered to:  Adult Children, Patient  DME Arranged:  Hospice Equipment Package A DME Agency:     HH Arranged:    [place] Agency:  Hospice and Palliative Care of   Status of Service:  In process, will continue to follow  Medicare Important Message Given:   yes  Date Medicare IM Given:   04/03/15 Medicare IM give by:   Wendi Maya RN CM Date Additional Medicare IM Given:    Additional Medicare Important Message give by:     If discussed at [place] of Stay Meetings, dates discussed:    Additional CommentsLaurena Slimmer, RN 04/04/2015, 1:40 PM

## 2015-04-04 NOTE — Progress Notes (Signed)
PROGRESS NOTE  Traci Hale BJY:[id number] DOB: [birth year] DOA: 04/01/2015 PCP: Cain Saupe, MD  HPI: 82 y.o. year-old female with history of diastolic and systolic congestive heart failure, CAD being medically managed, hypertension, CKD stage III, GERD, recurrent pneumonia, and severe protein calorie malnutrition. She was hospitalized several times this year for hip fracture HCAP, A. fib with RVR, NSTEMI. She has chronic shortness of breath, which gradually got worse in the last few days and decided to come to the ED.  Subjective / 24 H Interval events - she is feeling better  Assessment/Plan: Active Problems:   HTN (hypertension)   Hyperlipidemia   Chronic combined systolic and diastolic heart failure, NYHA class 1 (HCC)   Acute on chronic respiratory failure with hypoxia (HCC)   Protein-calorie malnutrition, severe (HCC)   Elevated troponin   Dyspnea   Melena   Palliative care encounter  Goals of care - home with hospice pending hospice set up. Unable to do over the weekend due to holiday - home tomorrow   Acute on chronic respiratory failure with hypoxia - This is likely multifactorial in the setting of failure to thrive, malnutrition, deconditioning, as well as underlying chronic heart failure. She had a CT angiogram overnight due to an elevated d-dimer which was negative for a PE. - CT scan showed pulmonary hypertension which is likely to explain part of her respiratory failure - She is satting 100% on 2 L   Possible melena - Fecal occult was negative, hemoglobin is stable   Acute on Chronic kidney disease stage III - Baseline creatinine 1.0-1.2, creatine with slight worsening, possibly due to contrast - on hospice, focus on comfort, not HD candidate  Chronic combined systolic and diastolic heart failure - She has no lower extremity edema, no crackles, clinically does not appear fluid overloaded - She was evaluated by cardiology in the past, recommended  conservative medical management, not being candidate for invasive procedures - Patient is not taking her home medications instructed  Hyperlipidemia - She is noncompliant with her Zocor  Hypertension - Hold all medications for now  Severe protein calorie malnutrition - Regular diet   Diet: DIET DYS 3 Room service appropriate?: Yes; Fluid consistency:: Thin Fluids: none DVT Prophylaxis: SCD  Code Status: DNR Family Communication: d/w granddaughter  Disposition Plan: home as soon as hospice is set up Barriers to discharge: hospice set up  Consultants:  Palliative care   Procedures:  None   Antibiotics  Anti-infectives    None       Studies  No results found.  Objective  Filed Vitals:   04/03/15 0451 04/03/15 1209 04/03/15 2033 04/04/15 0426  BP: 158/90 126/80 143/66 135/75  Pulse: 99 91 78 95  Temp: 97.5 F (36.4 C) 97.4 F (36.3 C) 97.7 F (36.5 C) 97.7 F (36.5 C)  TempSrc: Oral Oral Oral Oral  Resp: 18 18 18 18   Height:      Weight: 45.904 kg (101 lb 3.2 oz)   47.083 kg (103 lb 12.8 oz)  SpO2: 98% 100% 100% 100%    Intake/Output Summary (Last 24 hours) at 04/04/15 1249 Last data filed at 04/04/15 1213  Gross per 24 hour  Intake    417 ml  Output    350 ml  Net     67 ml   Filed Weights   04/02/15 0423 04/03/15 0451 04/04/15 0426  Weight: 47 kg (103 lb 9.9 oz) 45.904 kg (101 lb 3.2 oz) 47.083 kg (103 lb 12.8 oz)  Exam:  GENERAL: NAD, frail appearing elderly female  HEENT: no scleral icterus, PERRL  NECK: supple, no LAD  LUNGS: CTA biL, no wheezing  HEART: RRR without MRG  ABDOMEN: soft, non tender  EXTREMITIES: no clubbing / cyanosis  NEUROLOGIC: non focal  Data Reviewed: Basic Metabolic Panel:  Recent Labs Lab 04/01/15 1615 04/02/15 0245 04/02/15 0920 04/03/15 0315  NA 144 144  --  144  K 4.1 5.0  --  4.4  CL 107 106  --  105  CO2 20* 23  --  25  GLUCOSE 169* 150*  --  152*  BUN 21* 28*  --  46*  CREATININE  1.40* 1.57*  --  1.64*  CALCIUM 9.3 9.5  --  9.3  MG  --   --  2.1 2.4  PHOS  --  5.9*  --  5.2*   Liver Function Tests:  Recent Labs Lab 04/02/15 0245 04/03/15 0315  ALBUMIN 3.5 3.4*   CBC:  Recent Labs Lab 04/01/15 1615 04/01/15 2034 04/02/15 0920 04/03/15 0315  WBC 3.8* 4.2 4.7 4.8  HGB 10.9* 10.3* 10.3* 10.5*  HCT 34.0* 31.5* 32.0* 32.3*  MCV 99.4 98.4 98.8 98.2  PLT 323 320 297 307   Cardiac Enzymes:  Recent Labs Lab 04/01/15 2033 04/02/15 0245 04/02/15 0920  TROPONINI 0.12* 0.15* 0.14*   BNP (last 3 results)  Recent Labs  09/02/14 1835 01/09/15 0326 04/01/15 2032  BNP 1502.4* >4500.0* >4500.0*    Scheduled Meds: . docusate sodium  100 mg Oral BID  . feeding supplement (ENSURE ENLIVE)  237 mL Oral TID BM  . Folic Acid-Vit B6-Vit B12  1 tablet Oral Daily  . pantoprazole  40 mg Oral BID AC  . senna  1 tablet Oral BID  . sodium chloride  3 mL Intravenous Q12H   Continuous Infusions:     Pamella Pertostin Caira Poche, MD [facility] Pager [phone]. If 7 PM - 7 AM, please contact night-coverage at [email or web address], password [email or web address] 04/04/2015, 12:49 PM

## 2015-04-04 NOTE — Progress Notes (Signed)
Utilization Review Completed.Belton Peplinski T1/04/2015  

## 2015-04-05 DIAGNOSIS — I5042 Chronic combined systolic (congestive) and diastolic (congestive) heart failure: Secondary | ICD-10-CM | POA: Diagnosis not present

## 2015-04-05 DIAGNOSIS — J9621 Acute and chronic respiratory failure with hypoxia: Secondary | ICD-10-CM | POA: Diagnosis not present

## 2015-04-05 DIAGNOSIS — Z515 Encounter for palliative care: Secondary | ICD-10-CM | POA: Diagnosis not present

## 2015-04-05 DIAGNOSIS — R0600 Dyspnea, unspecified: Secondary | ICD-10-CM | POA: Diagnosis not present

## 2015-04-05 NOTE — Discharge Summary (Signed)
Physician Discharge Summary  Traci Hale ZOX:[id number] DOB: [birth year] DOA: 04/01/2015  PCP: Cain Saupe, MD  Admit date: 04/01/2015 Discharge date: 04/05/2015  Time spent: < 30 minutes  Recommendations for Outpatient Follow-up:  1. Home with hospice   Discharge Diagnoses:  Active Problems:   HTN (hypertension)   Hyperlipidemia   Chronic combined systolic and diastolic heart failure, NYHA class 1 (HCC)   Acute on chronic respiratory failure with hypoxia (HCC)   Protein-calorie malnutrition, severe (HCC)   Elevated troponin   Dyspnea   Melena   Palliative care encounter   Discharge Condition: guarded, with hospice  Diet recommendation: as tolerated  Filed Weights   04/03/15 0451 04/04/15 0426 04/05/15 0459  Weight: 45.904 kg (101 lb 3.2 oz) 47.083 kg (103 lb 12.8 oz) 48.988 kg (108 lb)    History of present illness:  See H&P, Labs, Consult and Test reports for all details in brief, patient is a 82 y.o. year-old female with history of diastolic and systolic congestive heart failure, CAD being medically managed, hypertension, CKD stage III, GERD, recurrent pneumonia, and severe protein calorie malnutrition. She was hospitalized several times this year for hip fracture HCAP, A. fib with RVR, NSTEMI. She has chronic shortness of breath, which gradually got worse in the last few days and decided to come to the ED.  Hospital Course:  Goals of care - palliative consulted and have followed patient. After family discussions and discussing with the patient, will set up home hospice, focus on comfort and avoid further hospitalizations.  Acute on chronic respiratory failure with hypoxia - This is likely multifactorial in the setting of failure to thrive, malnutrition, deconditioning, as well as underlying chronic heart failure. She had a CT angiogram due to an elevated d-dimer which was negative for a PE. CT scan showed pulmonary hypertension which is likely to explain part of her  respiratory failure. She is satting 100% on 2 L  Possible melena - Fecal occult was negative, hemoglobin is stable  Acute on Chronic kidney disease stage III - Baseline creatinine 1.0-1.2, creatine with slight worsening, possibly due to contrast - on hospice, focus on comfort, not HD candidate Chronic combined systolic and diastolic heart failure - She has no lower extremity edema, no crackles, clinically does not appear fluid overloaded. She was evaluated by cardiology in the past, recommended conservative medical management, not being candidate for invasive procedures. Patient is not taking her home medications instructed Hyperlipidemia - She is noncompliant with her Zocor Hypertension  Severe protein calorie malnutrition - Regular diet  Procedures:  None    Consultations:  Palliative  Discharge Exam: Filed Vitals:   04/04/15 0426 04/04/15 1253 04/04/15 2021 04/05/15 0459  BP: 135/75 103/53 120/77 128/70  Pulse: 95 89 81 94  Temp: 97.7 F (36.5 C) 98 F (36.7 C) 98.2 F (36.8 C) 98.6 F (37 C)  TempSrc: Oral Oral Oral Oral  Resp: 18 20 18 20   Height:      Weight: 47.083 kg (103 lb 12.8 oz)   48.988 kg (108 lb)  SpO2: 100% 100% 100% 100%    General: NAD Cardiovascular: RRR Respiratory: CTA biL  Discharge Instructions Activity:  As tolerated   Get Medicines reviewed and adjusted: Please take all your medications with you for your next visit with your Primary MD  Please request your Primary MD to go over all hospital tests and procedure/radiological results at the follow up, please ask your Primary MD to get all Hospital records sent to  his/her office.  If you experience worsening of your admission symptoms, develop shortness of breath, life threatening emergency, suicidal or homicidal thoughts you must seek medical attention immediately by calling 911 or calling your MD immediately if symptoms less severe.  You must read complete instructions/literature along with  all the possible adverse reactions/side effects for all the Medicines you take and that have been prescribed to you. Take any new Medicines after you have completely understood and accpet all the possible adverse reactions/side effects.   Do not drive when taking Pain medications.   Do not take more than prescribed Pain, Sleep and Anxiety Medications  Special Instructions: If you have smoked or chewed Tobacco in the last 2 yrs please stop smoking, stop any regular Alcohol and or any Recreational drug use.  Wear Seat belts while driving.  Please note  You were cared for by a hospitalist during your hospital stay. Once you are discharged, your primary care physician will handle any further medical issues. Please note that NO REFILLS for any discharge medications will be authorized once you are discharged, as it is imperative that you return to your primary care physician (or establish a relationship with a primary care physician if you do not have one) for your aftercare needs so that they can reassess your need for medications and monitor your lab values.    Medication List    STOP taking these medications        lisinopril 2.5 MG tablet  Commonly known as:  PRINIVIL,ZESTRIL      TAKE these medications        aspirin 325 MG EC tablet  Take 1 tablet (325 mg total) by mouth daily.     carvedilol 3.125 MG tablet  Commonly known as:  COREG  Take 1 tablet (3.125 mg total) by mouth 2 (two) times daily with a meal.     furosemide 40 MG tablet  Commonly known as:  LASIX  Take 1 tablet (40 mg total) by mouth daily.     morphine CONCENTRATE 10 MG/0.5ML Soln concentrated solution  Take 0.13-0.25 mLs (2.6-5 mg total) by mouth every 4 (four) hours as needed for moderate pain, severe pain or shortness of breath.     NIFEdipine 60 MG 24 hr tablet  Commonly known as:  PROCARDIA-XL/ADALAT CC  Take 1 tablet (60 mg total) by mouth daily.     OMEGA-3 FUSION Liqd  Take 3.2 mLs by mouth daily.  1000mg  omega 3 liquid     PROBIOTIC DAILY PO  Take 1 capsule by mouth daily.     simvastatin 20 MG tablet  Commonly known as:  ZOCOR  Take 20 mg by mouth daily at 6 PM.     Vitamin D-3 5000 UNITS Tabs  Take 1 tablet by mouth daily.        The results of significant diagnostics from this hospitalization (including imaging, microbiology, ancillary and laboratory) are listed below for reference.    Significant Diagnostic Studies: Dg Chest 2 View  04/01/2015  CLINICAL DATA:  Initial encounter for two-day history of worsening shortness of breath. EXAM: CHEST  2 VIEW COMPARISON:  01/08/2015. FINDINGS: AP and lateral views of the chest show slight rightward patient rotation on the frontal projection. The cardio pericardial silhouette is enlarged. Wall calcification in the ascending aorta suggests coronal diameter approaching 4 cm. Lungs are hyperexpanded. Interstitial markings are diffusely coarsened with chronic features. No evidence for airspace pulmonary edema or pleural effusion. No focal airspace consolidation. Bones are  diffusely demineralized. IMPRESSION: Hyperexpansion with cardiomegaly and underlying chronic interstitial changes. No acute cardiopulmonary process. Calcified ascending aorta which is borderline aneurysmal. Electronically Signed   By: Kennith Center M.D.   On: 04/01/2015 17:27   Ct Angio Chest Pe W/cm &/or Wo Cm  04/01/2015  CLINICAL DATA:  Subsequent encounter for 2 day history of shortness of breath with tachycardia and tachypnea EXAM: CT ANGIOGRAPHY CHEST WITH CONTRAST TECHNIQUE: Multidetector CT imaging of the chest was performed using the standard protocol during bolus administration of intravenous contrast. Multiplanar CT image reconstructions and MIPs were obtained to evaluate the vascular anatomy. CONTRAST:  60mL OMNIPAQUE IOHEXOL 350 MG/ML SOLN COMPARISON:  Chest x-ray from earlier the same day. FINDINGS: Mediastinum / Lymph Nodes: There is no axillary lymphadenopathy.  No mediastinal lymphadenopathy. There is no hilar lymphadenopathy. Heart is markedly enlarged. No pericardial effusion. Coronary artery calcification is noted. Circumferential wall calcification is seen in the thoracic aorta with ascending aortic diameter maximally at 3.6 cm. Right main pulmonary artery measures 3.3 cm in diameter. Left main pulmonary artery measures 3.3 cm in diameter. There is no filling defect in the opacified pulmonary arteries to suggest the presence of an acute pulmonary embolus. Pulmonary arterial branches taper rapidly in the lungs. Lungs / Pleura: Bronchial wall thickening is evident. No focal airspace consolidation. No pulmonary edema or pleural effusion. 7 mm ground-glass nodule is seen in the left lower lobe on image 47 of series 507. There is some minimal atelectasis or scarring at the left base. Upper Abdomen:  Cysts are noted in the kidneys bilaterally. MSK / Soft Tissues:  Bones are diffusely demineralized. Review of the MIP images confirms the above findings. IMPRESSION: 1. No CT evidence of acute pulmonary embolus. 2. Enlargement of the main pulmonary arteries bilaterally with rapid tapering of pulmonary arterial vasculature. Imaging features raise the question of pulmonary arterial hypertension. 3. 7 mm ground-glass nodule in the left lower lobe. Initial follow-up by chest CT without contrast is recommended in 3 months to confirm persistence. This recommendation follows the consensus statement: Recommendations for the Management of Subsolid Pulmonary Nodules Detected at CT: A Statement from the Fleischner Society as published in Radiology 2013; 266:304-317. Electronically Signed   By: Kennith Center M.D.   On: 04/01/2015 22:14   Labs: Basic Metabolic Panel:  Recent Labs Lab 04/01/15 1615 04/02/15 0245 04/02/15 0920 04/03/15 0315  NA 144 144  --  144  K 4.1 5.0  --  4.4  CL 107 106  --  105  CO2 20* 23  --  25  GLUCOSE 169* 150*  --  152*  BUN 21* 28*  --  46*    CREATININE 1.40* 1.57*  --  1.64*  CALCIUM 9.3 9.5  --  9.3  MG  --   --  2.1 2.4  PHOS  --  5.9*  --  5.2*   Liver Function Tests:  Recent Labs Lab 04/02/15 0245 04/03/15 0315  ALBUMIN 3.5 3.4*   CBC:  Recent Labs Lab 04/01/15 1615 04/01/15 2034 04/02/15 0920 04/03/15 0315  WBC 3.8* 4.2 4.7 4.8  HGB 10.9* 10.3* 10.3* 10.5*  HCT 34.0* 31.5* 32.0* 32.3*  MCV 99.4 98.4 98.8 98.2  PLT 323 320 297 307   Cardiac Enzymes:  Recent Labs Lab 04/01/15 2033 04/02/15 0245 04/02/15 0920  TROPONINI 0.12* 0.15* 0.14*   BNP: BNP (last 3 results)  Recent Labs  09/02/14 1835 01/09/15 0326 04/01/15 2032  BNP 1502.4* >4500.0* >4500.0*    Signed:  GHERGHE, COSTIN  [facility] 04/05/2015, 9:49 AM  2

## 2015-04-05 NOTE — Progress Notes (Signed)
Patient was seen by previous Case Manager for home hospice and [facility]. Lisa with [facility] in to see patient and is arranging DME to be ordered today. Abelino DerrickB Laurenashley Viar [facility] FontanaRN,MHA,BSN [phone]

## 2015-04-05 NOTE — Progress Notes (Signed)
[facility], all arrangements have been made for discharge home today. Daughter is to take patient home in private vehicle with oxygen; oxygen has been delivered to the room for transport. Abelino DerrickB Lillah Standre [facility] [address] Gastroenterology IncRN,MHA,BSN [phone]

## 2015-04-05 NOTE — Progress Notes (Signed)
Notified by Jiles CrockerBrenda Chandler, CMRN of pt./family request for Hospice and Palliative Care Services at home after discharge. Chart and patient information are under review with Dr. Barbee ShropshireHertweck,  [facility] MD.  Writer spoke with patient in the room and then grandaughter Wayland Denisetra Russell by phone. Education was initiated related to hospice philosophy, services and team approach to care. Family voiced understanding of information provided. Per discussion plan is for discharge to home later today by personal vehicle.  Please send signed and completed DNR form home with pt./family. Pt. will need prescriptions for discharge comfort medications.  DME needs discussed and pt. has a walker in the home. Family requests a bed, table and  O2 at 2L [place] to be delivered to the home today. [facility] equipment manager Jewel Kizzie BaneHughes notified and will contact [facility] to arrange delivery to the home. The home address has been verified and is correct in the chart; Nelma RothmanDetra is the family contact to be called to arrange time of delivery.  [facility] aware of above. Please notify [facility] when pt. Is ready to leave the unit at discharge- call [phone] ( or [phone] if after 5 pm). [facility] information and numbers given to pt.during visit and tel. number given to Detra over the phone. Please call with any questions.  Sharen HeckLisa Strandberg RN [facility] Hospital Liaison [phone]

## 2015-05-20 ENCOUNTER — Other Ambulatory Visit: Payer: Self-pay

## 2015-10-02 DEATH — deceased

## 2017-03-06 IMAGING — CR DG HIP (WITH OR WITHOUT PELVIS) 2-3V*R*
3 series · 3 of 3 positions shown · non-contrast
Comparison: None.

CLINICAL DATA: Disoriented.  Fall.  Right hip pain.

EXAM:
RIGHT HIP (WITH PELVIS) 2-3 VIEWS

[t hip frog leg right]
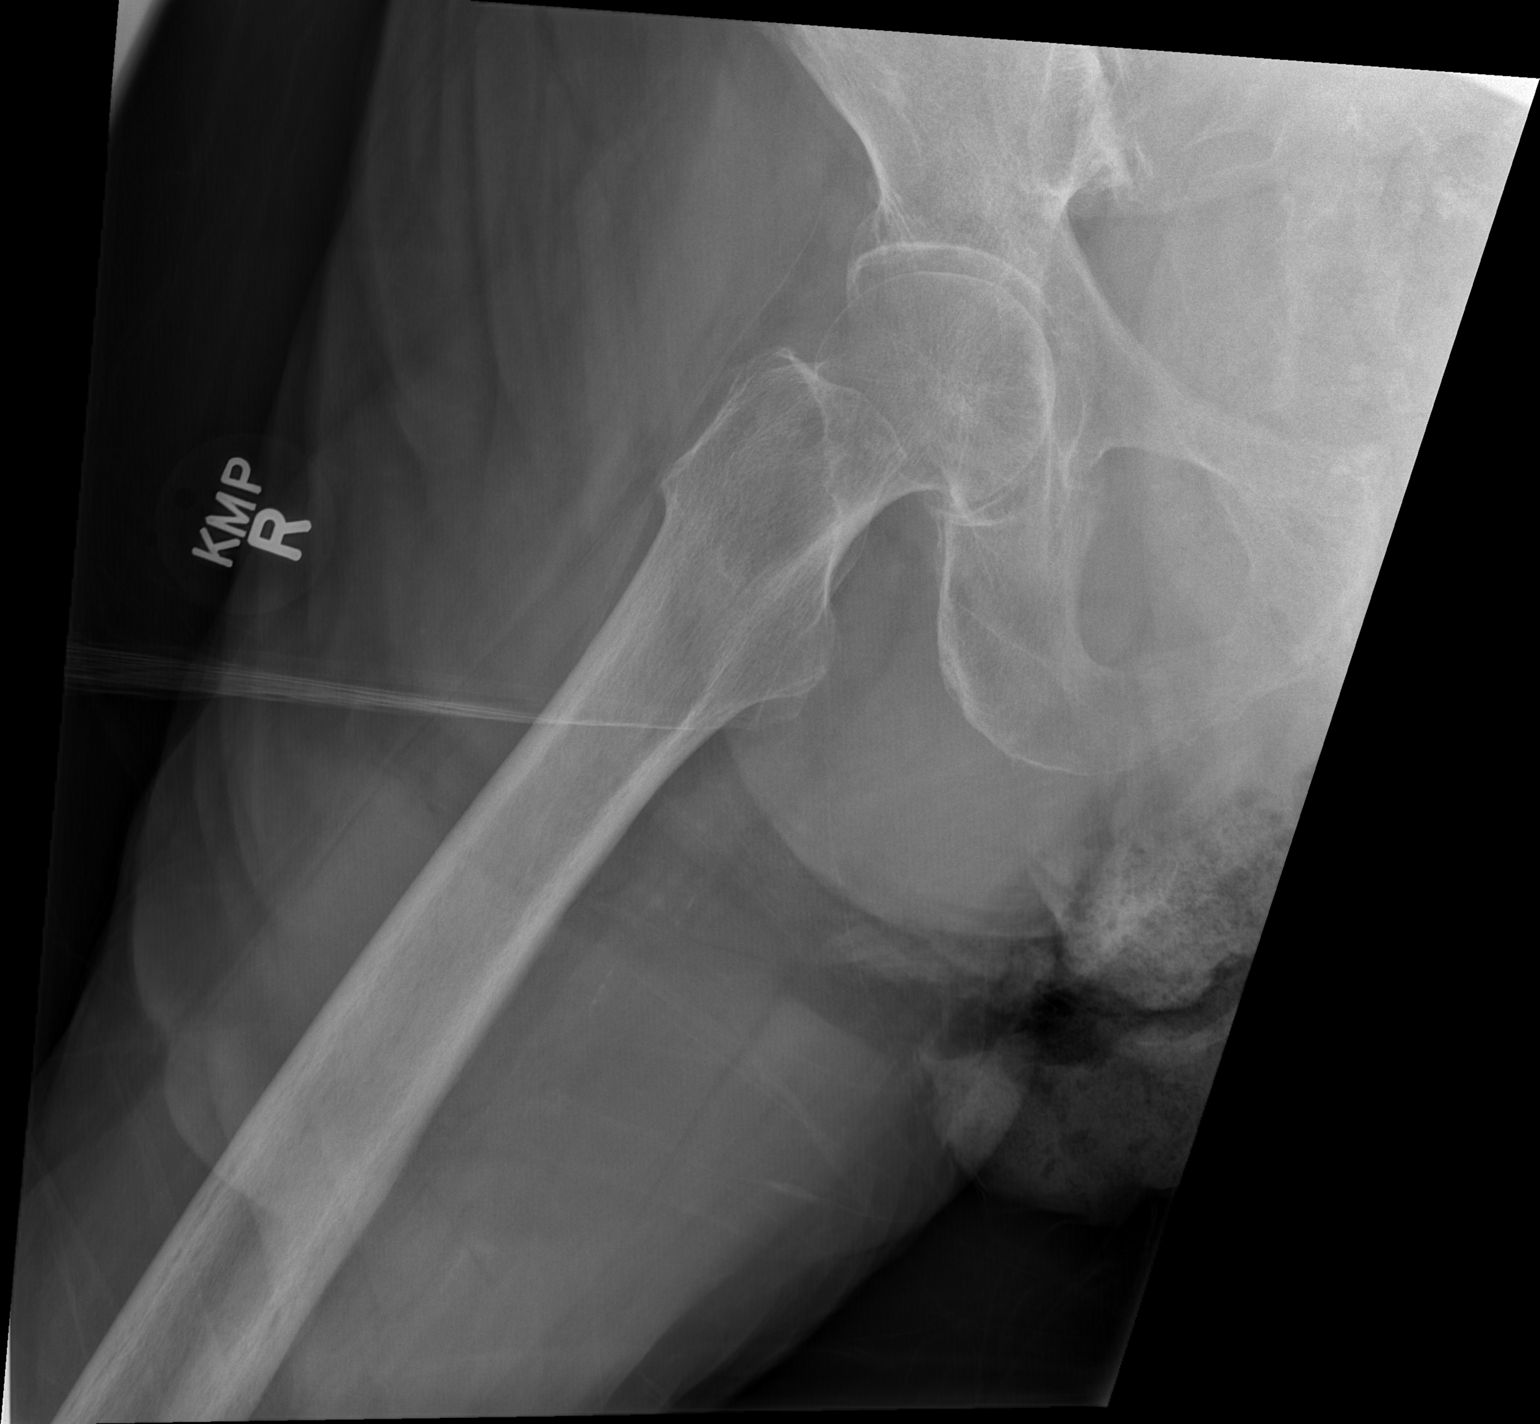

[t pelvis ap (1 of 2)]
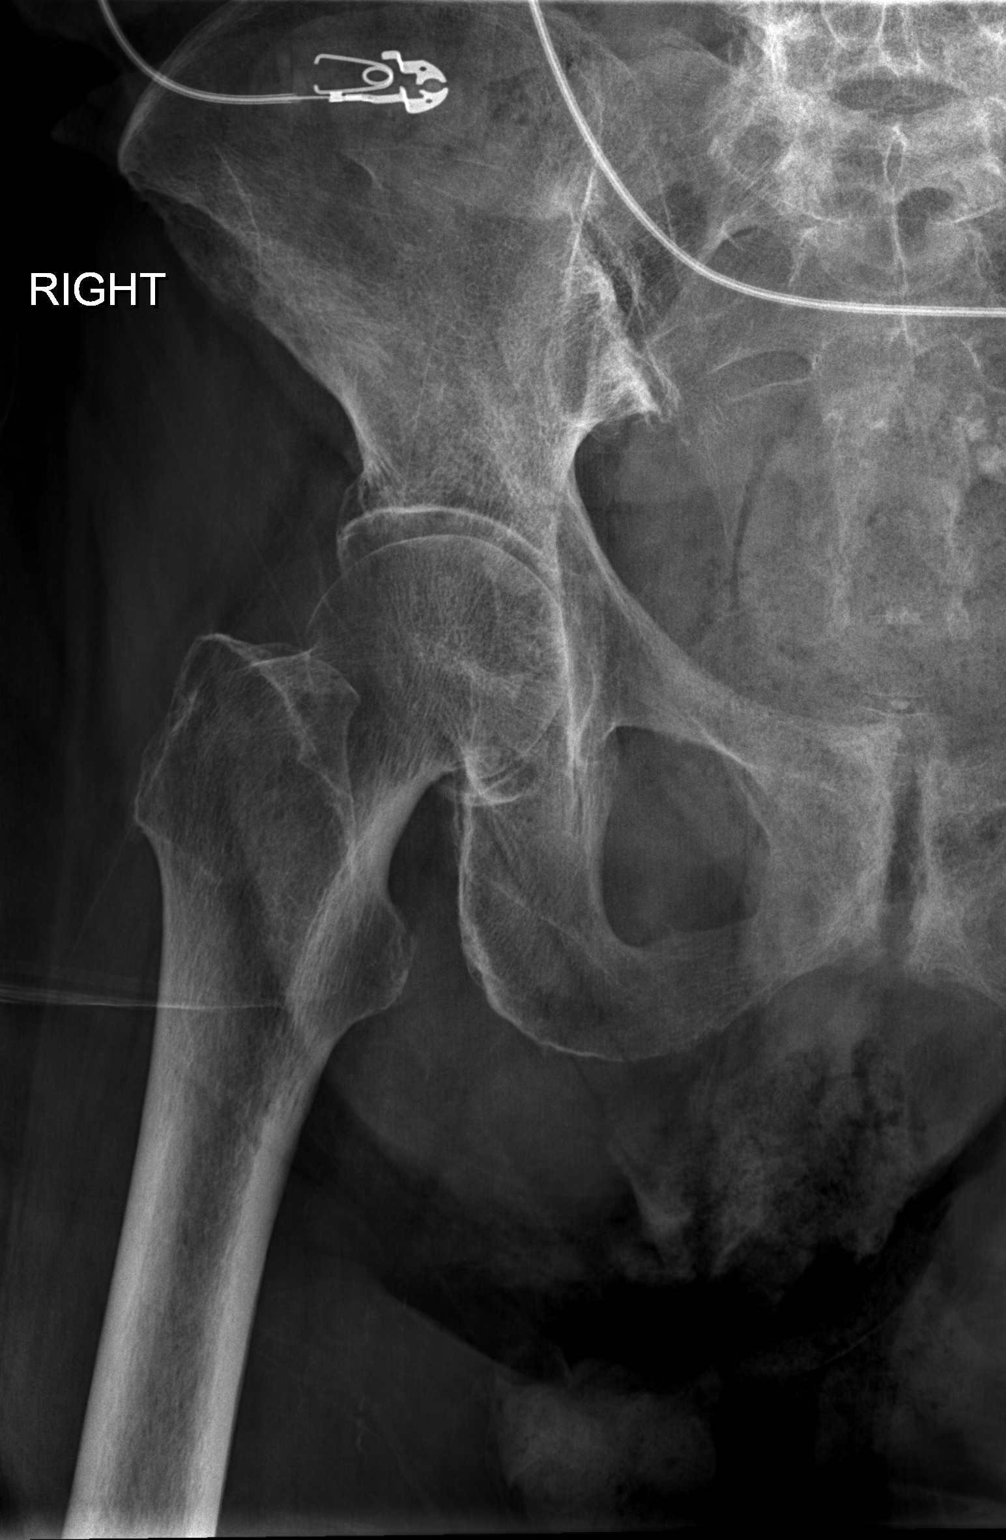

[t pelvis ap (2 of 2)]
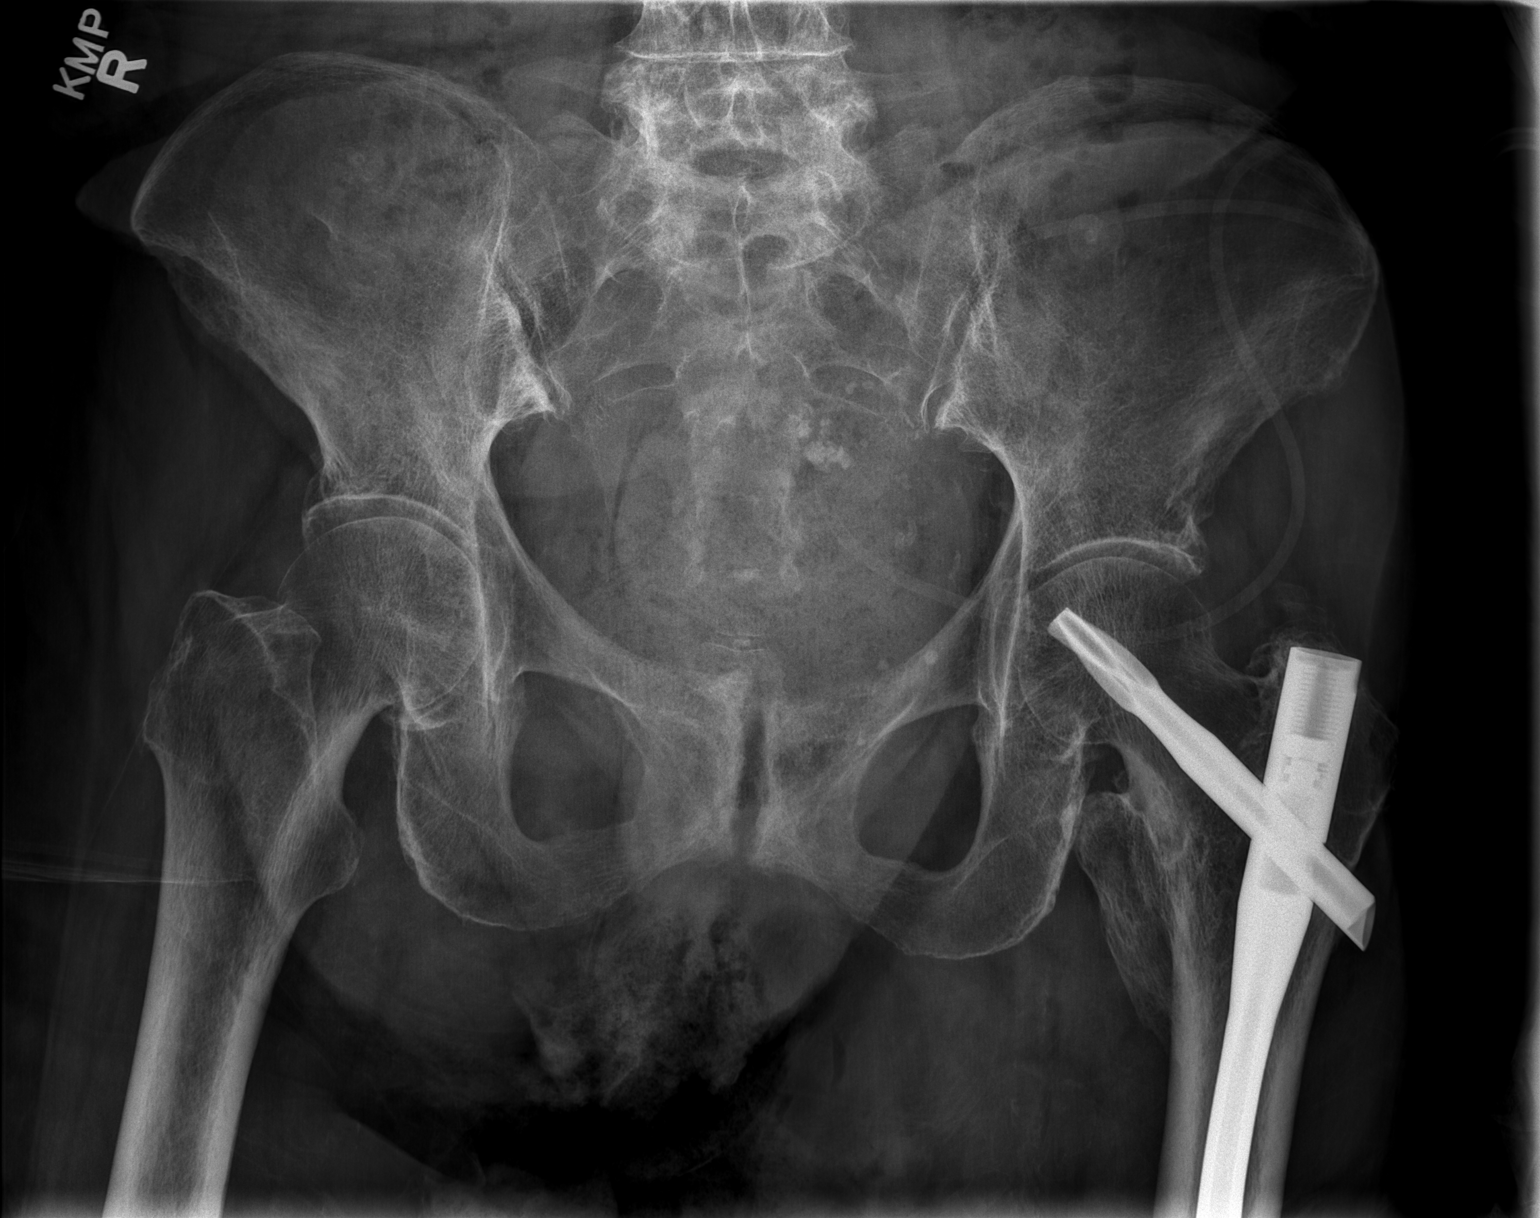

[3 of 3 positions shown; findings below may reference images not displayed]

FINDINGS: Hardware noted within the left femur from prior injury and internal
fixation. No acute fracture, subluxation or dislocation. Mild
osteopenia. Hip joints and SI joints are symmetric.
IMPRESSION: No acute bony abnormality.

## 2017-03-06 IMAGING — DX DG CHEST 1V PORT
1 series · 2 of 2 positions shown · non-contrast
Comparison: 06/10/2014

CLINICAL DATA: Line placement and endotracheal tube position.

EXAM:
PORTABLE CHEST - 1 VIEW

[Series 1: chest ap · 0.14mm/px · 2 of 2 slices shown]
[im 1/2]
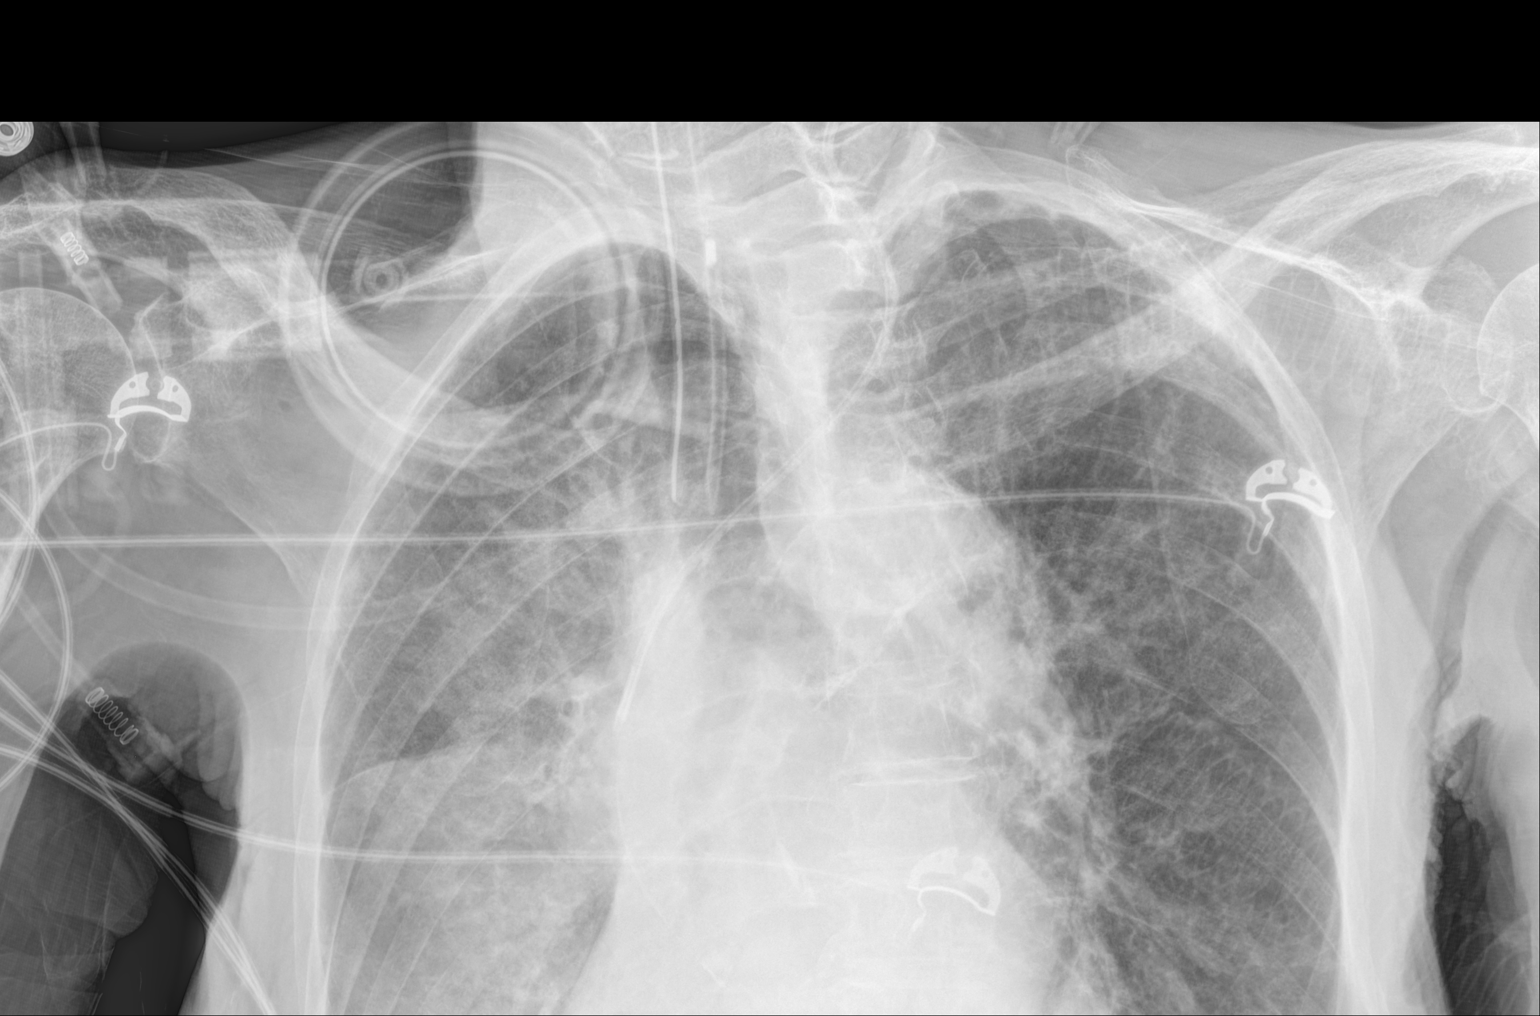
[im 2/2]
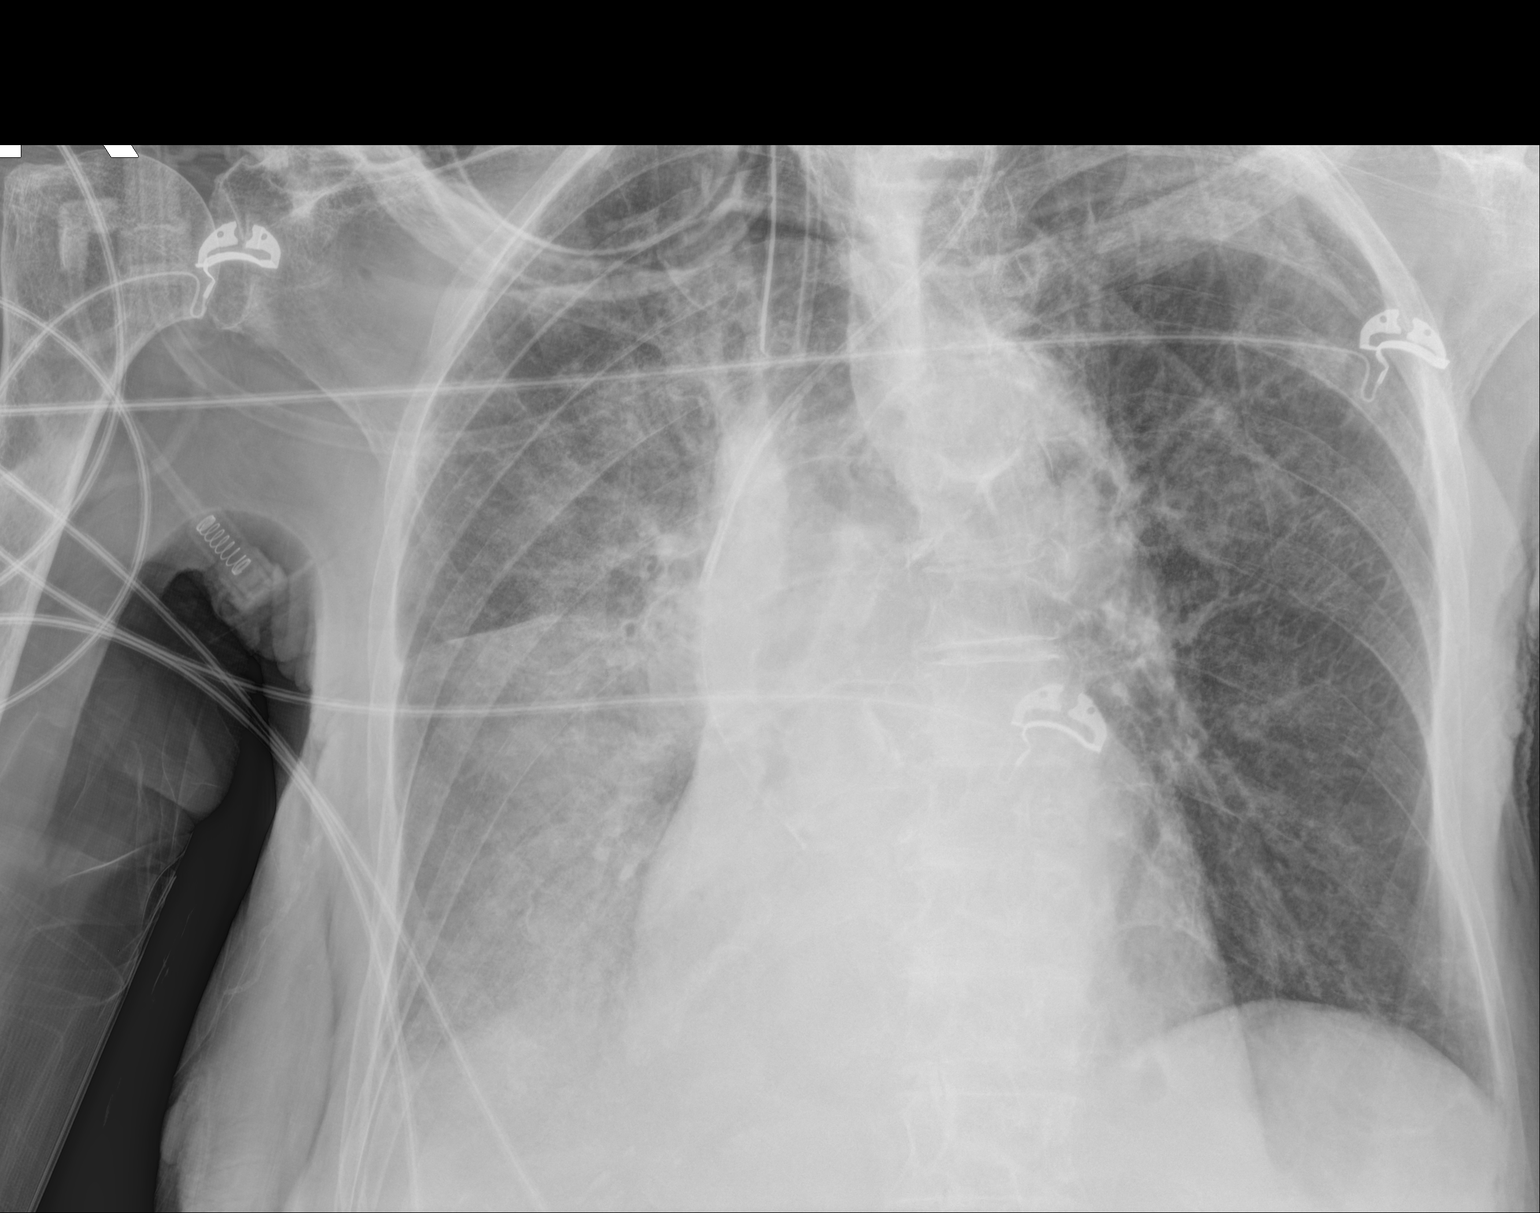

[2 of 2 positions shown; findings below may reference images not displayed]

FINDINGS: Interval placement of an endotracheal tube with tip measuring 3.7 cm
above the carina. A left central venous catheter was placed with tip
over the mid SVC region. No pneumothorax. Airspace disease in the
right lung suggesting asymmetric edema versus pneumonia. No blunting
of costophrenic angles. Normal heart size and pulmonary vascularity.
Calcified and tortuous aorta.
IMPRESSION: Appliances appear to be in satisfactory location. Persistent
consolidation in the right lung suggesting pneumonia or asymmetric
edema.

## 2017-03-06 IMAGING — DX DG ABDOMEN 1V
1 series · 1 of 1 positions shown · non-contrast
Comparison: None.

CLINICAL DATA: OG tube placement.

EXAM:
ABDOMEN - 1 VIEW

[abdomen kub]
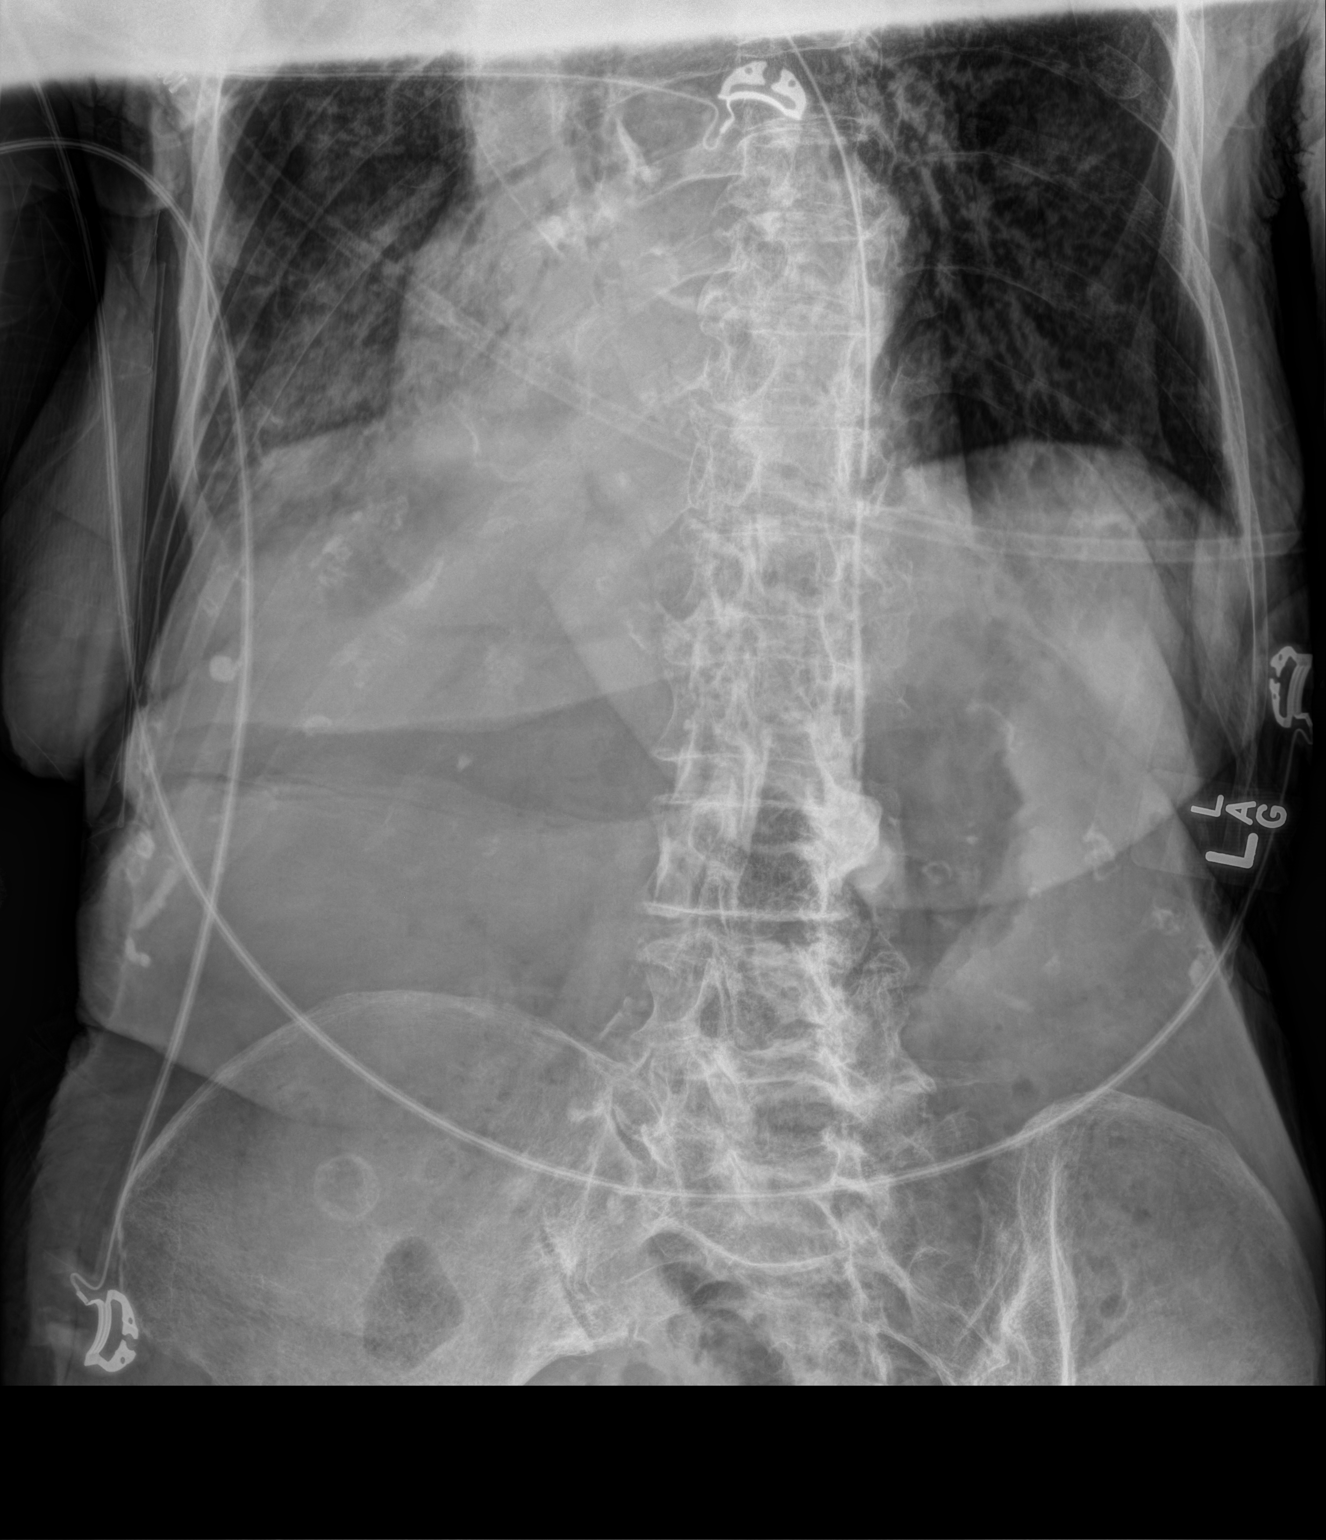

[1 of 1 positions shown; findings below may reference images not displayed]

FINDINGS: The tip of the enteric tube is below the diaphragm likely in the
stomach, the side port is in the region of the gastroesophageal
junction. There is a nonobstructive bowel gas pattern. Lower thorax
is rotated.
IMPRESSION: Tip of the enteric tube below the diaphragm, side-port in the region
of the gastroesophageal junction. Advancement of 3-5 cm recommended
for optimal placement.
